# Patient Record
Sex: Female | Born: 1955 | Race: Black or African American | Hispanic: No | Marital: Single | Smoking: Never smoker
Health system: Southern US, Community
[De-identification: ages and names within clinical notes are randomized; demographics above are authoritative.]

## PROBLEM LIST (undated history)

## (undated) DIAGNOSIS — M1990 Unspecified osteoarthritis, unspecified site: Secondary | ICD-10-CM

## (undated) DIAGNOSIS — R51 Headache: Secondary | ICD-10-CM

## (undated) DIAGNOSIS — I639 Cerebral infarction, unspecified: Secondary | ICD-10-CM

## (undated) DIAGNOSIS — R519 Headache, unspecified: Secondary | ICD-10-CM

## (undated) DIAGNOSIS — I10 Essential (primary) hypertension: Secondary | ICD-10-CM

## (undated) HISTORY — DX: Cerebral infarction, unspecified: I63.9

## (undated) HISTORY — PX: ENDOMETRIAL BIOPSY: PRO73

## (undated) HISTORY — PX: COLPOSCOPY W/ BIOPSY / CURETTAGE: SUR283

## (undated) HISTORY — PX: EYE SURGERY: SHX253

---

## 2012-05-03 ENCOUNTER — Encounter (HOSPITAL_BASED_OUTPATIENT_CLINIC_OR_DEPARTMENT_OTHER): Payer: Self-pay | Admitting: *Deleted

## 2012-05-03 ENCOUNTER — Emergency Department (HOSPITAL_BASED_OUTPATIENT_CLINIC_OR_DEPARTMENT_OTHER)
Admission: EM | Admit: 2012-05-03 | Discharge: 2012-05-03 | Disposition: A | Discharge status: Home / Self Care | Payer: Self-pay | Attending: Emergency Medicine | Admitting: Emergency Medicine

## 2012-05-03 ENCOUNTER — Emergency Department (HOSPITAL_BASED_OUTPATIENT_CLINIC_OR_DEPARTMENT_OTHER): Payer: Self-pay

## 2012-05-03 DIAGNOSIS — S2231XA Fracture of one rib, right side, initial encounter for closed fracture: Secondary | ICD-10-CM

## 2012-05-03 DIAGNOSIS — S298XXA Other specified injuries of thorax, initial encounter: Secondary | ICD-10-CM | POA: Insufficient documentation

## 2012-05-03 DIAGNOSIS — W19XXXA Unspecified fall, initial encounter: Secondary | ICD-10-CM

## 2012-05-03 DIAGNOSIS — S2239XA Fracture of one rib, unspecified side, initial encounter for closed fracture: Secondary | ICD-10-CM | POA: Insufficient documentation

## 2012-05-03 DIAGNOSIS — Y9289 Other specified places as the place of occurrence of the external cause: Secondary | ICD-10-CM | POA: Insufficient documentation

## 2012-05-03 DIAGNOSIS — W108XXA Fall (on) (from) other stairs and steps, initial encounter: Secondary | ICD-10-CM | POA: Insufficient documentation

## 2012-05-03 DIAGNOSIS — Y9389 Activity, other specified: Secondary | ICD-10-CM | POA: Insufficient documentation

## 2012-05-03 LAB — URINALYSIS, ROUTINE W REFLEX MICROSCOPIC
Glucose, UA: NEGATIVE mg/dL
Hgb urine dipstick: NEGATIVE
Ketones, ur: NEGATIVE mg/dL
Protein, ur: NEGATIVE mg/dL
pH: 7 (ref 5.0–8.0)

## 2012-05-03 LAB — URINE MICROSCOPIC-ADD ON

## 2012-05-03 MED ORDER — OXYCODONE-ACETAMINOPHEN 5-325 MG PO TABS: 1.0000 | ORAL_TABLET | Freq: Four times a day (QID) | ORAL | Status: DC | PRN

## 2012-05-03 MED ORDER — OXYCODONE-ACETAMINOPHEN 5-325 MG PO TABS
1.0000 | ORAL_TABLET | Freq: Once | ORAL | Status: AC
  Administered 2012-05-03: 1 via ORAL
  Filled 2012-05-03 (×2): qty 1

## 2012-05-03 MED ORDER — IBUPROFEN 800 MG PO TABS
800.0000 mg | ORAL_TABLET | Freq: Once | ORAL | Status: AC
  Administered 2012-05-03: 800 mg via ORAL
  Filled 2012-05-03: qty 1

## 2012-05-03 NOTE — ED Provider Notes (Signed)
History  This chart was scribed for Sandra Chick, MD by Shari Heritage, ED Scribe. The patient was seen in room MH09/MH09. Patient's care was started at 1747.   CSN: [id number]  Arrival date & time 05/03/12  1647   First MD Initiated Contact with Patient 05/03/12 1747      Chief Complaint  Patient presents with  . Fall    Patient is a 57 y.o. female presenting with fall.  Fall The accident occurred 3 to 5 hours ago. Fall occurred: while descending stairs. She fell from an unknown height. She landed on a hard floor. There was no blood loss. Pain location: right lower back. Pain severity now: moderate to severe. She was ambulatory at the scene. There was no entrapment after the fall. There was no drug use involved in the accident. There was no alcohol use involved in the accident. Pertinent negatives include no vomiting and no headaches. She has tried nothing for the symptoms.    HPI Comments: Sandra [name] is a 57 y.o. female who presents to the Emergency Department complaining of a fall that occurred about 4 hours ago. She is complaining of constant, moderate to severe right rib pain Patient reported to her friend that after she fell, she had some difficulty breathing, but this has improved. She denies head injury, loss of consciousness, seizures, neck pain or vomiting. Patient hasn't taken any medicines for pain. She does not smoke or use alcohol. She has no chronic medical conditions.   History reviewed. No pertinent past medical history.  History reviewed. No pertinent past surgical history.  No family history on file.  History  Substance Use Topics  . Smoking status: Never Smoker   . Smokeless tobacco: Not on file  . Alcohol Use: No    OB History   Grav Para Term Preterm Abortions TAB SAB Ect Mult Living                  Review of Systems  HENT: Negative for neck pain.   Gastrointestinal: Negative for vomiting.  Musculoskeletal: Positive for back pain.  Neurological:  Negative for seizures and headaches.  All other systems reviewed and are negative.    Allergies  Review of patient's allergies indicates no known allergies.  Home Medications  No current outpatient prescriptions on file.  Triage Vitals: BP 196/95  Pulse 71  Temp(Src) 99 F (37.2 C) (Oral)  Resp 20  Wt 123 lb (55.792 kg)  SpO2 100%  Physical Exam  Constitutional: She is oriented to person, place, and time. She appears well-developed and well-nourished.  HENT:  Head: Normocephalic and atraumatic.  Eyes: Conjunctivae and EOM are normal. Pupils are equal, round, and reactive to light.  Neck: Normal range of motion. Neck supple.  Cardiovascular: Normal rate, regular rhythm and normal heart sounds.   Pulmonary/Chest: Breath sounds normal. No respiratory distress. She exhibits no crepitus.  Point tenderness of the right ribs over the mid axillary line.  Abdominal: Soft. There is no tenderness.  Musculoskeletal: She exhibits no edema.  Neurological: She is alert and oriented to person, place, and time.  Skin: Skin is warm and dry.    ED Course  Procedures (including critical care time) DIAGNOSTIC STUDIES: Oxygen Saturation is 100% on room air, normal by my interpretation.    COORDINATION OF CARE: 6:07 PM- Patient informed of current plan for treatment and evaluation and agrees with plan at this time.    Labs Reviewed  URINALYSIS, ROUTINE W REFLEX MICROSCOPIC - Abnormal;  Notable for the following:    Leukocytes, UA MODERATE (*)    All other components within normal limits  URINE MICROSCOPIC-ADD ON - Abnormal; Notable for the following:    Bacteria, UA FEW (*)    All other components within normal limits  URINE CULTURE    Imaging Studies: Dg Ribs Unilateral W/chest Right  05/03/2012  *RADIOLOGY REPORT*  Clinical Data: Fall.  Right axillary pain.  Right rib pain.  RIGHT RIBS AND CHEST - 3+ VIEW  Comparison: Lumbar spine radiographs today.  Findings: No pneumothorax.  The  cardiopericardial silhouette appears mildly enlarged however this may be due to lower lung volumes.  Tortuous thoracic aorta.  No airspace disease.  No pleural effusion.  Faintly visualized right posterior 11th rib fracture.  This is better seen on lumbar spine radiographs.  IMPRESSION: Minimally displaced right lateral 11th rib fracture.  No pneumothorax.  Enlargement of the cardiopericardial silhouette likely secondary to low volumes.   Original Report Authenticated By: Andreas Newport, M.D.    Dg Lumbar Spine Complete  05/03/2012  *RADIOLOGY REPORT*  Clinical Data: Fall.  Right lower axillary rib pain.  Low back pain.  LUMBAR SPINE - COMPLETE 4+ VIEW  Comparison: 05/03/2012 rib series.  Findings:  There is a right lateral 11th rib fracture which is minimally displaced.  This is better seen on this lumbar spine series than on the rib series today.  Vertebral body height is preserved.  Lumbar spinal alignment is anatomic.  Suboptimal oblique views.  No gross pars defect or spondylolisthesis.  Facet joints appear within normal limits.  Lumbosacral junction appears normal.  Mild degenerative disc and facet disease in the lower lumbar spine.  IMPRESSION: Mild lumbar spondylosis.  No acute lumbar spine abnormality.  Minimally displaced right lateral 11th rib fracture.   Original Report Authenticated By: Andreas Newport, M.D.      1. Fall, initial encounter   2. Rib fracture, right, closed, initial encounter       MDM  Pt presenting with right flank and ribcage pain after fall today.  Nondisplaced right fib fracture on xray, no pTx or HTX.  Pt in no acute distress.  Pt treated for pain, given incentive spirometer.  Discharged with strict return precautions.  Pt agreeable with plan.    I personally performed the services described in this documentation, which was scribed in my presence. The recorded information has been reviewed and is accurate.    Sandra Chick, MD 05/03/12 2102

## 2012-05-03 NOTE — ED Notes (Addendum)
Slipped and fell down 4 steps. Pain in her right flank.  BP noted. Daughter states she has not taken any BP medication x 3 months after coming here from [place].

## 2012-05-05 LAB — URINE CULTURE

## 2014-12-07 ENCOUNTER — Encounter (HOSPITAL_BASED_OUTPATIENT_CLINIC_OR_DEPARTMENT_OTHER): Payer: Self-pay | Admitting: *Deleted

## 2014-12-07 ENCOUNTER — Emergency Department (HOSPITAL_COMMUNITY): Payer: 59

## 2014-12-07 ENCOUNTER — Emergency Department (HOSPITAL_BASED_OUTPATIENT_CLINIC_OR_DEPARTMENT_OTHER): Payer: 59

## 2014-12-07 ENCOUNTER — Inpatient Hospital Stay (HOSPITAL_BASED_OUTPATIENT_CLINIC_OR_DEPARTMENT_OTHER)
Admission: EM | Admit: 2014-12-07 | Discharge: 2014-12-09 | DRG: 065 | Disposition: A | Discharge status: Home / Self Care | Payer: 59 | Attending: Internal Medicine | Admitting: Internal Medicine

## 2014-12-07 DIAGNOSIS — R531 Weakness: Secondary | ICD-10-CM | POA: Diagnosis not present

## 2014-12-07 DIAGNOSIS — I10 Essential (primary) hypertension: Secondary | ICD-10-CM | POA: Diagnosis present

## 2014-12-07 DIAGNOSIS — E785 Hyperlipidemia, unspecified: Secondary | ICD-10-CM | POA: Insufficient documentation

## 2014-12-07 DIAGNOSIS — I6350 Cerebral infarction due to unspecified occlusion or stenosis of unspecified cerebral artery: Secondary | ICD-10-CM

## 2014-12-07 DIAGNOSIS — M6289 Other specified disorders of muscle: Secondary | ICD-10-CM | POA: Diagnosis not present

## 2014-12-07 DIAGNOSIS — I639 Cerebral infarction, unspecified: Principal | ICD-10-CM | POA: Diagnosis present

## 2014-12-07 DIAGNOSIS — R29810 Facial weakness: Secondary | ICD-10-CM | POA: Diagnosis present

## 2014-12-07 DIAGNOSIS — G8194 Hemiplegia, unspecified affecting left nondominant side: Secondary | ICD-10-CM | POA: Diagnosis present

## 2014-12-07 DIAGNOSIS — I669 Occlusion and stenosis of unspecified cerebral artery: Secondary | ICD-10-CM | POA: Diagnosis present

## 2014-12-07 HISTORY — DX: Essential (primary) hypertension: I10

## 2014-12-07 HISTORY — DX: Unspecified osteoarthritis, unspecified site: M19.90

## 2014-12-07 HISTORY — DX: Headache: R51

## 2014-12-07 HISTORY — DX: Headache, unspecified: R51.9

## 2014-12-07 LAB — URINALYSIS, ROUTINE W REFLEX MICROSCOPIC
BILIRUBIN URINE: NEGATIVE
GLUCOSE, UA: NEGATIVE mg/dL
Hgb urine dipstick: NEGATIVE
KETONES UR: NEGATIVE mg/dL
NITRITE: NEGATIVE
PH: 7.5 (ref 5.0–8.0)
PROTEIN: NEGATIVE mg/dL
Specific Gravity, Urine: 1.006 (ref 1.005–1.030)

## 2014-12-07 LAB — COMPREHENSIVE METABOLIC PANEL
ALT: 17 U/L (ref 14–54)
AST: 25 U/L (ref 15–41)
Albumin: 4.2 g/dL (ref 3.5–5.0)
Alkaline Phosphatase: 64 U/L (ref 38–126)
Anion gap: 6 (ref 5–15)
BILIRUBIN TOTAL: 0.7 mg/dL (ref 0.3–1.2)
BUN: 10 mg/dL (ref 6–20)
CHLORIDE: 102 mmol/L (ref 101–111)
CO2: 26 mmol/L (ref 22–32)
Calcium: 9.4 mg/dL (ref 8.9–10.3)
Creatinine, Ser: 0.79 mg/dL (ref 0.44–1.00)
Glucose, Bld: 126 mg/dL — ABNORMAL HIGH (ref 65–99)
POTASSIUM: 4.4 mmol/L (ref 3.5–5.1)
Sodium: 134 mmol/L — ABNORMAL LOW (ref 135–145)
TOTAL PROTEIN: 9.3 g/dL — AB (ref 6.5–8.1)

## 2014-12-07 LAB — CBC
HEMATOCRIT: 40 % (ref 36.0–46.0)
Hemoglobin: 13.2 g/dL (ref 12.0–15.0)
MCH: 28.8 pg (ref 26.0–34.0)
MCHC: 33 g/dL (ref 30.0–36.0)
MCV: 87.3 fL (ref 78.0–100.0)
PLATELETS: 201 10*3/uL (ref 150–400)
RBC: 4.58 MIL/uL (ref 3.87–5.11)
RDW: 12.9 % (ref 11.5–15.5)
WBC: 7.7 10*3/uL (ref 4.0–10.5)

## 2014-12-07 LAB — URINE MICROSCOPIC-ADD ON

## 2014-12-07 LAB — CBG MONITORING, ED: GLUCOSE-CAPILLARY: 108 mg/dL — AB (ref 65–99)

## 2014-12-07 LAB — TROPONIN I

## 2014-12-07 MED ORDER — ASPIRIN EC 81 MG PO TBEC
81.0000 mg | DELAYED_RELEASE_TABLET | Freq: Every day | ORAL | Status: DC
  Administered 2014-12-07 – 2014-12-09 (×3): 81 mg via ORAL
  Filled 2014-12-07 (×3): qty 1

## 2014-12-07 MED ORDER — HYDROCHLOROTHIAZIDE 25 MG PO TABS
25.0000 mg | ORAL_TABLET | Freq: Once | ORAL | Status: AC
  Administered 2014-12-07: 25 mg via ORAL
  Filled 2014-12-07: qty 1

## 2014-12-07 MED ORDER — GADOBENATE DIMEGLUMINE 529 MG/ML IV SOLN
10.0000 mL | Freq: Once | INTRAVENOUS | Status: AC | PRN
  Administered 2014-12-07: 10 mL via INTRAVENOUS

## 2014-12-07 MED ORDER — ASPIRIN EC 81 MG PO TBEC: 81.0000 mg | DELAYED_RELEASE_TABLET | Freq: Every day | ORAL | Status: DC

## 2014-12-07 NOTE — ED Provider Notes (Signed)
CSN: [id number]     Arrival date & time 12/07/14  1426 History   First MD Initiated Contact with Patient 12/07/14 1503     Chief Complaint  Patient presents with  . Weakness     (Consider location/radiation/quality/duration/timing/severity/associated sxs/prior Treatment) Patient is a 59 y.o. female presenting with Acute Neurological Problem. The history is provided by a relative and medical records. The history is limited by a language barrier. Language interpreter used: family.  Cerebrovascular Accident This is a new problem. Episode onset: 4 days ago. The problem occurs constantly. The problem has not changed since onset.Pertinent negatives include no chest pain, no abdominal pain, no headaches and no shortness of breath. Nothing aggravates the symptoms. Nothing relieves the symptoms. She has tried nothing for the symptoms. The treatment provided no relief.    History reviewed. No pertinent past medical history. History reviewed. No pertinent past surgical history. No family history on file. Social History  Substance Use Topics  . Smoking status: Never Smoker   . Smokeless tobacco: None  . Alcohol Use: No   OB History    No data available     Review of Systems  Constitutional: Negative for fever.  Eyes: Negative for visual disturbance.  Respiratory: Negative for shortness of breath.   Cardiovascular: Negative for chest pain.  Gastrointestinal: Negative for nausea, vomiting and abdominal pain.  Skin: Negative for rash.  Neurological: Positive for dizziness and weakness. Negative for syncope, facial asymmetry, numbness and headaches.      Allergies  Review of patient's allergies indicates no known allergies.  Home Medications   Prior to Admission medications   Medication Sig Start Date End Date Taking? Authorizing Provider  Multiple Vitamins-Calcium (ONE-A-DAY WOMENS PO) Take 1 tablet by mouth daily.   Yes Historical Provider, MD   BP 148/80 mmHg  Pulse 65   Temp(Src) 98.1 F (36.7 C) (Oral)  Resp 19  Ht 5\' 2"  (1.575 m)  Wt 122 lb (55.339 kg)  BMI 22.31 kg/m2  SpO2 100% Physical Exam  Constitutional: She is oriented to person, place, and time. She appears well-developed and well-nourished. No distress.  HENT:  Head: Normocephalic and atraumatic.  Eyes: Conjunctivae and EOM are normal.  Neck: Normal range of motion.  Cardiovascular: Normal rate, regular rhythm, normal heart sounds and intact distal pulses.  Exam reveals no gallop and no friction rub.   No murmur heard. Pulmonary/Chest: Effort normal and breath sounds normal. No respiratory distress. She has no wheezes. She has no rales.  Abdominal: Soft. She exhibits no distension. There is no tenderness. There is no guarding.  Musculoskeletal: She exhibits no edema or tenderness.  Neurological: She is alert and oriented to person, place, and time.  Skin: Skin is warm and dry. No rash noted. She is not diaphoretic. No erythema.  Nursing note and vitals reviewed.   ED Course  Procedures (including critical care time) Labs Review Labs Reviewed  COMPREHENSIVE METABOLIC PANEL - Abnormal; Notable for the following:    Sodium 134 (*)    Glucose, Bld 126 (*)    Total Protein 9.3 (*)    All other components within normal limits  URINALYSIS, ROUTINE W REFLEX MICROSCOPIC (NOT AT [facility]) - Abnormal; Notable for the following:    Leukocytes, UA TRACE (*)    All other components within normal limits  URINE MICROSCOPIC-ADD ON - Abnormal; Notable for the following:    Squamous Epithelial / LPF 0-5 (*)    Bacteria, UA RARE (*)    All other  components within normal limits  LIPID PANEL - Abnormal; Notable for the following:    Cholesterol 231 (*)    HDL 35 (*)    LDL Cholesterol 170 (*)    All other components within normal limits  CBG MONITORING, ED - Abnormal; Notable for the following:    Glucose-Capillary 108 (*)    All other components within normal limits  CBC  TROPONIN I  HEMOGLOBIN  A1C    Imaging Review Dg Chest 2 View  12/07/2014  CLINICAL DATA:  Dizziness, weakness left side of the body for 4 days EXAM: CHEST  2 VIEW COMPARISON:  None. FINDINGS: Cardiomediastinal silhouette is unremarkable. No acute infiltrate or pleural effusion. No pulmonary edema. Bony thorax is unremarkable. IMPRESSION: No active cardiopulmonary disease. Electronically Signed   By: Lahoma Crocker M.D.   On: 12/07/2014 16:12   Ct Head Wo Contrast  12/07/2014  CLINICAL DATA:  Dizziness, weakness, left side. EXAM: CT HEAD WITHOUT CONTRAST TECHNIQUE: Contiguous axial images were obtained from the base of the skull through the vertex without intravenous contrast. COMPARISON:  None. FINDINGS: There is no evidence of mass effect, midline shift, or extra-axial fluid collections. There is no evidence of a space-occupying lesion or intracranial hemorrhage. There is no evidence of a cortical-based area of acute infarction. There is generalized cerebral atrophy. There is periventricular white matter low attenuation likely secondary to microangiopathy. The ventricles and sulci are appropriate for the patient's age. The basal cisterns are patent. Visualized portions of the orbits are unremarkable. The visualized portions of the paranasal sinuses and mastoid air cells are unremarkable. The osseous structures are unremarkable. IMPRESSION: No acute intracranial pathology. Electronically Signed   By: Kathreen Devoid   On: 12/07/2014 16:14   [name] Head Wo Contrast  12/07/2014  CLINICAL DATA:  Four-day history of gradual onset LEFT extremity and facial weakness, dizziness, imbalance and transient slurred speech. Similar symptoms 2 months ago which resolved. EXAM: MRI HEAD WITHOUT AND WITH CONTRAST MRA HEAD WITHOUT CONTRAST TECHNIQUE: Multiplanar, multiecho pulse sequences of the brain and surrounding structures were obtained without and with intravenous contrast. Angiographic images of the head were obtained using MRA technique without  contrast. CONTRAST:  62mL MULTIHANCE GADOBENATE DIMEGLUMINE 529 MG/ML IV SOLN COMPARISON:  CT head December 07, 2014 at 1556 hours FINDINGS: MRI HEAD FINDINGS 9 mm ovoid focus of reduced diffusion posterior limb of RIGHT internal capsule, corresponding low ADC values. Faint associated FLAIR T2 hyperintense signal, no associated enhancement. The ventricles and sulci are normal for patient's age, cavum septum pellucidum et vergae, normal variant. No abnormal parenchymal signal, mass lesions, mass effect. Faint T2 hyperintense signal within the pons. No abnormal parenchymal enhancement. No susceptibility artifact to suggest hemorrhage. No abnormal extra-axial fluid collections. No extra-axial masses nor leptomeningeal enhancement. Normal major intracranial vascular flow voids seen at the skull base. Ocular globes and orbital contents are unremarkable though not tailored for evaluation. No suspicious calvarial bone marrow signal. Mildly expanded fluid filled sella compatible with empty sella, thin rind of pituitary tissue along the floor. Craniocervical junction maintained. Mild LEFT maxillary sinus mucosal thickening without paranasal sinus air-fluid levels. The mastoid air cells are well aerated. MRA HEAD FINDINGS Moderately motion degraded examination. Anterior circulation: Normal flow related enhancement of the included cervical, petrous, cavernous and supra clinoid internal carotid arteries. Patent anterior communicating artery. Normal flow related enhancement of the anterior and middle cerebral arteries, including more distal segments. Moderate stenosis distal RIGHT M1 segment. No large vessel occlusion, high-grade stenosis,  abnormal luminal irregularity, aneurysm. Posterior circulation: LEFT vertebral artery is dominant. Basilar artery is patent, with normal flow related enhancement of the main branch vessels. Fetal origin bilateral posterior cerebral arteries. LEFT posterior communicating artery origin  infundibulum. Normal flow related enhancement of the posterior cerebral arteries. No large vessel occlusion, high-grade stenosis, abnormal luminal irregularity, aneurysm. IMPRESSION: MRI HEAD: Acute subcentimeter infarct RIGHT posterior limb of the internal capsule (lenticulostriate territory, typically arising from M1). Mild pontine white matter changes compatible with chronic small vessel ischemic disease. Empty sella. MRA HEAD: Moderate stenosis RIGHT M1 segment. No acute large vessel occlusion. Complete circle of Willis. Electronically Signed   By: Elon Alas M.D.   On: 12/07/2014 21:05   Mr Jeri Cos [id number] Contrast  12/07/2014  CLINICAL DATA:  Four-day history of gradual onset LEFT extremity and facial weakness, dizziness, imbalance and transient slurred speech. Similar symptoms 2 months ago which resolved. EXAM: MRI HEAD WITHOUT AND WITH CONTRAST MRA HEAD WITHOUT CONTRAST TECHNIQUE: Multiplanar, multiecho pulse sequences of the brain and surrounding structures were obtained without and with intravenous contrast. Angiographic images of the head were obtained using MRA technique without contrast. CONTRAST:  12mL MULTIHANCE GADOBENATE DIMEGLUMINE 529 MG/ML IV SOLN COMPARISON:  CT head December 07, 2014 at 1556 hours FINDINGS: MRI HEAD FINDINGS 9 mm ovoid focus of reduced diffusion posterior limb of RIGHT internal capsule, corresponding low ADC values. Faint associated FLAIR T2 hyperintense signal, no associated enhancement. The ventricles and sulci are normal for patient's age, cavum septum pellucidum et vergae, normal variant. No abnormal parenchymal signal, mass lesions, mass effect. Faint T2 hyperintense signal within the pons. No abnormal parenchymal enhancement. No susceptibility artifact to suggest hemorrhage. No abnormal extra-axial fluid collections. No extra-axial masses nor leptomeningeal enhancement. Normal major intracranial vascular flow voids seen at the skull base. Ocular globes and orbital  contents are unremarkable though not tailored for evaluation. No suspicious calvarial bone marrow signal. Mildly expanded fluid filled sella compatible with empty sella, thin rind of pituitary tissue along the floor. Craniocervical junction maintained. Mild LEFT maxillary sinus mucosal thickening without paranasal sinus air-fluid levels. The mastoid air cells are well aerated. MRA HEAD FINDINGS Moderately motion degraded examination. Anterior circulation: Normal flow related enhancement of the included cervical, petrous, cavernous and supra clinoid internal carotid arteries. Patent anterior communicating artery. Normal flow related enhancement of the anterior and middle cerebral arteries, including more distal segments. Moderate stenosis distal RIGHT M1 segment. No large vessel occlusion, high-grade stenosis, abnormal luminal irregularity, aneurysm. Posterior circulation: LEFT vertebral artery is dominant. Basilar artery is patent, with normal flow related enhancement of the main branch vessels. Fetal origin bilateral posterior cerebral arteries. LEFT posterior communicating artery origin infundibulum. Normal flow related enhancement of the posterior cerebral arteries. No large vessel occlusion, high-grade stenosis, abnormal luminal irregularity, aneurysm. IMPRESSION: MRI HEAD: Acute subcentimeter infarct RIGHT posterior limb of the internal capsule (lenticulostriate territory, typically arising from M1). Mild pontine white matter changes compatible with chronic small vessel ischemic disease. Empty sella. MRA HEAD: Moderate stenosis RIGHT M1 segment. No acute large vessel occlusion. Complete circle of Willis. Electronically Signed   By: Elon Alas M.D.   On: 12/07/2014 21:05   I have personally reviewed and evaluated these images and lab results as part of my medical decision-making.   EKG Interpretation   Date/Time:  Thursday December 07 2014 15:38:30 EST Ventricular Rate:  67 PR Interval:  160 QRS  Duration: 78 QT Interval:  390 QTC Calculation: 412 R Axis:   16 Text  Interpretation:  Normal sinus rhythm Nonspecific ST abnormality  Abnormal ECG No previous ECGs available Confirmed by NGUYEN, EMILY ([id number])  on 12/07/2014 3:43:01 PM        MDM   Final diagnoses:  Cerebrovascular accident (CVA) due to occlusion of cerebral artery [facility])   58 year old female who presents as transfer from [facility] for concern of left-sided weakness of 4 days duration. CT head was unremarkable. Dr. Alfonse Spruce consulted neurology, Dr. Sadie Haber, who had recommended patient come to the emergency department. Consulted neurology on arrival, and MRI was ordered which showed concern for acute subcentimeter infarct of the right posterior limb of the internal capsule.  Patient was admitted to hospitalists for concern of acute CVA.   Gareth Morgan, MD 12/08/14 [phone]

## 2014-12-07 NOTE — ED Notes (Signed)
Comer Locket [id number] Pt daughter  Angelica Ran [phone] Pt niece

## 2014-12-07 NOTE — ED Notes (Signed)
Pt states now that left side weakness started 4 days ago

## 2014-12-07 NOTE — ED Notes (Signed)
Pt arrives via [facility] from [facility]  with c/o neuro symptoms. Pt family reports several days of left sided weakness with pt dropping items from left hand today. They took her to the [facility] where she had a workup and negative CT.Pt here for further work up. Pt arrives alert and oriented(per family). Pt only speaks [place] but is responsive to command.

## 2014-12-07 NOTE — ED Notes (Signed)
Pt speaks Sandra [name]

## 2014-12-07 NOTE — ED Notes (Signed)
MD at bedside. 

## 2014-12-07 NOTE — ED Provider Notes (Signed)
CSN: [id number]     Arrival date & time 12/07/14  1426 History   First MD Initiated Contact with Patient 12/07/14 1503     Chief Complaint  Patient presents with  . Weakness     (Consider location/radiation/quality/duration/timing/severity/associated sxs/prior Treatment) HPI Comments: 59 y.o. Female with no known past medical history presents with her daughter for left sided weakness.  The patient and her daughter report that since Monday the patient has had weakness on the left side of her body.  Her daughter noted yesterday that her mother could not hold food in her left hand and that it was slipping out of her hand.  She denies headache or symptoms like this before.  She also has felt off balance when walking and weakness in the left leg.  Daughter has not noted change in speech or appearance of patient's face or smile and patient denies either as well.  The patient has felt dizzy at times as well.  No fever, chills, chest pain, shortness of breath, nausea, vomiting.  Patient is a 59 y.o. female presenting with weakness.  Weakness Pertinent negatives include no chest pain, no abdominal pain, no headaches and no shortness of breath.    History reviewed. No pertinent past medical history. History reviewed. No pertinent past surgical history. No family history on file. Social History  Substance Use Topics  . Smoking status: Never Smoker   . Smokeless tobacco: None  . Alcohol Use: No   OB History    No data available     Review of Systems  Constitutional: Negative for fever, chills, appetite change and fatigue.  HENT: Negative for congestion, postnasal drip and rhinorrhea.   Respiratory: Negative for cough, chest tightness and shortness of breath.   Cardiovascular: Negative for chest pain, palpitations and leg swelling.  Gastrointestinal: Negative for nausea, abdominal pain and diarrhea.  Genitourinary: Negative for dysuria, urgency, frequency and hematuria.  Musculoskeletal:  Negative for myalgias, back pain, neck pain and neck stiffness.  Skin: Negative for rash.  Neurological: Positive for dizziness, weakness (left arm and leg) and numbness. Negative for seizures, syncope, facial asymmetry, light-headedness and headaches.  Hematological: Does not bruise/bleed easily.      Allergies  Review of patient's allergies indicates no known allergies.  Home Medications   Prior to Admission medications   Not on File   BP 171/89 mmHg  Pulse 65  Temp(Src) 99.5 F (37.5 C) (Oral)  Resp 26  Ht 4\' 11"  (1.499 m)  Wt 122 lb (55.339 kg)  BMI 24.63 kg/m2  SpO2 100% Physical Exam  Constitutional: She is oriented to person, place, and time. She appears well-developed and well-nourished. No distress.  HENT:  Head: Normocephalic and atraumatic.  Right Ear: External ear normal.  Left Ear: External ear normal.  Nose: Nose normal.  Mouth/Throat: Oropharynx is clear and moist. No oropharyngeal exudate.  Eyes: EOM are normal. Pupils are equal, round, and reactive to light.  Neck: Normal range of motion. Neck supple.  Cardiovascular: Normal rate, regular rhythm, normal heart sounds and intact distal pulses.   No murmur heard. Pulmonary/Chest: Effort normal. No respiratory distress. She has no wheezes. She has no rales.  Abdominal: Soft. She exhibits no distension. There is no tenderness.  Musculoskeletal: Normal range of motion. She exhibits no edema or tenderness.  Neurological: She is alert and oriented to person, place, and time. A sensory deficit (reports decreased sensation in left arm and leg compared to right) is present. No cranial nerve deficit. She exhibits  normal muscle tone. Coordination abnormal.  Left hand grasp and plantar flexion slightly decreased from right.  Patient with mild left arm drift.  Patient had difficulty with finger to nose examination when using her left arm.   Skin: Skin is warm and dry. No rash noted. She is not diaphoretic.  Vitals  reviewed.   ED Course  Procedures (including critical care time) Labs Review Labs Reviewed  COMPREHENSIVE METABOLIC PANEL - Abnormal; Notable for the following:    Sodium 134 (*)    Glucose, Bld 126 (*)    Total Protein 9.3 (*)    All other components within normal limits  CBG MONITORING, ED - Abnormal; Notable for the following:    Glucose-Capillary 108 (*)    All other components within normal limits  CBC  TROPONIN I  URINALYSIS, ROUTINE W REFLEX MICROSCOPIC (NOT AT [facility])    Imaging Review Dg Chest 2 View  12/07/2014  CLINICAL DATA:  Dizziness, weakness left side of the body for 4 days EXAM: CHEST  2 VIEW COMPARISON:  None. FINDINGS: Cardiomediastinal silhouette is unremarkable. No acute infiltrate or pleural effusion. No pulmonary edema. Bony thorax is unremarkable. IMPRESSION: No active cardiopulmonary disease. Electronically Signed   By: Lahoma Crocker M.D.   On: 12/07/2014 16:12   Ct Head Wo Contrast  12/07/2014  CLINICAL DATA:  Dizziness, weakness, left side. EXAM: CT HEAD WITHOUT CONTRAST TECHNIQUE: Contiguous axial images were obtained from the base of the skull through the vertex without intravenous contrast. COMPARISON:  None. FINDINGS: There is no evidence of mass effect, midline shift, or extra-axial fluid collections. There is no evidence of a space-occupying lesion or intracranial hemorrhage. There is no evidence of a cortical-based area of acute infarction. There is generalized cerebral atrophy. There is periventricular white matter low attenuation likely secondary to microangiopathy. The ventricles and sulci are appropriate for the patient's age. The basal cisterns are patent. Visualized portions of the orbits are unremarkable. The visualized portions of the paranasal sinuses and mastoid air cells are unremarkable. The osseous structures are unremarkable. IMPRESSION: No acute intracranial pathology. Electronically Signed   By: Kathreen Devoid   On: 12/07/2014 16:14   I have  personally reviewed and evaluated these images and lab results as part of my medical decision-making.   EKG Interpretation   Date/Time:  Thursday December 07 2014 15:38:30 EST Ventricular Rate:  67 PR Interval:  160 QRS Duration: 78 QT Interval:  390 QTC Calculation: 412 R Axis:   16 Text Interpretation:  Normal sinus rhythm Nonspecific ST abnormality  Abnormal ECG No previous ECGs available Confirmed by NGUYEN, EMILY ([id number])  on 12/07/2014 3:43:01 PM      MDM  Patient seen and evaluated in stable condition.  Noted to have left sided weakness on examination.  Concern for possible CVA although today 4th day of symptoms.  Initial work up unremarkable.  Discussed with Dr. Merlene Laughter who recommended an ER to ER transfer and said that neurology would see and evaluate the patient in the ER as deficits not extreme and onset few days ago.  Discussed with Dr. Billy Fischer who agreed with transfer and patient transferred in stable condition.  Patient and family updated on results and plan of care. Final diagnoses:  Left-sided weakness    1. Left sided weakness, concern for CVA    Harvel Quale, MD 12/07/14 [phone]

## 2014-12-07 NOTE — Consult Note (Addendum)
Referring Physician: ED    Chief Complaint: left sided weakness, dizziness, unsteadiness x 4 days  HPI:                                                                                                                                         Sandra [name] is an 59 y.o. female, right handed, with a past medical history significant for HTN, transfer to [facility] for further evaluation of the above stated symptoms. Patient initially presented to [facility] whre she had a CT brain that sowed no acute abnormality and was send to [facility] for neuro evaluation. She is accompanied by a family member that is at the bedside and helps with translation. Patient said that she developed gradual onset of left leg-arm-face weakness 4 days ago, as well as dizziness, imbalance, and transient slurred speech. She stated that she had similar symptoms 2 months ago that resolved 24 hours later. Complains of mild HA but denies double vision, difficulty swallowing, language or vision impairment. No neck pain, fever, or palpitations.  Date last known well: unacertain  Time last known well: uncertain tPA Given: no, late presentation   History reviewed. No pertinent past medical history.  History reviewed. No pertinent past surgical history.  No family history on file. Social History:  reports that she has never smoked. She does not have any smokeless tobacco history on file. She reports that she does not drink alcohol or use illicit drugs.  Allergies: No Known Allergies  Medications:                                                                                                                           I have reviewed the patient's current medications.  ROS:  History obtained from chart review and the patient  General ROS: negative for - chills, fatigue, fever, night sweats, weight gain or  weight loss Psychological ROS: negative for - behavioral disorder, hallucinations, memory difficulties, mood swings or suicidal ideation Ophthalmic ROS: negative for - blurry vision, double vision, eye pain or loss of vision ENT ROS: negative for - epistaxis, nasal discharge, oral lesions, sore throat, tinnitus or vertigo Allergy and Immunology ROS: negative for - hives or itchy/watery eyes Hematological and Lymphatic ROS: negative for - bleeding problems, bruising or swollen lymph nodes Endocrine ROS: negative for - galactorrhea, hair pattern changes, polydipsia/polyuria or temperature intolerance Respiratory ROS: negative for - cough, hemoptysis, shortness of breath or wheezing Cardiovascular ROS: negative for - chest pain, dyspnea on exertion, edema or irregular heartbeat Gastrointestinal ROS: negative for - abdominal pain, diarrhea, hematemesis, nausea/vomiting or stool incontinence Genito-Urinary ROS: negative for - dysuria, hematuria, incontinence or urinary frequency/urgency Musculoskeletal ROS: negative for - joint swelling Neurological ROS: as noted in HPI Dermatological ROS: negative for rash and skin lesion changes     Physical exam:  Constitutional: well developed, pleasant female in no apparent distress. Blood pressure 165/116, pulse 58, temperature 99.3 F (37.4 C), temperature source Oral, resp. rate 16, height '5\' 2"'  (1.575 m), weight 55.339 kg (122 lb), SpO2 98 %. Eyes: no jaundice or exophthalmos.  Head: normocephalic. Neck: supple, no bruits, no JVD. Cardiac: no murmurs. Lungs: clear. Abdomen: soft, no tender, no mass. Extremities: no edema, clubbing, or cyanosis.  Skin: no rash  Neurologic Examination:                                                                                                      General: NAD Mental Status: Alert, oriented, thought content appropriate.  Speech fluent without evidence of aphasia.  Able to follow 3 step commands without  difficulty. Cranial Nerves: II: Discs flat bilaterally; Visual fields grossly normal, pupils equal, round, reactive to light and accommodation III,IV, VI: ptosis not present, extra-ocular motions intact bilaterally V,VII: smile asymmetric due to mild left lower face weakness, facial light touch sensation normal bilaterally VIII: hearing normal bilaterally IX,X: uvula rises symmetrically XI: bilateral shoulder shrug XII: midline tongue extension without atrophy or fasciculations Motor: Subtle decreased RAM left hand. Tone and bulk:normal tone throughout; no atrophy noted Sensory: Pinprick and light touch intact throughout, bilaterally Deep Tendon Reflexes:  1+ all over Plantars: Right: downgoing   Left: downgoing Cerebellar: normal finger-to-nose and normal heel-to-shin test in the right. Mildly impaired heel to shin in the left but ok FTN. Gait:  No tested due to multiple leads    Results for orders placed or performed during the hospital encounter of 12/07/14 (from the past 48 hour(s))  CBC     Status: None   Collection Time: 12/07/14  2:55 PM  Result Value Ref Range   WBC 7.7 4.0 - 10.5 K/uL   RBC 4.58 3.87 - 5.11 MIL/uL   Hemoglobin 13.2 12.0 - 15.0 g/dL   HCT 40.0 36.0 - 46.0 %   MCV 87.3 78.0 - 100.0 fL  MCH 28.8 26.0 - 34.0 pg   MCHC 33.0 30.0 - 36.0 g/dL   RDW 12.9 11.5 - 15.5 %   Platelets 201 150 - 400 K/uL  Comprehensive metabolic panel     Status: Abnormal   Collection Time: 12/07/14  2:55 PM  Result Value Ref Range   Sodium 134 (L) 135 - 145 mmol/L   Potassium 4.4 3.5 - 5.1 mmol/L   Chloride 102 101 - 111 mmol/L   CO2 26 22 - 32 mmol/L   Glucose, Bld 126 (H) 65 - 99 mg/dL   BUN 10 6 - 20 mg/dL   Creatinine, Ser 0.79 0.44 - 1.00 mg/dL   Calcium 9.4 8.9 - 10.3 mg/dL   Total Protein 9.3 (H) 6.5 - 8.1 g/dL   Albumin 4.2 3.5 - 5.0 g/dL   AST 25 15 - 41 U/L   ALT 17 14 - 54 U/L   Alkaline Phosphatase 64 38 - 126 U/L   Total Bilirubin 0.7 0.3 - 1.2 mg/dL    GFR calc non Af Amer >60 >60 mL/min   GFR calc Af Amer >60 >60 mL/min    Comment: (NOTE) The eGFR has been calculated using the CKD EPI equation. This calculation has not been validated in all clinical situations. eGFR's persistently <60 mL/min signify possible Chronic Kidney Disease.    Anion gap 6 5 - 15  Troponin I     Status: None   Collection Time: 12/07/14  2:55 PM  Result Value Ref Range   Troponin I <0.03 <0.031 ng/mL    Comment:        NO INDICATION OF MYOCARDIAL INJURY.   POC CBG, ED     Status: Abnormal   Collection Time: 12/07/14  2:57 PM  Result Value Ref Range   Glucose-Capillary 108 (H) 65 - 99 mg/dL  Urinalysis, Routine w reflex microscopic (not at [facility])     Status: Abnormal   Collection Time: 12/07/14  4:45 PM  Result Value Ref Range   Color, Urine YELLOW YELLOW   APPearance CLEAR CLEAR   Specific Gravity, Urine 1.006 1.005 - 1.030   pH 7.5 5.0 - 8.0   Glucose, UA NEGATIVE NEGATIVE mg/dL   Hgb urine dipstick NEGATIVE NEGATIVE   Bilirubin Urine NEGATIVE NEGATIVE   Ketones, ur NEGATIVE NEGATIVE mg/dL   Protein, ur NEGATIVE NEGATIVE mg/dL   Nitrite NEGATIVE NEGATIVE   Leukocytes, UA TRACE (A) NEGATIVE  Urine microscopic-add on     Status: Abnormal   Collection Time: 12/07/14  4:45 PM  Result Value Ref Range   Squamous Epithelial / LPF 0-5 (A) NONE SEEN   WBC, UA 0-5 0 - 5 WBC/hpf   RBC / HPF 0-5 0 - 5 RBC/hpf   Bacteria, UA RARE (A) NONE SEEN   Urine-Other MUCOUS PRESENT    Dg Chest 2 View  12/07/2014  CLINICAL DATA:  Dizziness, weakness left side of the body for 4 days EXAM: CHEST  2 VIEW COMPARISON:  None. FINDINGS: Cardiomediastinal silhouette is unremarkable. No acute infiltrate or pleural effusion. No pulmonary edema. Bony thorax is unremarkable. IMPRESSION: No active cardiopulmonary disease. Electronically Signed   By: Lahoma Crocker M.D.   On: 12/07/2014 16:12   Ct Head Wo Contrast  12/07/2014  CLINICAL DATA:  Dizziness, weakness, left side. EXAM: CT  HEAD WITHOUT CONTRAST TECHNIQUE: Contiguous axial images were obtained from the base of the skull through the vertex without intravenous contrast. COMPARISON:  None. FINDINGS: There is no evidence of mass effect, midline  shift, or extra-axial fluid collections. There is no evidence of a space-occupying lesion or intracranial hemorrhage. There is no evidence of a cortical-based area of acute infarction. There is generalized cerebral atrophy. There is periventricular white matter low attenuation likely secondary to microangiopathy. The ventricles and sulci are appropriate for the patient's age. The basal cisterns are patent. Visualized portions of the orbits are unremarkable. The visualized portions of the paranasal sinuses and mastoid air cells are unremarkable. The osseous structures are unremarkable. IMPRESSION: No acute intracranial pathology. Electronically Signed   By: Kathreen Devoid   On: 12/07/2014 16:14    Assessment: 59 y.o. female transfer to [facility] for evaluation of left sided weakness, dizziness, imbalance x 4 days. Subtle decreased rapid alternating movements left hand, mild lef lower face weakness, perhaps slightly impaired left heel to shin. CT brain without acute abnormality. Concern for right subcortical subacute infarct in spite of CT brain negative 4 days after onset of symptoms. Admit to medicine. Ordered MRI/MRA brain to better elucidate her syndrome. Further stroke work up if MRI +.  PT. ASA. Will follow up.   Stroke Risk Factors - HTN    Dorian Pod, MD [facility] [phone]  12/07/2014, 7:30 PM 9. PT/OT SLP

## 2014-12-07 NOTE — ED Notes (Signed)
Dizziness. Weakness on the left side of her body since yesterday.

## 2014-12-07 NOTE — ED Notes (Signed)
Patient transported to MRI 

## 2014-12-08 ENCOUNTER — Encounter (HOSPITAL_COMMUNITY): Payer: Self-pay | Admitting: *Deleted

## 2014-12-08 ENCOUNTER — Inpatient Hospital Stay (HOSPITAL_COMMUNITY): Payer: 59

## 2014-12-08 DIAGNOSIS — M6289 Other specified disorders of muscle: Secondary | ICD-10-CM | POA: Diagnosis not present

## 2014-12-08 DIAGNOSIS — I6350 Cerebral infarction due to unspecified occlusion or stenosis of unspecified cerebral artery: Secondary | ICD-10-CM | POA: Insufficient documentation

## 2014-12-08 DIAGNOSIS — G8194 Hemiplegia, unspecified affecting left nondominant side: Secondary | ICD-10-CM | POA: Diagnosis present

## 2014-12-08 DIAGNOSIS — I6789 Other cerebrovascular disease: Secondary | ICD-10-CM

## 2014-12-08 DIAGNOSIS — I63311 Cerebral infarction due to thrombosis of right middle cerebral artery: Secondary | ICD-10-CM | POA: Diagnosis not present

## 2014-12-08 DIAGNOSIS — E785 Hyperlipidemia, unspecified: Secondary | ICD-10-CM | POA: Diagnosis present

## 2014-12-08 DIAGNOSIS — I10 Essential (primary) hypertension: Secondary | ICD-10-CM | POA: Diagnosis present

## 2014-12-08 DIAGNOSIS — R531 Weakness: Secondary | ICD-10-CM | POA: Diagnosis present

## 2014-12-08 DIAGNOSIS — R29810 Facial weakness: Secondary | ICD-10-CM | POA: Diagnosis present

## 2014-12-08 DIAGNOSIS — I639 Cerebral infarction, unspecified: Secondary | ICD-10-CM | POA: Diagnosis present

## 2014-12-08 DIAGNOSIS — I669 Occlusion and stenosis of unspecified cerebral artery: Secondary | ICD-10-CM | POA: Diagnosis present

## 2014-12-08 LAB — VITAMIN B12: VITAMIN B 12: 1282 pg/mL — AB (ref 180–914)

## 2014-12-08 LAB — LIPID PANEL
CHOLESTEROL: 231 mg/dL — AB (ref 0–200)
HDL: 35 mg/dL — AB (ref >40)
LDL Cholesterol: 170 mg/dL — ABNORMAL HIGH (ref 0–99)
TRIGLYCERIDES: 128 mg/dL (ref <150)
Total CHOL/HDL Ratio: 6.6 RATIO
VLDL: 26 mg/dL (ref 0–40)

## 2014-12-08 LAB — HEMOGLOBIN A1C
HEMOGLOBIN A1C: 6.1 % — AB (ref 4.8–5.6)
MEAN PLASMA GLUCOSE: 128 mg/dL

## 2014-12-08 LAB — TSH: TSH: 1.772 u[IU]/mL (ref 0.350–4.500)

## 2014-12-08 MED ORDER — SENNOSIDES-DOCUSATE SODIUM 8.6-50 MG PO TABS
1.0000 | ORAL_TABLET | Freq: Every evening | ORAL | Status: DC | PRN
  Filled 2014-12-08: qty 1

## 2014-12-08 MED ORDER — ENSURE ENLIVE PO LIQD
237.0000 mL | Freq: Two times a day (BID) | ORAL | Status: DC
  Administered 2014-12-08 – 2014-12-09 (×4): 237 mL via ORAL

## 2014-12-08 MED ORDER — INFLUENZA VAC SPLIT QUAD 0.5 ML IM SUSY
0.5000 mL | PREFILLED_SYRINGE | INTRAMUSCULAR | Status: AC
  Administered 2014-12-09: 0.5 mL via INTRAMUSCULAR
  Filled 2014-12-08: qty 0.5

## 2014-12-08 MED ORDER — SIMVASTATIN 40 MG PO TABS
40.0000 mg | ORAL_TABLET | Freq: Every day | ORAL | Status: DC
  Administered 2014-12-08 – 2014-12-09 (×2): 40 mg via ORAL
  Filled 2014-12-08 (×2): qty 1

## 2014-12-08 MED ORDER — PERFLUTREN LIPID MICROSPHERE
1.0000 mL | INTRAVENOUS | Status: AC | PRN
  Administered 2014-12-08: 2 mL via INTRAVENOUS
  Filled 2014-12-08: qty 10

## 2014-12-08 MED ORDER — STROKE: EARLY STAGES OF RECOVERY BOOK
Freq: Once | Status: AC
  Administered 2014-12-08: 06:00:00
  Filled 2014-12-08: qty 1

## 2014-12-08 MED ORDER — HEPARIN SODIUM (PORCINE) 5000 UNIT/ML IJ SOLN
5000.0000 [IU] | Freq: Three times a day (TID) | INTRAMUSCULAR | Status: DC
  Administered 2014-12-08 – 2014-12-09 (×5): 5000 [IU] via SUBCUTANEOUS
  Filled 2014-12-08 (×5): qty 1

## 2014-12-08 NOTE — Progress Notes (Signed)
Patient seen and examined. Admitted after midnight secondary to left side weakness and numbness. Onset of symptoms 4 days PTA. 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 Patient denies CP, SOB or any other complaints currently. No dysarthria and able to follow commands. Please refer to H&P written by Dr. Alcario Drought for further info/details on admission.  Plan: -complete stroke work up -started on statins for HLD -continue ASA for secondary prevention -risk factors modifications -neurology on board; will follow rec's -hopefully home in am if work up completed and no further abnormalities or needs appreciated   Barton Dubois [phone]

## 2014-12-08 NOTE — Evaluation (Signed)
Physical Therapy Evaluation Patient Details Name: Sandra [name] MRN: [id number] DOB: Sep 08, 1955 Today's Date: 12/08/2014   History of Present Illness  Pt adm with lt arm weakness x 4 days. MRI showed subacute subcentimeter rt CVA.  Clinical Impression  Pt doing well with mobility and no further PT needed.        Follow Up Recommendations No PT follow up    Equipment Recommendations  None recommended by PT    Recommendations for Other Services       Precautions / Restrictions Precautions Precautions: None      Mobility  Bed Mobility Overal bed mobility: Independent                Transfers Overall transfer level: Independent                  Ambulation/Gait Ambulation/Gait assistance: Modified independent (Device/Increase time) Ambulation Distance (Feet): 400 Feet Assistive device: None Gait Pattern/deviations: WFL(Within Functional Limits)     General Gait Details: 1 initial episode of unsteadiness but pt able to self correct and no more episodes. Pt able to turn 360 degrees and pick up object off floor without difficulty.  Stairs Stairs: Yes Stairs assistance: Supervision Stair Management: One rail Right;Alternating pattern;Forwards Number of Stairs: 12    Wheelchair Mobility    Modified Rankin (Stroke Patients Only) Modified Rankin (Stroke Patients Only) Pre-Morbid Rankin Score: No symptoms Modified Rankin: No significant disability     Balance Overall balance assessment: Modified Independent                                           Pertinent Vitals/Pain Pain Assessment: No/denies pain    Home Living Family/patient expects to be discharged to:: Private residence Living Arrangements: Children Available Help at Discharge: Family                  Prior Function Level of Independence: Independent               Hand Dominance        Extremity/Trunk Assessment   Upper Extremity Assessment:  Defer to OT evaluation           Lower Extremity Assessment: Overall WFL for tasks assessed         Communication   Communication: Prefers language other than [place];Interpreter utilized  Cognition Arousal/Alertness: Awake/alert Behavior During Therapy: WFL for tasks assessed/performed Overall Cognitive Status: Within Functional Limits for tasks assessed                      General Comments      Exercises        Assessment/Plan    PT Assessment Patent does not need any further PT services  PT Diagnosis Difficulty walking   PT Problem List    PT Treatment Interventions     PT Goals (Current goals can be found in the Care Plan section) Acute Rehab PT Goals PT Goal Formulation: All assessment and education complete, DC therapy    Frequency     Barriers to discharge        Co-evaluation               End of Session   Activity Tolerance: Patient tolerated treatment well Patient left: in bed;with call bell/phone within reach;with family/visitor present Nurse Communication: Mobility status  Time: [id number] PT Time Calculation (min) (ACUTE ONLY): 9 min   Charges:   PT Evaluation $Initial PT Evaluation Tier I: 1 Procedure     PT G Codes:        Sandra [name] December 29, 2014, 3:35 PM [facility] PT [phone]

## 2014-12-08 NOTE — Progress Notes (Signed)
Echocardiogram 2D Echocardiogram has been performed.  Joelene Millin 12/08/2014, 11:44 AM

## 2014-12-08 NOTE — Progress Notes (Signed)
STROKE TEAM PROGRESS NOTE   HISTORY Sandra [name] is an 59 y.o. female, right handed, with a past medical history significant for HTN, transfer to [facility] for further evaluation of left sided weakness, dizziness, unsteadiness x 4 days. Patient initially presented to [facility] whre she had a CT brain that sowed no acute abnormality and was sent to [facility] for neuro evaluation. She is accompanied by a family member that is at the bedside and helps with translation. Patient said that she developed gradual onset of left leg-arm-face weakness 4 days ago, as well as dizziness, imbalance, and transient slurred speech. She stated that she had similar symptoms 2 months ago that resolved 24 hours later. Complains of mild HA but denies double vision, difficulty swallowing, language or vision impairment. No neck pain, fever, or palpitations. His last known well is uncertain. Patient was not administered TPA secondary to late presentation. She was admitted for further evaluation and treatment.   SUBJECTIVE (INTERVAL HISTORY) Her sister/friend is at the bedside. Patient speaks limited English and sister translates Overall she feels her condition is stable.    OBJECTIVE Temp:  [97.8 F (36.6 C)-99.5 F (37.5 C)] 97.8 F (36.6 C) (12/02 0800) Pulse Rate:  [57-77] 57 (12/02 0800) Cardiac Rhythm:  [-] Normal sinus rhythm (12/02 0706) Resp:  [16-26] 18 (12/02 0800) BP: (116-174)/(73-116) 143/88 mmHg (12/02 0800) SpO2:  [98 %-100 %] 98 % (12/02 0800) Weight:  [55.339 kg (122 lb)-56 kg (123 lb 7.3 oz)] 56 kg (123 lb 7.3 oz) (12/02 0210)  CBC:  Recent Labs Lab 12/07/14 1455  WBC 7.7  HGB 13.2  HCT 40.0  MCV 87.3  PLT 123456    Basic Metabolic Panel:  Recent Labs Lab 12/07/14 1455  NA 134*  K 4.4  CL 102  CO2 26  GLUCOSE 126*  BUN 10  CREATININE 0.79  CALCIUM 9.4    Lipid Panel:    Component Value Date/Time   CHOL 231* 12/08/2014 0016   TRIG 128 12/08/2014 0016   HDL 35* 12/08/2014 0016   CHOLHDL  6.6 12/08/2014 0016   VLDL 26 12/08/2014 0016   LDLCALC 170* 12/08/2014 0016   HgbA1c:  Lab Results  Component Value Date   HGBA1C 6.1* 12/07/2014   Urine Drug Screen: No results found for: LABOPIA, COCAINSCRNUR, LABBENZ, AMPHETMU, THCU, LABBARB    IMAGING  Dg Chest 2 View 12/07/2014   No active cardiopulmonary disease.   Ct Head Wo Contrast 12/07/2014   No acute intracranial pathology.   MRI HEAD 12/07/2014  Acute subcentimeter infarct RIGHT posterior limb of the internal capsule (lenticulostriate territory, typically arising from M1). Mild pontine white matter changes compatible with chronic small vessel ischemic disease. Empty sella.   MRA HEAD 12/07/2014  Moderate stenosis RIGHT M1 segment. No acute large vessel occlusion.    PHYSICAL EXAM Pleasant middle aged african lady not in distress. . Afebrile. Head is nontraumatic. Neck is supple without bruit.    Cardiac exam no murmur or gallop. Lungs are clear to auscultation. Distal pulses are well felt. Neurological Exam :  Awake alert oriented x 3 normal speech and language. Extraocular movements are full range without nystagmus. Fundi were not visualized. Vision acuity seems adequate. left lower face asymmetry. Tongue midline. No drift. Mild diminished fine finger movements on left. Orbits right over left upper extremity. Mild left grip weak. Mild left hip flexor and ankle dorsiflexor weakness. Normal sensation . Normal coordination. Gait not tested ASSESSMENT/PLAN [name] is a 59 y.o. female from [place]  with no documented PMH presenting with left sided weakness. She did not receive IV t-PA due to delay in arrival.   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 No large vessel occlusion  Carotid  Doppler  pending   2D Echo  pending   LDL 170  HgbA1c 6.1  Heparin 5000 units sq tid for VTE prophylaxis  Diet Heart Room service appropriate?: Yes; Fluid consistency:: Thin  No antithrombotic prior to admission, now on aspirin 81 mg daily  Patient counseled to be compliant with her antithrombotic medications  Ongoing aggressive stroke risk factor management  Therapy recommendations:  pending   Disposition:  pending   Hypertension  Slightly elevated  Permissive hypertension (OK if < 220/120) but gradually normalize in 5-7 days  Hyperlipidemia  Home meds:  No statin  LDL 170, goal < 70  Now on Zocor 40  Continue statin at discharge  Other Active Problems  Dry eyes, recommend drops  Hospital day # 0  Radene Journey [facility] for Pager information 12/08/2014 3:49 PM I I have personally examined this patient, reviewed notes, independently viewed imaging studies, participated in medical decision making and plan of care. I have made any additions or clarifications directly to the above note. Agree with note above. She presented with left-sided weakness for 4 days due to right internal capsule infarct etiology likely small vessel disease. She remains at risk for recurrent stroke, TIA, neurological worsening and needs ongoing stroke evaluation and aggressive risk factor control. I had a long discussion with the patient at the bedside. Interpretations by her sister as patient speaks limited [place] and answered questions.  Antony Contras, MD Medical Director [facility] Pager: [phone] 12/08/2014 5:11 PM    To contact Stroke Continuity provider, please refer to [email or web address] After hours, contact General Neurology

## 2014-12-08 NOTE — Care Management Note (Signed)
Case Management Note  Patient Details  Name: Sandra [name] MRN: [id number] Date of Birth: 29-Aug-1955  Subjective/Objective:     Date: Spoke with patient at the bedside along with daughter, Comer Locket [id number] who speaks English.  Introduced self as Tourist information centre manager and explained role in discharge planning and how to be reached.  Verified patient lives in town, alone with daughter.  Expressed potential need for no other DME.  Verified patient anticipates to go home with family, at time of discharge and will have full-time  supervision by family at this time to best of their knowledge. Daughter denied needing help with their medication.  Patient is driven by daughter to MD appointments.  Verified patient has PCP Maurice Small- will be new patient at this office. Await pt eval.  Plan: CM will continue to follow for discharge planning and [facility] resources.                Action/Plan:   Expected Discharge Date:  12/10/14               Expected Discharge Plan:  Home/Self Care  In-House Referral:     Discharge planning Services  CM Consult  Post Acute Care Choice:    Choice offered to:     DME Arranged:    DME Agency:     HH Arranged:    HH Agency:     Status of Service:  Completed, signed off  Medicare Important Message Given:    Date Medicare IM Given:    Medicare IM give by:    Date Additional Medicare IM Given:    Additional Medicare Important Message give by:     If discussed at [place] of Stay Meetings, dates discussed:    Additional Comments:  Zenon Mayo, RN 12/08/2014, 2:48 PM

## 2014-12-08 NOTE — Progress Notes (Signed)
Nutrition Brief Note  Patient identified on the Malnutrition Screening Tool (MST) Report  Wt Readings from Last 15 Encounters:  12/08/14 123 lb 7.3 oz (56 kg)   Sandra [name] is a 59 y.o. female with no PMH, presents to ED with L sided weakness. Symptoms have been persistent since onset some 4 days ago (out of TPA window). No speech changes. No headache, no history of prior symptoms like this.  Pt admitted with CVA. Per MD notes, pt to undergo full stroke work-up.   Attempted to examine pt x 3, however, pt was either undergoing EEG or in with providers at time of visit.   Pt consumed 80% of her breakfast this morning.  Body mass index is 22.57 kg/(m^2). Patient meets criteria for normal weight range based on current BMI.   Current diet order is Heart Healthy, patient is consuming approximately 80% of meals at this time. Labs and medications reviewed.   No nutrition interventions warranted at this time. If nutrition issues arise, please consult RD.   Marchelle Rinella A. Jimmye Norman, RD, LDN, CDE Pager: [phone] After hours Pager: [phone]

## 2014-12-08 NOTE — H&P (Signed)
[facility] History and Physical  Sandra [name] [id number] DOB: 02-27-1955 DOA: 12/07/2014  Referring physician: EDP PCP: No PCP Per Patient   Chief Complaint: Weakness   HPI: Sandra [name] is a 59 y.o. female with no PMH, presents to ED with L sided weakness.  Symptoms have been persistent since onset some 4 days ago (out of TPA window).  No speech changes.  No headache, no history of prior symptoms like this.  MRI confirms small acute ischemic stroke.  Review of Systems: Systems reviewed.  As above, otherwise negative  History reviewed. No pertinent past medical history. History reviewed. No pertinent past surgical history. Social History:  reports that she has never smoked. She does not have any smokeless tobacco history on file. She reports that she does not drink alcohol or use illicit drugs.  No Known Allergies  No family history on file.   Prior to Admission medications   Medication Sig Start Date End Date Taking? Authorizing Provider  Multiple Vitamins-Calcium (ONE-A-DAY WOMENS PO) Take 1 tablet by mouth daily.   Yes Historical Provider, MD   Physical Exam: Filed Vitals:   12/07/14 2200 12/07/14 2245  BP:    Pulse: 72 59  Temp:    Resp: 21 18    BP 163/75 mmHg  Pulse 59  Temp(Src) 99.3 F (37.4 C) (Oral)  Resp 18  Ht 5\' 2"  (1.575 m)  Wt 55.339 kg (122 lb)  BMI 22.31 kg/m2  SpO2 100%  General Appearance:    Alert, oriented, no distress, appears stated age  Head:    Normocephalic, atraumatic  Eyes:    PERRL, EOMI, sclera non-icteric        Nose:   Nares without drainage or epistaxis. Mucosa, turbinates normal  Throat:   Moist mucous membranes. Oropharynx without erythema or exudate.  Neck:   Supple. No carotid bruits.  No thyromegaly.  No lymphadenopathy.   Back:     No CVA tenderness, no spinal tenderness  Lungs:     Clear to auscultation bilaterally, without wheezes, rhonchi or rales  Chest wall:    No tenderness to palpitation  Heart:     Regular rate and rhythm without murmurs, gallops, rubs  Abdomen:     Soft, non-tender, nondistended, normal bowel sounds, no organomegaly  Genitalia:    deferred  Rectal:    deferred  Extremities:   No clubbing, cyanosis or edema.  Pulses:   2+ and symmetric all extremities  Skin:   Skin color, texture, turgor normal, no rashes or lesions  Lymph nodes:   Cervical, supraclavicular, and axillary nodes normal  Neurologic:   Distal LUE and LLE weakness compared to R.    Labs on Admission:  Basic Metabolic Panel:  Recent Labs Lab 12/07/14 1455  NA 134*  K 4.4  CL 102  CO2 26  GLUCOSE 126*  BUN 10  CREATININE 0.79  CALCIUM 9.4   Liver Function Tests:  Recent Labs Lab 12/07/14 1455  AST 25  ALT 17  ALKPHOS 64  BILITOT 0.7  PROT 9.3*  ALBUMIN 4.2   No results for input(s): LIPASE, AMYLASE in the last 168 hours. No results for input(s): AMMONIA in the last 168 hours. CBC:  Recent Labs Lab 12/07/14 1455  WBC 7.7  HGB 13.2  HCT 40.0  MCV 87.3  PLT 201   Cardiac Enzymes:  Recent Labs Lab 12/07/14 1455  TROPONINI <0.03    BNP (last 3 results) No results for input(s): PROBNP in the last 8760 hours.  CBG:  Recent Labs Lab 12/07/14 1457  GLUCAP 108*    Radiological Exams on Admission: Dg Chest 2 View  12/07/2014  CLINICAL DATA:  Dizziness, weakness left side of the body for 4 days EXAM: CHEST  2 VIEW COMPARISON:  None. FINDINGS: Cardiomediastinal silhouette is unremarkable. No acute infiltrate or pleural effusion. No pulmonary edema. Bony thorax is unremarkable. IMPRESSION: No active cardiopulmonary disease. Electronically Signed   By: Lahoma Crocker M.D.   On: 12/07/2014 16:12   Ct Head Wo Contrast  12/07/2014  CLINICAL DATA:  Dizziness, weakness, left side. EXAM: CT HEAD WITHOUT CONTRAST TECHNIQUE: Contiguous axial images were obtained from the base of the skull through the vertex without intravenous contrast. COMPARISON:  None. FINDINGS: There is no evidence of  mass effect, midline shift, or extra-axial fluid collections. There is no evidence of a space-occupying lesion or intracranial hemorrhage. There is no evidence of a cortical-based area of acute infarction. There is generalized cerebral atrophy. There is periventricular white matter low attenuation likely secondary to microangiopathy. The ventricles and sulci are appropriate for the patient's age. The basal cisterns are patent. Visualized portions of the orbits are unremarkable. The visualized portions of the paranasal sinuses and mastoid air cells are unremarkable. The osseous structures are unremarkable. IMPRESSION: No acute intracranial pathology. Electronically Signed   By: Kathreen Devoid   On: 12/07/2014 16:14   [name] Head Wo Contrast  12/07/2014  CLINICAL DATA:  Four-day history of gradual onset LEFT extremity and facial weakness, dizziness, imbalance and transient slurred speech. Similar symptoms 2 months ago which resolved. EXAM: MRI HEAD WITHOUT AND WITH CONTRAST MRA HEAD WITHOUT CONTRAST TECHNIQUE: Multiplanar, multiecho pulse sequences of the brain and surrounding structures were obtained without and with intravenous contrast. Angiographic images of the head were obtained using MRA technique without contrast. CONTRAST:  57mL MULTIHANCE GADOBENATE DIMEGLUMINE 529 MG/ML IV SOLN COMPARISON:  CT head December 07, 2014 at 1556 hours FINDINGS: MRI HEAD FINDINGS 9 mm ovoid focus of reduced diffusion posterior limb of RIGHT internal capsule, corresponding low ADC values. Faint associated FLAIR T2 hyperintense signal, no associated enhancement. The ventricles and sulci are normal for patient's age, cavum septum pellucidum et vergae, normal variant. No abnormal parenchymal signal, mass lesions, mass effect. Faint T2 hyperintense signal within the pons. No abnormal parenchymal enhancement. No susceptibility artifact to suggest hemorrhage. No abnormal extra-axial fluid collections. No extra-axial masses nor  leptomeningeal enhancement. Normal major intracranial vascular flow voids seen at the skull base. Ocular globes and orbital contents are unremarkable though not tailored for evaluation. No suspicious calvarial bone marrow signal. Mildly expanded fluid filled sella compatible with empty sella, thin rind of pituitary tissue along the floor. Craniocervical junction maintained. Mild LEFT maxillary sinus mucosal thickening without paranasal sinus air-fluid levels. The mastoid air cells are well aerated. MRA HEAD FINDINGS Moderately motion degraded examination. Anterior circulation: Normal flow related enhancement of the included cervical, petrous, cavernous and supra clinoid internal carotid arteries. Patent anterior communicating artery. Normal flow related enhancement of the anterior and middle cerebral arteries, including more distal segments. Moderate stenosis distal RIGHT M1 segment. No large vessel occlusion, high-grade stenosis, abnormal luminal irregularity, aneurysm. Posterior circulation: LEFT vertebral artery is dominant. Basilar artery is patent, with normal flow related enhancement of the main branch vessels. Fetal origin bilateral posterior cerebral arteries. LEFT posterior communicating artery origin infundibulum. Normal flow related enhancement of the posterior cerebral arteries. No large vessel occlusion, high-grade stenosis, abnormal luminal irregularity, aneurysm. IMPRESSION: MRI HEAD: Acute subcentimeter infarct  RIGHT posterior limb of the internal capsule (lenticulostriate territory, typically arising from M1). Mild pontine white matter changes compatible with chronic small vessel ischemic disease. Empty sella. MRA HEAD: Moderate stenosis RIGHT M1 segment. No acute large vessel occlusion. Complete circle of Willis. Electronically Signed   By: Elon Alas M.D.   On: 12/07/2014 21:05   Mr Jeri Cos [id number] Contrast  12/07/2014  CLINICAL DATA:  Four-day history of gradual onset LEFT extremity and  facial weakness, dizziness, imbalance and transient slurred speech. Similar symptoms 2 months ago which resolved. EXAM: MRI HEAD WITHOUT AND WITH CONTRAST MRA HEAD WITHOUT CONTRAST TECHNIQUE: Multiplanar, multiecho pulse sequences of the brain and surrounding structures were obtained without and with intravenous contrast. Angiographic images of the head were obtained using MRA technique without contrast. CONTRAST:  29mL MULTIHANCE GADOBENATE DIMEGLUMINE 529 MG/ML IV SOLN COMPARISON:  CT head December 07, 2014 at 1556 hours FINDINGS: MRI HEAD FINDINGS 9 mm ovoid focus of reduced diffusion posterior limb of RIGHT internal capsule, corresponding low ADC values. Faint associated FLAIR T2 hyperintense signal, no associated enhancement. The ventricles and sulci are normal for patient's age, cavum septum pellucidum et vergae, normal variant. No abnormal parenchymal signal, mass lesions, mass effect. Faint T2 hyperintense signal within the pons. No abnormal parenchymal enhancement. No susceptibility artifact to suggest hemorrhage. No abnormal extra-axial fluid collections. No extra-axial masses nor leptomeningeal enhancement. Normal major intracranial vascular flow voids seen at the skull base. Ocular globes and orbital contents are unremarkable though not tailored for evaluation. No suspicious calvarial bone marrow signal. Mildly expanded fluid filled sella compatible with empty sella, thin rind of pituitary tissue along the floor. Craniocervical junction maintained. Mild LEFT maxillary sinus mucosal thickening without paranasal sinus air-fluid levels. The mastoid air cells are well aerated. MRA HEAD FINDINGS Moderately motion degraded examination. Anterior circulation: Normal flow related enhancement of the included cervical, petrous, cavernous and supra clinoid internal carotid arteries. Patent anterior communicating artery. Normal flow related enhancement of the anterior and middle cerebral arteries, including more  distal segments. Moderate stenosis distal RIGHT M1 segment. No large vessel occlusion, high-grade stenosis, abnormal luminal irregularity, aneurysm. Posterior circulation: LEFT vertebral artery is dominant. Basilar artery is patent, with normal flow related enhancement of the main branch vessels. Fetal origin bilateral posterior cerebral arteries. LEFT posterior communicating artery origin infundibulum. Normal flow related enhancement of the posterior cerebral arteries. No large vessel occlusion, high-grade stenosis, abnormal luminal irregularity, aneurysm. IMPRESSION: MRI HEAD: Acute subcentimeter infarct RIGHT posterior limb of the internal capsule (lenticulostriate territory, typically arising from M1). Mild pontine white matter changes compatible with chronic small vessel ischemic disease. Empty sella. MRA HEAD: Moderate stenosis RIGHT M1 segment. No acute large vessel occlusion. Complete circle of Willis. Electronically Signed   By: Elon Alas M.D.   On: 12/07/2014 21:05    EKG: Independently reviewed.  Assessment/Plan Active Problems:   Stroke ([place])   1. Stroke -  1. Stroke pathway 2. ASA 81 daily 3. PT/OT/SLP    Code Status: Full  Family Communication: No family in room Disposition Plan: Admit to inpatient   Time spent: 50 min  Michall Noffke M. [facility] Pager [phone]  If 7AM-7PM, please contact the day team taking care of the patient [email or web address] Password [email or web address] 12/08/2014, 12:55 AM

## 2014-12-09 ENCOUNTER — Inpatient Hospital Stay (HOSPITAL_COMMUNITY): Payer: 59

## 2014-12-09 DIAGNOSIS — I63311 Cerebral infarction due to thrombosis of right middle cerebral artery: Secondary | ICD-10-CM

## 2014-12-09 DIAGNOSIS — I639 Cerebral infarction, unspecified: Principal | ICD-10-CM

## 2014-12-09 LAB — CBC
HCT: 41.8 % (ref 36.0–46.0)
Hemoglobin: 13.7 g/dL (ref 12.0–15.0)
MCH: 28.8 pg (ref 26.0–34.0)
MCHC: 32.8 g/dL (ref 30.0–36.0)
MCV: 88 fL (ref 78.0–100.0)
Platelets: 217 K/uL (ref 150–400)
RBC: 4.75 MIL/uL (ref 3.87–5.11)
RDW: 13.1 % (ref 11.5–15.5)
WBC: 7.8 K/uL (ref 4.0–10.5)

## 2014-12-09 LAB — BASIC METABOLIC PANEL
ANION GAP: 8 (ref 5–15)
BUN: 16 mg/dL (ref 6–20)
CALCIUM: 9.5 mg/dL (ref 8.9–10.3)
CO2: 23 mmol/L (ref 22–32)
Chloride: 101 mmol/L (ref 101–111)
Creatinine, Ser: 0.75 mg/dL (ref 0.44–1.00)
Glucose, Bld: 104 mg/dL — ABNORMAL HIGH (ref 65–99)
POTASSIUM: 4.4 mmol/L (ref 3.5–5.1)
SODIUM: 132 mmol/L — AB (ref 135–145)

## 2014-12-09 MED ORDER — SENNOSIDES-DOCUSATE SODIUM 8.6-50 MG PO TABS: 1.0000 | ORAL_TABLET | Freq: Every evening | ORAL | Status: DC | PRN

## 2014-12-09 MED ORDER — SIMVASTATIN 40 MG PO TABS: 40.0000 mg | ORAL_TABLET | Freq: Every day | ORAL | Status: DC

## 2014-12-09 MED ORDER — ASPIRIN 81 MG PO TBEC: 81.0000 mg | DELAYED_RELEASE_TABLET | Freq: Every day | ORAL | Status: DC

## 2014-12-09 MED ORDER — ENSURE ENLIVE PO LIQD: 237.0000 mL | Freq: Two times a day (BID) | ORAL | Status: DC

## 2014-12-09 NOTE — Discharge Summary (Signed)
Physician Discharge Summary  Seini Lannom MRN: [id number] DOB/AGE: [birth year] 59 y.o.  PCP: No PCP Per Patient   Admit date: 12/07/2014 Discharge date: 12/09/2014  Discharge Diagnoses:     Active Problems:   Stroke ([place])   Left-sided weakness   HLD (hyperlipidemia)   Benign essential HTN   Cerebrovascular accident (CVA) due to occlusion of cerebral artery ([place])    Follow-up recommendations Follow-up with PCP in 3-5 days , including all  additional recommended appointments as below Follow-up CBC, CMP in 3-5 days Follow-up with  Antony Contras, MD in 2 months     Medication List    TAKE these medications        aspirin 81 MG EC tablet  Take 1 tablet (81 mg total) by mouth daily.     feeding supplement (ENSURE ENLIVE) Liqd  Take 237 mLs by mouth 2 (two) times daily between meals.     ONE-A-DAY WOMENS PO  Take 1 tablet by mouth daily.     senna-docusate 8.6-50 MG tablet  Commonly known as:  Senokot-S  Take 1 tablet by mouth at bedtime as needed for mild constipation or moderate constipation.     simvastatin 40 MG tablet  Commonly known as:  ZOCOR  Take 1 tablet (40 mg total) by mouth daily at 6 PM.         Discharge Condition: Stable   Discharge Instructions     No Known Allergies    Disposition: Final discharge disposition not confirmed   Consults:  Neurology     Significant Diagnostic Studies:  Dg Chest 2 View  12/07/2014  CLINICAL DATA:  Dizziness, weakness left side of the body for 4 days EXAM: CHEST  2 VIEW COMPARISON:  None. FINDINGS: Cardiomediastinal silhouette is unremarkable. No acute infiltrate or pleural effusion. No pulmonary edema. Bony thorax is unremarkable. IMPRESSION: No active cardiopulmonary disease. Electronically Signed   By: Lahoma Crocker M.D.   On: 12/07/2014 16:12   Ct Head Wo Contrast  12/07/2014  CLINICAL DATA:  Dizziness, weakness, left side. EXAM: CT HEAD WITHOUT CONTRAST TECHNIQUE: Contiguous axial images were  obtained from the base of the skull through the vertex without intravenous contrast. COMPARISON:  None. FINDINGS: There is no evidence of mass effect, midline shift, or extra-axial fluid collections. There is no evidence of a space-occupying lesion or intracranial hemorrhage. There is no evidence of a cortical-based area of acute infarction. There is generalized cerebral atrophy. There is periventricular white matter low attenuation likely secondary to microangiopathy. The ventricles and sulci are appropriate for the patient's age. The basal cisterns are patent. Visualized portions of the orbits are unremarkable. The visualized portions of the paranasal sinuses and mastoid air cells are unremarkable. The osseous structures are unremarkable. IMPRESSION: No acute intracranial pathology. Electronically Signed   By: Kathreen Devoid   On: 12/07/2014 16:14   [name] Head Wo Contrast  12/07/2014  CLINICAL DATA:  Four-day history of gradual onset LEFT extremity and facial weakness, dizziness, imbalance and transient slurred speech. Similar symptoms 2 months ago which resolved. EXAM: MRI HEAD WITHOUT AND WITH CONTRAST MRA HEAD WITHOUT CONTRAST TECHNIQUE: Multiplanar, multiecho pulse sequences of the brain and surrounding structures were obtained without and with intravenous contrast. Angiographic images of the head were obtained using MRA technique without contrast. CONTRAST:  52mL MULTIHANCE GADOBENATE DIMEGLUMINE 529 MG/ML IV SOLN COMPARISON:  CT head December 07, 2014 at 1556 hours FINDINGS: MRI HEAD FINDINGS 9 mm ovoid focus of reduced diffusion posterior limb of RIGHT internal  capsule, corresponding low ADC values. Faint associated FLAIR T2 hyperintense signal, no associated enhancement. The ventricles and sulci are normal for patient's age, cavum septum pellucidum et vergae, normal variant. No abnormal parenchymal signal, mass lesions, mass effect. Faint T2 hyperintense signal within the pons. No abnormal parenchymal  enhancement. No susceptibility artifact to suggest hemorrhage. No abnormal extra-axial fluid collections. No extra-axial masses nor leptomeningeal enhancement. Normal major intracranial vascular flow voids seen at the skull base. Ocular globes and orbital contents are unremarkable though not tailored for evaluation. No suspicious calvarial bone marrow signal. Mildly expanded fluid filled sella compatible with empty sella, thin rind of pituitary tissue along the floor. Craniocervical junction maintained. Mild LEFT maxillary sinus mucosal thickening without paranasal sinus air-fluid levels. The mastoid air cells are well aerated. MRA HEAD FINDINGS Moderately motion degraded examination. Anterior circulation: Normal flow related enhancement of the included cervical, petrous, cavernous and supra clinoid internal carotid arteries. Patent anterior communicating artery. Normal flow related enhancement of the anterior and middle cerebral arteries, including more distal segments. Moderate stenosis distal RIGHT M1 segment. No large vessel occlusion, high-grade stenosis, abnormal luminal irregularity, aneurysm. Posterior circulation: LEFT vertebral artery is dominant. Basilar artery is patent, with normal flow related enhancement of the main branch vessels. Fetal origin bilateral posterior cerebral arteries. LEFT posterior communicating artery origin infundibulum. Normal flow related enhancement of the posterior cerebral arteries. No large vessel occlusion, high-grade stenosis, abnormal luminal irregularity, aneurysm. IMPRESSION: MRI HEAD: Acute subcentimeter infarct RIGHT posterior limb of the internal capsule (lenticulostriate territory, typically arising from M1). Mild pontine white matter changes compatible with chronic small vessel ischemic disease. Empty sella. MRA HEAD: Moderate stenosis RIGHT M1 segment. No acute large vessel occlusion. Complete circle of Willis. Electronically Signed   By: Elon Alas M.D.   On:  12/07/2014 21:05   Mr Jeri Cos MW Contrast  12/07/2014  CLINICAL DATA:  Four-day history of gradual onset LEFT extremity and facial weakness, dizziness, imbalance and transient slurred speech. Similar symptoms 2 months ago which resolved. EXAM: MRI HEAD WITHOUT AND WITH CONTRAST MRA HEAD WITHOUT CONTRAST TECHNIQUE: Multiplanar, multiecho pulse sequences of the brain and surrounding structures were obtained without and with intravenous contrast. Angiographic images of the head were obtained using MRA technique without contrast. CONTRAST:  20m MULTIHANCE GADOBENATE DIMEGLUMINE 529 MG/ML IV SOLN COMPARISON:  CT head December 07, 2014 at 1556 hours FINDINGS: MRI HEAD FINDINGS 9 mm ovoid focus of reduced diffusion posterior limb of RIGHT internal capsule, corresponding low ADC values. Faint associated FLAIR T2 hyperintense signal, no associated enhancement. The ventricles and sulci are normal for patient's age, cavum septum pellucidum et vergae, normal variant. No abnormal parenchymal signal, mass lesions, mass effect. Faint T2 hyperintense signal within the pons. No abnormal parenchymal enhancement. No susceptibility artifact to suggest hemorrhage. No abnormal extra-axial fluid collections. No extra-axial masses nor leptomeningeal enhancement. Normal major intracranial vascular flow voids seen at the skull base. Ocular globes and orbital contents are unremarkable though not tailored for evaluation. No suspicious calvarial bone marrow signal. Mildly expanded fluid filled sella compatible with empty sella, thin rind of pituitary tissue along the floor. Craniocervical junction maintained. Mild LEFT maxillary sinus mucosal thickening without paranasal sinus air-fluid levels. The mastoid air cells are well aerated. MRA HEAD FINDINGS Moderately motion degraded examination. Anterior circulation: Normal flow related enhancement of the included cervical, petrous, cavernous and supra clinoid internal carotid arteries. Patent  anterior communicating artery. Normal flow related enhancement of the anterior and middle cerebral arteries, including more distal  segments. Moderate stenosis distal RIGHT M1 segment. No large vessel occlusion, high-grade stenosis, abnormal luminal irregularity, aneurysm. Posterior circulation: LEFT vertebral artery is dominant. Basilar artery is patent, with normal flow related enhancement of the main branch vessels. Fetal origin bilateral posterior cerebral arteries. LEFT posterior communicating artery origin infundibulum. Normal flow related enhancement of the posterior cerebral arteries. No large vessel occlusion, high-grade stenosis, abnormal luminal irregularity, aneurysm. IMPRESSION: MRI HEAD: Acute subcentimeter infarct RIGHT posterior limb of the internal capsule (lenticulostriate territory, typically arising from M1). Mild pontine white matter changes compatible with chronic small vessel ischemic disease. Empty sella. MRA HEAD: Moderate stenosis RIGHT M1 segment. No acute large vessel occlusion. Complete circle of Willis. Electronically Signed   By: Elon Alas M.D.   On: 12/07/2014 21:05        Filed Weights   12/07/14 1434 12/07/14 1841 12/08/14 0210  Weight: 55.339 kg (122 lb) 55.339 kg (122 lb) 56 kg (123 lb 7.3 oz)     Microbiology: No results found for this or any previous visit (from the past 240 hour(s)).     Blood Culture No results found for: SDES, [place], CULT, REPTSTATUS    Labs: Results for orders placed or performed during the hospital encounter of 12/07/14 (from the past 48 hour(s))  CBC     Status: None   Collection Time: 12/07/14  2:55 PM  Result Value Ref Range   WBC 7.7 4.0 - 10.5 K/uL   RBC 4.58 3.87 - 5.11 MIL/uL   Hemoglobin 13.2 12.0 - 15.0 g/dL   HCT 40.0 36.0 - 46.0 %   MCV 87.3 78.0 - 100.0 fL   MCH 28.8 26.0 - 34.0 pg   MCHC 33.0 30.0 - 36.0 g/dL   RDW 12.9 11.5 - 15.5 %   Platelets 201 150 - 400 K/uL  Comprehensive metabolic panel      Status: Abnormal   Collection Time: 12/07/14  2:55 PM  Result Value Ref Range   Sodium 134 (L) 135 - 145 mmol/L   Potassium 4.4 3.5 - 5.1 mmol/L   Chloride 102 101 - 111 mmol/L   CO2 26 22 - 32 mmol/L   Glucose, Bld 126 (H) 65 - 99 mg/dL   BUN 10 6 - 20 mg/dL   Creatinine, Ser 0.79 0.44 - 1.00 mg/dL   Calcium 9.4 8.9 - 10.3 mg/dL   Total Protein 9.3 (H) 6.5 - 8.1 g/dL   Albumin 4.2 3.5 - 5.0 g/dL   AST 25 15 - 41 U/L   ALT 17 14 - 54 U/L   Alkaline Phosphatase 64 38 - 126 U/L   Total Bilirubin 0.7 0.3 - 1.2 mg/dL   GFR calc non Af Amer >60 >60 mL/min   GFR calc Af Amer >60 >60 mL/min    Comment: (NOTE) The eGFR has been calculated using the CKD EPI equation. This calculation has not been validated in all clinical situations. eGFR's persistently <60 mL/min signify possible Chronic Kidney Disease.    Anion gap 6 5 - 15  Troponin I     Status: None   Collection Time: 12/07/14  2:55 PM  Result Value Ref Range   Troponin I <0.03 <0.031 ng/mL    Comment:        NO INDICATION OF MYOCARDIAL INJURY.   POC CBG, ED     Status: Abnormal   Collection Time: 12/07/14  2:57 PM  Result Value Ref Range   Glucose-Capillary 108 (H) 65 - 99 mg/dL  Urinalysis, Routine w  reflex microscopic (not at [facility])     Status: Abnormal   Collection Time: 12/07/14  4:45 PM  Result Value Ref Range   Color, Urine YELLOW YELLOW   APPearance CLEAR CLEAR   Specific Gravity, Urine 1.006 1.005 - 1.030   pH 7.5 5.0 - 8.0   Glucose, UA NEGATIVE NEGATIVE mg/dL   Hgb urine dipstick NEGATIVE NEGATIVE   Bilirubin Urine NEGATIVE NEGATIVE   Ketones, ur NEGATIVE NEGATIVE mg/dL   Protein, ur NEGATIVE NEGATIVE mg/dL   Nitrite NEGATIVE NEGATIVE   Leukocytes, UA TRACE (A) NEGATIVE  Urine microscopic-add on     Status: Abnormal   Collection Time: 12/07/14  4:45 PM  Result Value Ref Range   Squamous Epithelial / LPF 0-5 (A) NONE SEEN   WBC, UA 0-5 0 - 5 WBC/hpf   RBC / HPF 0-5 0 - 5 RBC/hpf   Bacteria, UA RARE (A)  NONE SEEN   Urine-Other MUCOUS PRESENT   Hemoglobin A1c     Status: Abnormal   Collection Time: 12/07/14  9:34 PM  Result Value Ref Range   Hgb A1c MFr Bld 6.1 (H) 4.8 - 5.6 %    Comment: (NOTE)         Pre-diabetes: 5.7 - 6.4         Diabetes: >6.4         Glycemic control for adults with diabetes: <7.0    Mean Plasma Glucose 128 mg/dL    Comment: (NOTE) Performed At: [facility] [place], [place] [id number] Lindon Romp MD [id number]   Lipid panel     Status: Abnormal   Collection Time: 12/08/14 12:16 AM  Result Value Ref Range   Cholesterol 231 (H) 0 - 200 mg/dL   Triglycerides 128 <150 mg/dL   HDL 35 (L) >40 mg/dL   Total CHOL/HDL Ratio 6.6 RATIO   VLDL 26 0 - 40 mg/dL   LDL Cholesterol 170 (H) 0 - 99 mg/dL    Comment:        Total Cholesterol/HDL:CHD Risk Coronary Heart Disease Risk Table                     Men   Women  1/2 Average Risk   3.4   3.3  Average Risk       5.0   4.4  2 X Average Risk   9.6   7.1  3 X Average Risk  23.4   11.0        Use the calculated Patient Ratio above and the CHD Risk Table to determine the patient's CHD Risk.        ATP III CLASSIFICATION (LDL):  <100     mg/dL   Optimal  100-129  mg/dL   Near or Above                    Optimal  130-159  mg/dL   Borderline  160-189  mg/dL   High  >190     mg/dL   Very High   Vitamin B12     Status: Abnormal   Collection Time: 12/08/14 11:25 AM  Result Value Ref Range   Vitamin B-12 1282 (H) 180 - 914 pg/mL    Comment: (NOTE) This assay is not validated for testing neonatal or myeloproliferative syndrome specimens for Vitamin B12 levels.   TSH     Status: None   Collection Time: 12/08/14 11:25 AM  Result Value Ref Range  TSH 1.772 0.350 - 4.500 uIU/mL  CBC     Status: None   Collection Time: 12/09/14  5:21 AM  Result Value Ref Range   WBC 7.8 4.0 - 10.5 K/uL   RBC 4.75 3.87 - 5.11 MIL/uL   Hemoglobin 13.7 12.0 - 15.0 g/dL   HCT 41.8 36.0 - 46.0 %    MCV 88.0 78.0 - 100.0 fL   MCH 28.8 26.0 - 34.0 pg   MCHC 32.8 30.0 - 36.0 g/dL   RDW 13.1 11.5 - 15.5 %   Platelets 217 150 - 400 K/uL  Basic metabolic panel     Status: Abnormal   Collection Time: 12/09/14  5:21 AM  Result Value Ref Range   Sodium 132 (L) 135 - 145 mmol/L   Potassium 4.4 3.5 - 5.1 mmol/L   Chloride 101 101 - 111 mmol/L   CO2 23 22 - 32 mmol/L   Glucose, Bld 104 (H) 65 - 99 mg/dL   BUN 16 6 - 20 mg/dL   Creatinine, Ser 0.75 0.44 - 1.00 mg/dL   Calcium 9.5 8.9 - 10.3 mg/dL   GFR calc non Af Amer >60 >60 mL/min   GFR calc Af Amer >60 >60 mL/min    Comment: (NOTE) The eGFR has been calculated using the CKD EPI equation. This calculation has not been validated in all clinical situations. eGFR's persistently <60 mL/min signify possible Chronic Kidney Disease.    Anion gap 8 5 - 15     Lipid Panel     Component Value Date/Time   CHOL 231* 12/08/2014 0016   TRIG 128 12/08/2014 0016   HDL 35* 12/08/2014 0016   CHOLHDL 6.6 12/08/2014 0016   VLDL 26 12/08/2014 0016   LDLCALC 170* 12/08/2014 0016     Lab Results  Component Value Date   HGBA1C 6.1* 12/07/2014     Lab Results  Component Value Date   LDLCALC 170* 12/08/2014   CREATININE 0.75 12/09/2014     HPI :60 y.o. female, right handed, with a past medical history significant for HTN, transfer to [facility] for further evaluation of left sided weakness, dizziness, unsteadiness x 4 days. Patient initially presented to [facility] whre she had a CT brain that sowed no acute abnormality and was sent to [facility] for neuro evaluation. She is accompanied by a family member that is at the bedside and helps with translation. Patient said that she developed gradual onset of left leg-arm-face weakness 4 days ago, as well as dizziness, imbalance, and transient slurred speech. She stated that she had similar symptoms 2 months ago that resolved 24 hours later. Complains of mild HA but denies double vision, difficulty swallowing,  language or vision impairment. No neck pain, fever, or palpitations. His last known well is uncertain. Patient was not administered TPA secondary to late presentation. She was admitted for further evaluation and treatment  HOSPITAL COURSE: *  59 y.o. female from [place] with no documented PMH presenting with left sided weakness. She did not receive IV t-PA due to delay in arrival.   Stroke: Non-dominant right posterior limb internal capsule infarct secondary to small vessel disease source  Resultant Left hemiparesis  MRI Right posterior limb internal capsule infarct, chronic small vessel disease, empty sella  MRA Moderate right M1 stenosis. No large vessel occlusion  Carotid Doppler    2D Echoshows EF of 60-65%, wall motion is normal,  LDL 170  HgbA1c 6.1  diet Heart healthy: Thin  No antithrombotic prior to admission, now on  aspirin 81 mg daily  PT recommends no follow-up, anticipated discharge home today  Hypertension, controlled  Follow up with PCP  Hyperlipidemia  Home meds: No statin  LDL 170, goal < 70, started on  Zocor 40  Continue statin at discharge   Discharge Exam:    Blood pressure 120/77, pulse 57, temperature 98.4 F (36.9 C), temperature source Oral, resp. rate 20, height _0  (1.575 m), weight 56 kg (123 lb 7.3 oz), SpO2 100 %.    General Appearance:   Alert, oriented, no distress, appears stated age  Head:   Normocephalic, atraumatic  Eyes:   PERRL, EOMI, sclera non-icteric      Nose:  Nares without drainage or epistaxis. Mucosa, turbinates normal  Throat:  Moist mucous membranes. Oropharynx without erythema or exudate.  Neck:  Supple. No carotid bruits. No thyromegaly. No lymphadenopathy.   Back:   No CVA tenderness, no spinal tenderness  Lungs:   Clear to auscultation bilaterally, without wheezes, rhonchi or rales  Chest wall:   No tenderness to palpitation  Heart:   Regular rate and rhythm  without murmurs, gallops, rubs  Abdomen:   Soft, non-tender, nondistended, normal bowel sounds, no organomegaly  Genitalia:   deferred  Rectal:   deferred  Extremities:  No clubbing, cyanosis or edema.  Pulses:  2+ and symmetric all extremities  Skin:  Skin color, texture, turgor normal, no rashes or lesions  Lymph nodes:  Cervical, supraclavicular, and axillary nodes normal  Neurologic:  Distal LUE and LLE weakness compared to R.    Labs on Admission:              Follow-up Information    Follow up with Jonathon Bellows, MD On 12/12/2014.   Specialty:  Family Medicine   Why:  9 am for hosptial follow up   Contact information:   [address] [place] [address] [phone]       Signed: Reyne Dumas 12/09/2014, 9:18 AM        Time spent >45 mins

## 2014-12-09 NOTE — Progress Notes (Signed)
SLP Cancellation Note  Patient Details Name: Sandra [name] MRN: [id number] DOB: May 19, 1955   Cancelled treatment:       Reason Eval/Treat Not Completed: Patient at procedure or test/unavailable  ST will continue efforts to evaluate the patient's cognitive/linguistic skills.  Shelly Flatten, MA, [facility] SLP [phone] Sandra [name] 12/09/2014, 12:21 PM

## 2014-12-09 NOTE — Progress Notes (Signed)
[facility] discharged Home with family per MD order.  Discharge instructions reviewed and discussed with the patient's son Mariana Arn, all questions and concerns answered. Copy of instructions, care notes for new diagnosis & medications given to patient, as well as stroke recovery.    Medication List    TAKE these medications        aspirin 81 MG EC tablet  Take 1 tablet (81 mg total) by mouth daily.     feeding supplement (ENSURE ENLIVE) Liqd  Take 237 mLs by mouth 2 (two) times daily between meals.     ONE-A-DAY WOMENS PO  Take 1 tablet by mouth daily.     senna-docusate 8.6-50 MG tablet  Commonly known as:  Senokot-S  Take 1 tablet by mouth at bedtime as needed for mild constipation or moderate constipation.     simvastatin 40 MG tablet  Commonly known as:  ZOCOR  Take 1 tablet (40 mg total) by mouth daily at 6 PM.        Patients skin is clean, dry and intact, no evidence of skin break down. IV site discontinued and catheter remains intact. Site without signs and symptoms of complications. Dressing and pressure applied.  Patient escorted to car by NT in a wheelchair,  no distress noted upon discharge.  Wynetta Emery, Alontae Chaloux C 12/09/2014 7:03 PM

## 2014-12-09 NOTE — Progress Notes (Signed)
STROKE TEAM PROGRESS NOTE   HISTORY Sandra [name] is an 59 y.o. female, right handed, with a past medical history significant for HTN, transfer to [facility] for further evaluation of left sided weakness, dizziness, unsteadiness x 4 days. Patient initially presented to [facility] whre she had a CT brain that sowed no acute abnormality and was sent to [facility] for neuro evaluation. She is accompanied by a family member that is at the bedside and helps with translation. Patient said that she developed gradual onset of left leg-arm-face weakness 4 days ago, as well as dizziness, imbalance, and transient slurred speech. She stated that she had similar symptoms 2 months ago that resolved 24 hours later. Complains of mild HA but denies double vision, difficulty swallowing, language or vision impairment. No neck pain, fever, or palpitations. His last known well is uncertain. Patient was not administered TPA secondary to late presentation. She was admitted for further evaluation and treatment.   SUBJECTIVE (INTERVAL HISTORY) Her sister/friend is at the bedside. Patient speaks limited English and sister translates Overall she feels her condition is stable. Still has mild left facial and left hand dexterity difficulty but much improved.   OBJECTIVE Temp:  [97.9 F (36.6 C)-98.4 F (36.9 C)] 98.4 F (36.9 C) (12/03 0434) Pulse Rate:  [57-80] 57 (12/03 0434) Cardiac Rhythm:  [-] Normal sinus rhythm (12/02 1932) Resp:  [16-20] 20 (12/03 0434) BP: (116-139)/(72-79) 120/77 mmHg (12/03 0434) SpO2:  [98 %-100 %] 100 % (12/03 0434)  CBC:   Recent Labs Lab 12/07/14 1455 12/09/14 0521  WBC 7.7 7.8  HGB 13.2 13.7  HCT 40.0 41.8  MCV 87.3 88.0  PLT 201 [id number]    Basic Metabolic Panel:   Recent Labs Lab 12/07/14 1455 12/09/14 0521  NA 134* 132*  K 4.4 4.4  CL 102 101  CO2 26 23  GLUCOSE 126* 104*  BUN 10 16  CREATININE 0.79 0.75  CALCIUM 9.4 9.5    Lipid Panel:     Component Value Date/Time   CHOL 231*  12/08/2014 0016   TRIG 128 12/08/2014 0016   HDL 35* 12/08/2014 0016   CHOLHDL 6.6 12/08/2014 0016   VLDL 26 12/08/2014 0016   LDLCALC 170* 12/08/2014 0016   HgbA1c:  Lab Results  Component Value Date   HGBA1C 6.1* 12/07/2014   Urine Drug Screen: No results found for: LABOPIA, COCAINSCRNUR, LABBENZ, AMPHETMU, THCU, LABBARB    IMAGING I have personally reviewed the radiological images below and agree with the radiology interpretations.  Dg Chest 2 View 12/07/2014   No active cardiopulmonary disease.   Ct Head Wo Contrast 12/07/2014   No acute intracranial pathology.   MRI HEAD 12/07/2014  Acute subcentimeter infarct RIGHT posterior limb of the internal capsule (lenticulostriate territory, typically arising from M1). Mild pontine white matter changes compatible with chronic small vessel ischemic disease. Empty sella.   MRA HEAD 12/07/2014  Moderate stenosis RIGHT M1 segment. No acute large vessel occlusion.   CUS - Bilateral: 1-39% ICA stenosis. Vertebral artery flow is antegrade.  2D echo - - Left ventricle: The cavity size was normal. Systolic function was normal. The estimated ejection fraction was in the range of 60% to 65%. Wall motion was normal; there were no regional wall motion abnormalities. There was an increased relative contribution of atrial contraction to ventricular filling, which may be due to hypovolemia. - Atrial septum: No defect or patent foramen ovale was identified.   PHYSICAL EXAM Pleasant middle aged african lady not in distress. . Afebrile.  Head is nontraumatic. Neck is supple without bruit.    Cardiac exam no murmur or gallop. Lungs are clear to auscultation. Distal pulses are well felt. Neurological Exam :  Awake alert oriented x 3 normal speech and language. Extraocular movements are full range without nystagmus. Fundi were not visualized. Vision acuity seems adequate. left lower face asymmetry. Tongue midline. No drift. Mild diminished  fine finger movements on left. Orbits right over left upper extremity. Mild left grip weak. Mild left hip flexor and ankle dorsiflexor weakness. Normal sensation . Normal coordination. Gait not tested ASSESSMENT/PLAN [name] is a 59 y.o. female from [place] with no documented [place] presenting with left sided weakness. She did not receive IV t-PA due to delay in arrival.   Stroke:  right PLIC infarct, secondary to small vessel disease source  Resultant left hand dexterity difficulty and left facial mild weakness  MRI  Right PLIC infarct, chronic small vessel disease, empty sella  MRA  Mild right M1 stenosis. No large vessel occlusion  Carotid Doppler  unremarkable   2D Echo  unremarkable   LDL 170  HgbA1c 6.1  Heparin 5000 units sq tid for VTE prophylaxis Diet Heart Room service appropriate?: Yes; Fluid consistency:: Thin  No antithrombotic prior to admission, now on aspirin 81 mg daily. Continue ASA on discharge.  Patient counseled to be compliant with her antithrombotic medications  Ongoing aggressive stroke risk factor management  Therapy recommendations:  pending   Disposition:  pending   Hypertension  Slightly elevated  Permissive hypertension (OK if < 220/120) but gradually normalize in 5-7 days  Hyperlipidemia  Home meds:  No statin  LDL 170, goal < 70  Now on Zocor 40  Continue statin at discharge  Other Active Problems  Dry eyes, recommend drops  Hospital day # 1  Neurology will sign off. Please call with questions. Pt will follow up with Dr. Erlinda Hong at [facility] in about 2 months. Thanks for the consult.  Rosalin Hawking, MD PhD Stroke Neurology 12/10/2014 7:01 AM   To contact Stroke Continuity provider, please refer to [email or web address] After hours, contact General Neurology

## 2014-12-09 NOTE — Progress Notes (Signed)
*  PRELIMINARY RESULTS* Vascular Ultrasound Carotid Duplex (Doppler) has been completed.  Findings suggest 1-39% internal carotid artery stenosis bilaterally. Vertebral arteries are patent with antegrade flow.  12/09/2014 12:29 PM Maudry Mayhew, RVT, RDCS, RDMS

## 2014-12-10 ENCOUNTER — Other Ambulatory Visit: Payer: Self-pay | Admitting: Neurology

## 2014-12-10 DIAGNOSIS — I639 Cerebral infarction, unspecified: Secondary | ICD-10-CM | POA: Insufficient documentation

## 2015-02-08 ENCOUNTER — Encounter: Payer: Self-pay | Admitting: Neurology

## 2015-02-08 ENCOUNTER — Ambulatory Visit (INDEPENDENT_AMBULATORY_CARE_PROVIDER_SITE_OTHER): Payer: BLUE CROSS/BLUE SHIELD | Admitting: Neurology

## 2015-02-08 VITALS — BP 171/89 | HR 71 | Ht 60.0 in | Wt 125.8 lb

## 2015-02-08 DIAGNOSIS — I63039 Cerebral infarction due to thrombosis of unspecified carotid artery: Secondary | ICD-10-CM

## 2015-02-08 DIAGNOSIS — I639 Cerebral infarction, unspecified: Secondary | ICD-10-CM | POA: Insufficient documentation

## 2015-02-08 MED ORDER — LISINOPRIL 5 MG PO TABS: 5.0000 mg | ORAL_TABLET | Freq: Every day | ORAL | Status: DC

## 2015-02-08 NOTE — Progress Notes (Signed)
[facility] [name] [place]. [place] [address] [phone]       OFFICE FOLLOW-UP NOTE  Sandra [name] Date of Birth:  1955-04-02 Medical Record Number:  [id number]   HPI: Sandra [name] is a 60 year old African-American lady [name] who seen for first office visit following [name] admission for stroke in November 2016. She is accompanied by her daughter who translates for her as patient speaks limited [place]. Patient apparently was admitted 2 days after Thanksgiving to [name] with left facial and hand weakness of a few days duration. She did not seek medical help right away. Unfortunately I am unable to locate's patient's medical records in the [name] system which is surprising as patient apparently was admitted there for 3 days. She underwent stroke evaluation including MRI scan echocardiogram and Doppler studies which I do not have the results for. She was started on aspirin for stroke prevention and Lipitor for hyperlipidemia. Patient was apparently taking blood pressure medicines at her home and [name] but currently she is not on antihypertensives. Her blood pressure today is elevated at 170/100. She has opted improvement in her speech, facial and hand weakness. She is able to do all her activities of daily living and is independent. She is tolerating aspirin and Lipitor without any side effects.  ROS:   14 system review of systems is positive for headache, weakness and all other systems negative  PMH:  Past Medical History  Diagnosis Date  . Stroke ([place])   . Hypertension     Social History:  Social History   Social History  . Marital Status: Single    Spouse Name: N/A  . Number of Children: N/A  . Years of Education: N/A   Occupational History  . Not on file.   Social History Main Topics  . Smoking status: Never Smoker   . Smokeless tobacco: Not on file  . Alcohol Use: No  . Drug Use: No  .  Sexual Activity: Not on file   Other Topics Concern  . Not on file   Social History Narrative    Medications:   Current Outpatient Prescriptions on File Prior to Visit  Medication Sig Dispense Refill  . oxyCODONE-acetaminophen (PERCOCET/ROXICET) 5-325 MG per tablet Take 1-2 tablets by mouth every 6 (six) hours as needed for pain. 15 tablet 0   No current facility-administered medications on file prior to visit.    Allergies:  No Known Allergies  Physical Exam General: Frail middle-aged [name] seated, in no evident distress Head: head normocephalic and atraumatic.  Neck: supple with no carotid or supraclavicular bruits Cardiovascular: regular rate and rhythm, no murmurs Musculoskeletal: no deformity Skin:  no rash/petichiae Vascular:  Normal pulses all extremities Filed Vitals:   02/08/15 1619  BP: 171/89  Pulse: 71   Neurologic Exam Mental Status: Awake and fully alert. Oriented to place and time. Recent and remote memory intact. Attention span, concentration and fund of knowledge appropriate. Mood and affect appropriate.  Cranial Nerves: Fundoscopic exam reveals sharp disc margins. Pupils equal, briskly reactive to light. Extraocular movements full without nystagmus. Visual fields full to confrontation. Hearing intact. Minimum left nasolabial fold asymmetry when she smiles. Facial sensation intact. Face, tongue, palate moves normally and symmetrically.  Motor: Normal bulk and tone. Normal strength in all tested extremity muscles. Diminished fine finger movements on the left. Minimum left grip weakness. Orbits right over left approximately. Sensory.: intact to touch ,pinprick .position and vibratory sensation.  Coordination: Rapid alternating movements normal in all extremities. Finger-to-nose and heel-to-shin performed accurately bilaterally. Gait and Station: Arises from chair without difficulty. Stance is normal. Gait demonstrates normal stride length and balance .  Able to heel, toe and tandem walk without difficulty.  Reflexes: 1+ and symmetric. Toes downgoing.   NIHSS  1 Modified Rankin  1   ASSESSMENT: 66 year Sandra [name] lady with right brain subcortical infarct in November 2016. Vascular risk factors of hypertension and hyperlipidemia    PLAN: I had a long d/w patient and daughter about her recent stroke, risk for recurrent stroke/TIAs, personally independently reviewed imaging studies and stroke evaluation results and answered questions.Continue aspirin 81 mg daily  for secondary stroke prevention and maintain strict control of hypertension with blood pressure goal below 130/90, diabetes with hemoglobin A1c goal below 6.5% and lipids with LDL cholesterol goal below 70 mg/dL. I gave her a prescription of lisinopril 5 mg daily and advised to follow-up with her primary care physician for further adjustment of blood pressure medications I also advised the patient to eat a healthy diet with plenty of whole grains, cereals, fruits and vegetables, exercise regularly and maintain ideal body weight Greater than 50% of time during this 25 minute visit was spent on counseling,explanation of diagnosis, planning of further management, discussion with patient and family and coordination of care .Followup in the future with [name] nurse practitioner in 6 months or call earlier if necessary Sandra Contras, MD Note: This document was prepared with digital dictation and possible smart phrase technology. Any transcriptional errors that result from this process are unintentional

## 2015-02-08 NOTE — Patient Instructions (Signed)
I had a long d/w patient and daughter about her recent stroke, risk for recurrent stroke/TIAs, personally independently reviewed imaging studies and stroke evaluation results and answered questions.Continue aspirin 81 mg daily  for secondary stroke prevention and maintain strict control of hypertension with blood pressure goal below 130/90, diabetes with hemoglobin A1c goal below 6.5% and lipids with LDL cholesterol goal below 70 mg/dL. I gave her a prescription of lisinopril 5 mg daily and advised to follow-up with her primary care physician for further adjustment of blood pressure medications I also advised the patient to eat a healthy diet with plenty of whole grains, cereals, fruits and vegetables, exercise regularly and maintain ideal body weight Followup in the future with [facility]ford Raid nurse practitioner in 6 months or call earlier if necessary Stroke Prevention Some medical conditions and behaviors are associated with an increased chance of having a stroke. You may prevent a stroke by making healthy choices and managing medical conditions. HOW CAN I REDUCE MY RISK OF HAVING A STROKE?   Stay physically active. Get at least 30 minutes of activity on most or all days.  Do not smoke. It may also be helpful to avoid exposure to secondhand smoke.  Limit alcohol use. Moderate alcohol use is considered to be:  No more than 2 drinks per day for men.  No more than 1 drink per day for nonpregnant women.  Eat healthy foods. This involves:  Eating 5 or more servings of fruits and vegetables a day.  Making dietary changes that address high blood pressure (hypertension), high cholesterol, diabetes, or obesity.  Manage your cholesterol levels.  Making food choices that are high in fiber and low in saturated fat, trans fat, and cholesterol may control cholesterol levels.  Take any prescribed medicines to control cholesterol as directed by your health care provider.  Manage your  diabetes.  Controlling your carbohydrate and sugar intake is recommended to manage diabetes.  Take any prescribed medicines to control diabetes as directed by your health care provider.  Control your hypertension.  Making food choices that are low in salt (sodium), saturated fat, trans fat, and cholesterol is recommended to manage hypertension.  Ask your health care provider if you need treatment to lower your blood pressure. Take any prescribed medicines to control hypertension as directed by your health care provider.  If you are 35-15 years of age, have your blood pressure checked every 3-5 years. If you are 25 years of age or older, have your blood pressure checked every year.  Maintain a healthy weight.  Reducing calorie intake and making food choices that are low in sodium, saturated fat, trans fat, and cholesterol are recommended to manage weight.  Stop drug abuse.  Avoid taking birth control pills.  Talk to your health care provider about the risks of taking birth control pills if you are over 81 years old, smoke, get migraines, or have ever had a blood clot.  Get evaluated for sleep disorders (sleep apnea).  Talk to your health care provider about getting a sleep evaluation if you snore a lot or have excessive sleepiness.  Take medicines only as directed by your health care provider.  For some people, aspirin or blood thinners (anticoagulants) are helpful in reducing the risk of forming abnormal blood clots that can lead to stroke. If you have the irregular heart rhythm of atrial fibrillation, you should be on a blood thinner unless there is a good reason you cannot take them.  Understand all your medicine instructions.  Make sure that other conditions (such as anemia or atherosclerosis) are addressed. SEEK IMMEDIATE MEDICAL CARE IF:   You have sudden weakness or numbness of the face, arm, or leg, especially on one side of the body.  Your face or eyelid droops to one  side.  You have sudden confusion.  You have trouble speaking (aphasia) or understanding.  You have sudden trouble seeing in one or both eyes.  You have sudden trouble walking.  You have dizziness.  You have a loss of balance or coordination.  You have a sudden, severe headache with no known cause.  You have new chest pain or an irregular heartbeat. Any of these symptoms may represent a serious problem that is an emergency. Do not wait to see if the symptoms will go away. Get medical help at once. Call your local emergency services (911 in [place] Do not drive yourself to the hospital.   This information is not intended to replace advice given to you by your health care provider. Make sure you discuss any questions you have with your health care provider.   Document Released: 01/31/2004 Document Revised: 01/13/2014 Document Reviewed: 06/25/2012 Elsevier Interactive Patient Education [employer].

## 2015-02-09 ENCOUNTER — Encounter: Payer: Self-pay | Admitting: Neurology

## 2015-08-09 ENCOUNTER — Encounter: Payer: Self-pay | Admitting: Nurse Practitioner

## 2015-08-09 ENCOUNTER — Ambulatory Visit (INDEPENDENT_AMBULATORY_CARE_PROVIDER_SITE_OTHER): Payer: BLUE CROSS/BLUE SHIELD | Admitting: Nurse Practitioner

## 2015-08-09 VITALS — BP 180/79 | HR 59 | Ht 60.0 in | Wt 123.2 lb

## 2015-08-09 DIAGNOSIS — I10 Essential (primary) hypertension: Secondary | ICD-10-CM

## 2015-08-09 DIAGNOSIS — I63311 Cerebral infarction due to thrombosis of right middle cerebral artery: Secondary | ICD-10-CM

## 2015-08-09 DIAGNOSIS — E785 Hyperlipidemia, unspecified: Secondary | ICD-10-CM

## 2015-08-09 NOTE — Patient Instructions (Addendum)
Stressed the importance of management of risk factors to prevent further stroke Continue Aspirin for secondary stroke prevention Maintain strict control of hypertension with blood pressure goal below 130/90, today's reading180/75 continue antihypertensive medications take med when you get home Cholesterol with LDL cholesterol less than 70, followed by primary care,  continue Zocor  Exercise by walking, slowly increase , eat healthy diet with whole grains,  fresh fruits and vegetables Stay well hydrated Follow-up in 6 months

## 2015-08-09 NOTE — Progress Notes (Signed)
[facility]  PATIENT: Sandra Sandra [name] DOB: Dec 27, 1955   REASON FOR VISIT: Follow-up for cerebral infarction HISTORY FROM: Patient and daughter to translate    HISTORY OF PRESENT ILLNESS:Update 8/3/17CM Sandra Sandra [name], 60 year old female returns for follow-up. She has a history of stroke event in November of last year. She speaks little [place] . She is currently on aspirin for secondary stroke prevention without further stroke or TIA symptoms. No rash or petechiae seen. Her Lipitor was changed to Zocor by her primary care. Blood pressure is elevated in the office today she has not taken her blood pressure medicine this  morning . She remains independent in activities of daily living. She has had no new neurologic complaints she returns for reevaluation  History2/2/17PSMs Sandra [name] is a 60 year old African-American lady originally from [place],, [place] who seen for first office visit following hospital admission for stroke in November 2016. She is accompanied by her daughter who translates for her as patient speaks limited [place]. Patient apparently was admitted 2 days after Thanksgiving to [facility] with left facial and hand weakness of a few days duration. She did not seek medical help right away. Unfortunately I am unable to locate's patient's medical records in the [facility] system which is surprising as patient apparently was admitted there for 3 days. She underwent stroke evaluation including MRI scan echocardiogram and Doppler studies which I do not have the results for. She was started on aspirin for stroke prevention and Lipitor for hyperlipidemia. Patient was apparently taking blood pressure medicines at her home and Sandra Sandra [name] but currently she is not on antihypertensives. Her blood pressure today is elevated at 170/100. She has opted improvement in her speech, facial and hand weakness. She is able to do all her activities of daily living and is independent. She  is tolerating aspirin and Lipitor without any side effects   REVIEW OF SYSTEMS: Full 14 system review of systems performed and notable only for those listed, all others are neg:  Constitutional: neg  Cardiovascular: neg Ear/Nose/Throat: neg  Skin: neg Eyes: neg Respiratory: Cough Gastroitestinal: neg  Hematology/Lymphatic: neg  Endocrine: neg Musculoskeletal:neg Allergy/Immunology: neg Neurological: Occasional headache Psychiatric: neg Sleep : neg   ALLERGIES: No Known Allergies  HOME MEDICATIONS: Outpatient Medications Prior to Visit  Medication Sig Dispense Refill  . aspirin 81 MG EC tablet Take 1 tablet (81 mg total) by mouth daily. 30 tablet 0  . aspirin 81 MG tablet Take 81 mg by mouth daily.    Marland Kitchen atorvastatin (LIPITOR) 40 MG tablet Take 40 mg by mouth daily.    . feeding supplement, ENSURE ENLIVE, (ENSURE ENLIVE) LIQD Take 237 mLs by mouth 2 (two) times daily between meals. 237 mL 12  . lisinopril (PRINIVIL,ZESTRIL) 5 MG tablet Take 1 tablet (5 mg total) by mouth daily. 60 tablet 1  . Multiple Vitamins-Calcium (ONE-A-DAY WOMENS PO) Take 1 tablet by mouth daily.    Marland Kitchen oxyCODONE-acetaminophen (PERCOCET/ROXICET) 5-325 MG per tablet Take 1-2 tablets by mouth every 6 (six) hours as needed for pain. 15 tablet 0  . senna-docusate (SENOKOT-S) 8.6-50 MG tablet Take 1 tablet by mouth at bedtime as needed for mild constipation or moderate constipation. 30 tablet 0  . simvastatin (ZOCOR) 40 MG tablet Take 1 tablet (40 mg total) by mouth daily at 6 PM. 30 tablet 0   No facility-administered medications prior to visit.     PAST MEDICAL HISTORY: Past Medical History:  Diagnosis Date  . Arthritis   . Headache   .  Hypertension   . Stroke [facility])     PAST SURGICAL HISTORY: No past surgical history on file.  FAMILY HISTORY: No family history on file.  SOCIAL HISTORY: Social History   Social History  . Marital status: Single    Spouse name: N/A  . Number of children: N/A    . Years of education: N/A   Occupational History  . Not on file.   Social History Main Topics  . Smoking status: Never Smoker  . Smokeless tobacco: Never Used  . Alcohol use No  . Drug use: No  . Sexual activity: Not on file   Other Topics Concern  . Not on file   Social History Narrative   ** Merged History Encounter **         PHYSICAL EXAM  Vitals:   08/09/15 1517  BP: (!) 180/79  Pulse: (!) 59  Weight: 123 lb 3.2 oz (55.9 kg)  Height: 5' (1.524 m)   Body mass index is 24.06 kg/m. General: Middle-aged [name] seated, in no evident distress Head: head normocephalic and atraumatic.  Neck: supple with no carotid  bruits Cardiovascular: regular rate and rhythm, no murmurs Musculoskeletal: no deformity Skin:  no rash/petichiae Vascular:  Normal pulses all extremities  Neurological examination  Mental Status: Awake and fully alert. Oriented to place and time. Recent and remote memory intact. Attention span, concentration and fund of knowledge appropriate. Mood and affect appropriate. Answers all questions through interpreter Cranial Nerves: Fundoscopic exam reveals sharp disc margins. Pupils equal, briskly reactive to light. Extraocular movements full without nystagmus. Visual fields full to confrontation. Hearing intact. Minimum left nasolabial fold asymmetry when she smiles. Facial sensation intact. Face, tongue, palate moves normally and symmetrically.  Motor: Normal bulk and tone. Normal strength in all tested extremity muscles. Diminished fine finger movements on the left. Minimum left grip weakness. Orbits right over left approximately. Sensory.: intact to touch ,pinprick .position and vibratory sensation in the upper and lower extremities.  Coordination: Rapid alternating movements normal in all extremities. Finger-to-nose and heel-to-shin performed accurately bilaterally. Gait and Station: Arises from chair without difficulty. Stance is normal. Gait  demonstrates normal stride length and balance . Able to heel, toe and tandem walk without difficulty.  Reflexes: 1+ and symmetric. Toes downgoing.    DIAGNOSTIC DATA (LABS, IMAGING, TESTING) - I reviewed patient records, labs, notes, testing and imaging myself where available.  Lab Results  Component Value Date   WBC 7.8 12/09/2014   HGB 13.7 12/09/2014   HCT 41.8 12/09/2014   MCV 88.0 12/09/2014   PLT 217 12/09/2014      Component Value Date/Time   NA 132 (L) 12/09/2014 0521   K 4.4 12/09/2014 0521   CL 101 12/09/2014 0521   CO2 23 12/09/2014 0521   GLUCOSE 104 (H) 12/09/2014 0521   BUN 16 12/09/2014 0521   CREATININE 0.75 12/09/2014 0521   CALCIUM 9.5 12/09/2014 0521   PROT 9.3 (H) 12/07/2014 1455   ALBUMIN 4.2 12/07/2014 1455   AST 25 12/07/2014 1455   ALT 17 12/07/2014 1455   ALKPHOS 64 12/07/2014 1455   BILITOT 0.7 12/07/2014 1455   GFRNONAA >60 12/09/2014 0521   GFRAA >60 12/09/2014 0521   Lab Results  Component Value Date   CHOL 231 (H) 12/08/2014   HDL 35 (L) 12/08/2014   LDLCALC 170 (H) 12/08/2014   TRIG 128 12/08/2014   CHOLHDL 6.6 12/08/2014   Lab Results  Component Value Date   HGBA1C 6.1 (H)  12/07/2014   Lab Results  Component Value Date   VITAMINB12 1,282 (H) 12/08/2014   Lab Results  Component Value Date   TSH 1.772 12/08/2014      ASSESSMENT AND PLAN 86 year [name] with right brain subcortical infarct in November 2016. Vascular risk factors of hypertension and hyperlipidemia   PLAN: Stressed the importance of management of risk factors to prevent further stroke Continue Aspirin for secondary stroke prevention Maintain strict control of hypertension with blood pressure goal below 130/90, today's reading180/75 continue antihypertensive medications take med when you get home Cholesterol with LDL cholesterol less than 70, followed by primary care,  continue Zocor  Exercise by walking, slowly increase , eat healthy diet with  whole grains,  fresh fruits and vegetables Stay well hydrated Follow-up in 6 months Dennie Bible, [facility], [facility], APRN  [facility] [address] [place], [place] [address] [phone]

## 2015-08-14 NOTE — Progress Notes (Signed)
I agree with the above plan 

## 2015-11-13 ENCOUNTER — Other Ambulatory Visit: Payer: Self-pay | Admitting: Family Medicine

## 2015-11-13 ENCOUNTER — Other Ambulatory Visit (HOSPITAL_COMMUNITY)
Admission: RE | Admit: 2015-11-13 | Discharge: 2015-11-13 | Disposition: A | Discharge status: Home / Self Care | Payer: Self-pay | Source: Ambulatory Visit | Attending: Family Medicine | Admitting: Family Medicine

## 2015-11-13 DIAGNOSIS — Z1151 Encounter for screening for human papillomavirus (HPV): Secondary | ICD-10-CM | POA: Insufficient documentation

## 2015-11-13 DIAGNOSIS — Z01411 Encounter for gynecological examination (general) (routine) with abnormal findings: Secondary | ICD-10-CM | POA: Insufficient documentation

## 2015-11-14 LAB — CYTOLOGY - PAP
DIAGNOSIS: NEGATIVE
HPV (WINDOPATH): NOT DETECTED

## 2015-11-26 ENCOUNTER — Other Ambulatory Visit: Payer: Self-pay | Admitting: Family Medicine

## 2015-11-26 DIAGNOSIS — Z1231 Encounter for screening mammogram for malignant neoplasm of breast: Secondary | ICD-10-CM

## 2015-12-21 ENCOUNTER — Ambulatory Visit: Payer: Self-pay

## 2016-01-30 ENCOUNTER — Inpatient Hospital Stay: Admission: RE | Admit: 2016-01-30 | Payer: Self-pay | Source: Ambulatory Visit

## 2016-02-08 ENCOUNTER — Ambulatory Visit
Admission: RE | Admit: 2016-02-08 | Discharge: 2016-02-08 | Disposition: A | Discharge status: Home / Self Care | Payer: BLUE CROSS/BLUE SHIELD | Source: Ambulatory Visit | Attending: Family Medicine | Admitting: Family Medicine

## 2016-02-08 DIAGNOSIS — Z1231 Encounter for screening mammogram for malignant neoplasm of breast: Secondary | ICD-10-CM

## 2016-02-12 ENCOUNTER — Ambulatory Visit: Payer: BLUE CROSS/BLUE SHIELD | Admitting: Nurse Practitioner

## 2016-02-12 ENCOUNTER — Telehealth: Payer: Self-pay

## 2016-02-12 NOTE — Telephone Encounter (Signed)
Patient did not show to appt today  

## 2016-02-13 ENCOUNTER — Encounter: Payer: Self-pay | Admitting: Nurse Practitioner

## 2017-11-03 ENCOUNTER — Other Ambulatory Visit: Payer: Self-pay | Admitting: Family Medicine

## 2017-11-03 DIAGNOSIS — Z1231 Encounter for screening mammogram for malignant neoplasm of breast: Secondary | ICD-10-CM

## 2017-11-16 ENCOUNTER — Ambulatory Visit (INDEPENDENT_AMBULATORY_CARE_PROVIDER_SITE_OTHER): Payer: BLUE CROSS/BLUE SHIELD

## 2017-11-16 ENCOUNTER — Encounter (HOSPITAL_COMMUNITY): Payer: Self-pay | Admitting: Emergency Medicine

## 2017-11-16 ENCOUNTER — Ambulatory Visit (HOSPITAL_COMMUNITY)
Admission: EM | Admit: 2017-11-16 | Discharge: 2017-11-16 | Disposition: A | Discharge status: Home / Self Care | Payer: BLUE CROSS/BLUE SHIELD | Attending: Family Medicine | Admitting: Family Medicine

## 2017-11-16 DIAGNOSIS — R05 Cough: Secondary | ICD-10-CM

## 2017-11-16 DIAGNOSIS — R059 Cough, unspecified: Secondary | ICD-10-CM

## 2017-11-16 DIAGNOSIS — R062 Wheezing: Secondary | ICD-10-CM

## 2017-11-16 DIAGNOSIS — R5383 Other fatigue: Secondary | ICD-10-CM

## 2017-11-16 MED ORDER — PREDNISONE 10 MG (21) PO TBPK: ORAL_TABLET | Freq: Every day | ORAL | 0 refills | Status: DC

## 2017-11-16 MED ORDER — BENZONATATE 100 MG PO CAPS: 100.0000 mg | ORAL_CAPSULE | Freq: Three times a day (TID) | ORAL | 0 refills | Status: DC

## 2017-11-16 NOTE — ED Provider Notes (Signed)
[facility]   [id number] 11/16/17 Arrival Time: 1033  ASSESSMENT & PLAN:  1. Cough   2. Wheezing    I have personally viewed the imaging studies ordered this visit. No signs of pneumonia seen. O2 saturation normal. No respiratory distress.  Meds ordered this encounter  Medications  . predniSONE (STERAPRED UNI-PAK 21 TAB) 10 MG (21) TBPK tablet    Sig: Take by mouth daily. Take as directed.    Dispense:  21 tablet    Refill:  0  . benzonatate (TESSALON) 100 MG capsule    Sig: Take 1 capsule (100 mg total) by mouth every 8 (eight) hours.    Dispense:  21 capsule    Refill:  0   Suspect viral etiology at this time. Discussed typical duration of symptoms. OTC symptom care as needed. Ensure adequate fluid intake and rest.  Follow-up Information    [facility].   Specialty:  Urgent Care Why:  As needed or with any worsening symptoms over the next few days. Contact information: [place] [phone]         Reviewed expectations re: course of current medical issues. Questions answered. Outlined signs and symptoms indicating need for more acute intervention. Patient verbalized understanding. After Visit Summary given.   SUBJECTIVE: History from: patient and family.  Sandra [name] is a 62 y.o. female who presents with complaint of nasal congestion, post-nasal drainage, and a persistent non-productive cough. Onset abrupt, 5-7 days ago. Overall with mild fatigue and without body aches. SOB: none. Wheezing: mild to moderate; sporadic; especially with coughing. No chest pain. No h/o asthma. Fever: unsure; questions subjective; occasional chills. Overall normal PO intake without n/v. Sick contacts: no. No specific or significant aggravating or alleviating factors reported. No skin rashes. OTC treatment: none. Ambulatory without difficulty.  Received flu shot this year: no.  Social History     Tobacco Use  Smoking Status Never Smoker  Smokeless Tobacco Never Used   ROS: As per HPI. All other systems negative.   OBJECTIVE:  Vitals:   11/16/17 1111  BP: (!) 186/95  Pulse: 76  Resp: 18  Temp: 98.8 F (37.1 C)  TempSrc: Oral  SpO2: 98%    General appearance: alert; appears fatigued HEENT: nasal congestion; clear runny nose; mild throat irritation secondary to post-nasal drainage Neck: supple without LAD; FROM CV: RRR without murmer Abd: soft; non-tender Lungs: unlabored respirations, symmetrical air entry with moderate expiratory wheezing (R>L); cough: moderate Ext: no LE edema Skin: warm and dry Neuro: normal gait Psychological: alert and cooperative; normal mood and affect  Imaging: Dg Chest 2 View  Result Date: 11/16/2017 CLINICAL DATA:  Cough for 5 days, productive cough with wheezing, fatigue, history hypertension EXAM: CHEST - 2 VIEW COMPARISON:  05/03/2012 FINDINGS: Enlargement of cardiac silhouette. Mediastinal contours and pulmonary vascularity normal. Lungs clear. No infiltrate, pleural effusion, or pneumothorax. Bones unremarkable. IMPRESSION: Enlargement of cardiac silhouette. No acute abnormalities. Electronically Signed   By: Lavonia Dana M.D.   On: 11/16/2017 11:39    No Known Allergies  Past Medical History:  Diagnosis Date  . Arthritis   . Headache   . Hypertension   . Stroke [facility])    FH: question HTN  Social History   Socioeconomic History  . Marital status: Single    Spouse name: Not on file  . Number of children: Not on file  . Years of education: Not on file  . Highest  education level: Not on file  Occupational History  . Not on file  Social Needs  . Financial resource strain: Not on file  . Food insecurity:    Worry: Not on file    Inability: Not on file  . Transportation needs:    Medical: Not on file    Non-medical: Not on file  Tobacco Use  . Smoking status: Never Smoker  . Smokeless tobacco: Never Used  Substance  and Sexual Activity  . Alcohol use: No  . Drug use: No  . Sexual activity: Not on file  Lifestyle  . Physical activity:    Days per week: Not on file    Minutes per session: Not on file  . Stress: Not on file  Relationships  . Social connections:    Talks on phone: Not on file    Gets together: Not on file    Attends religious service: Not on file    Active member of club or organization: Not on file    Attends meetings of clubs or organizations: Not on file    Relationship status: Not on file  . Intimate partner violence:    Fear of current or ex partner: Not on file    Emotionally abused: Not on file    Physically abused: Not on file    Forced sexual activity: Not on file  Other Topics Concern  . Not on file  Social History Narrative   ** Merged History Encounter Vanessa Kick, MD 11/16/17 1149

## 2017-11-16 NOTE — ED Triage Notes (Signed)
Pt here for cough x 5 days

## 2017-11-25 ENCOUNTER — Inpatient Hospital Stay (HOSPITAL_BASED_OUTPATIENT_CLINIC_OR_DEPARTMENT_OTHER)
Admission: EM | Admit: 2017-11-25 | Discharge: 2017-11-28 | DRG: 065 | Disposition: A | Discharge status: Home / Self Care | Payer: BLUE CROSS/BLUE SHIELD | Attending: Family Medicine | Admitting: Family Medicine

## 2017-11-25 ENCOUNTER — Emergency Department (HOSPITAL_COMMUNITY): Payer: BLUE CROSS/BLUE SHIELD

## 2017-11-25 ENCOUNTER — Encounter (HOSPITAL_BASED_OUTPATIENT_CLINIC_OR_DEPARTMENT_OTHER): Payer: Self-pay | Admitting: Emergency Medicine

## 2017-11-25 ENCOUNTER — Other Ambulatory Visit (HOSPITAL_COMMUNITY): Payer: BLUE CROSS/BLUE SHIELD

## 2017-11-25 ENCOUNTER — Inpatient Hospital Stay (HOSPITAL_COMMUNITY): Payer: BLUE CROSS/BLUE SHIELD

## 2017-11-25 ENCOUNTER — Emergency Department (HOSPITAL_BASED_OUTPATIENT_CLINIC_OR_DEPARTMENT_OTHER): Payer: BLUE CROSS/BLUE SHIELD

## 2017-11-25 ENCOUNTER — Other Ambulatory Visit: Payer: Self-pay

## 2017-11-25 DIAGNOSIS — I6789 Other cerebrovascular disease: Secondary | ICD-10-CM | POA: Diagnosis not present

## 2017-11-25 DIAGNOSIS — I63331 Cerebral infarction due to thrombosis of right posterior cerebral artery: Secondary | ICD-10-CM

## 2017-11-25 DIAGNOSIS — R29703 NIHSS score 3: Secondary | ICD-10-CM | POA: Diagnosis present

## 2017-11-25 DIAGNOSIS — I10 Essential (primary) hypertension: Secondary | ICD-10-CM | POA: Diagnosis not present

## 2017-11-25 DIAGNOSIS — J0111 Acute recurrent frontal sinusitis: Secondary | ICD-10-CM | POA: Diagnosis present

## 2017-11-25 DIAGNOSIS — H6690 Otitis media, unspecified, unspecified ear: Secondary | ICD-10-CM

## 2017-11-25 DIAGNOSIS — J209 Acute bronchitis, unspecified: Secondary | ICD-10-CM | POA: Diagnosis not present

## 2017-11-25 DIAGNOSIS — H1132 Conjunctival hemorrhage, left eye: Secondary | ICD-10-CM | POA: Diagnosis present

## 2017-11-25 DIAGNOSIS — E785 Hyperlipidemia, unspecified: Secondary | ICD-10-CM | POA: Diagnosis present

## 2017-11-25 DIAGNOSIS — E119 Type 2 diabetes mellitus without complications: Secondary | ICD-10-CM | POA: Diagnosis present

## 2017-11-25 DIAGNOSIS — I739 Peripheral vascular disease, unspecified: Secondary | ICD-10-CM | POA: Diagnosis present

## 2017-11-25 DIAGNOSIS — J40 Bronchitis, not specified as acute or chronic: Secondary | ICD-10-CM

## 2017-11-25 DIAGNOSIS — R29810 Facial weakness: Secondary | ICD-10-CM | POA: Diagnosis present

## 2017-11-25 DIAGNOSIS — Z8673 Personal history of transient ischemic attack (TIA), and cerebral infarction without residual deficits: Secondary | ICD-10-CM | POA: Diagnosis not present

## 2017-11-25 DIAGNOSIS — I708 Atherosclerosis of other arteries: Secondary | ICD-10-CM | POA: Diagnosis present

## 2017-11-25 DIAGNOSIS — B20 Human immunodeficiency virus [HIV] disease: Secondary | ICD-10-CM | POA: Diagnosis not present

## 2017-11-25 DIAGNOSIS — R200 Anesthesia of skin: Secondary | ICD-10-CM | POA: Diagnosis present

## 2017-11-25 DIAGNOSIS — I6350 Cerebral infarction due to unspecified occlusion or stenosis of unspecified cerebral artery: Principal | ICD-10-CM | POA: Diagnosis present

## 2017-11-25 DIAGNOSIS — R278 Other lack of coordination: Secondary | ICD-10-CM | POA: Diagnosis present

## 2017-11-25 DIAGNOSIS — R531 Weakness: Secondary | ICD-10-CM

## 2017-11-25 DIAGNOSIS — R490 Dysphonia: Secondary | ICD-10-CM | POA: Diagnosis present

## 2017-11-25 DIAGNOSIS — E441 Mild protein-calorie malnutrition: Secondary | ICD-10-CM | POA: Diagnosis present

## 2017-11-25 DIAGNOSIS — Z79899 Other long term (current) drug therapy: Secondary | ICD-10-CM

## 2017-11-25 DIAGNOSIS — Z21 Asymptomatic human immunodeficiency virus [HIV] infection status: Secondary | ICD-10-CM | POA: Diagnosis present

## 2017-11-25 DIAGNOSIS — H6691 Otitis media, unspecified, right ear: Secondary | ICD-10-CM | POA: Diagnosis present

## 2017-11-25 DIAGNOSIS — J0110 Acute frontal sinusitis, unspecified: Secondary | ICD-10-CM

## 2017-11-25 DIAGNOSIS — I639 Cerebral infarction, unspecified: Secondary | ICD-10-CM | POA: Diagnosis present

## 2017-11-25 DIAGNOSIS — Z9119 Patient's noncompliance with other medical treatment and regimen: Secondary | ICD-10-CM | POA: Diagnosis not present

## 2017-11-25 LAB — DIFFERENTIAL
Abs Immature Granulocytes: 0.13 10*3/uL — ABNORMAL HIGH (ref 0.00–0.07)
BASOS PCT: 0 %
Basophils Absolute: 0 10*3/uL (ref 0.0–0.1)
EOS ABS: 0.2 10*3/uL (ref 0.0–0.5)
Eosinophils Relative: 3 %
Immature Granulocytes: 2 %
LYMPHS ABS: 1.9 10*3/uL (ref 0.7–4.0)
Lymphocytes Relative: 26 %
MONO ABS: 1.3 10*3/uL — AB (ref 0.1–1.0)
MONOS PCT: 17 %
NEUTROS PCT: 52 %
Neutro Abs: 3.8 10*3/uL (ref 1.7–7.7)

## 2017-11-25 LAB — RAPID URINE DRUG SCREEN, HOSP PERFORMED
AMPHETAMINES: NOT DETECTED
Barbiturates: NOT DETECTED
Benzodiazepines: NOT DETECTED
Cocaine: NOT DETECTED
Opiates: NOT DETECTED
TETRAHYDROCANNABINOL: NOT DETECTED

## 2017-11-25 LAB — URINALYSIS, MICROSCOPIC (REFLEX)

## 2017-11-25 LAB — COMPREHENSIVE METABOLIC PANEL
ALT: 33 U/L (ref 0–44)
AST: 32 U/L (ref 15–41)
Albumin: 3.9 g/dL (ref 3.5–5.0)
Alkaline Phosphatase: 56 U/L (ref 38–126)
Anion gap: 15 (ref 5–15)
BILIRUBIN TOTAL: 1 mg/dL (ref 0.3–1.2)
BUN: 10 mg/dL (ref 8–23)
CO2: 21 mmol/L — ABNORMAL LOW (ref 22–32)
CREATININE: 0.77 mg/dL (ref 0.44–1.00)
Calcium: 9.3 mg/dL (ref 8.9–10.3)
Chloride: 97 mmol/L — ABNORMAL LOW (ref 98–111)
Glucose, Bld: 110 mg/dL — ABNORMAL HIGH (ref 70–99)
POTASSIUM: 3.8 mmol/L (ref 3.5–5.1)
Sodium: 133 mmol/L — ABNORMAL LOW (ref 135–145)
TOTAL PROTEIN: 9.5 g/dL — AB (ref 6.5–8.1)

## 2017-11-25 LAB — CBC
HCT: 41.1 % (ref 36.0–46.0)
Hemoglobin: 13.1 g/dL (ref 12.0–15.0)
MCH: 28.2 pg (ref 26.0–34.0)
MCHC: 31.9 g/dL (ref 30.0–36.0)
MCV: 88.4 fL (ref 80.0–100.0)
PLATELETS: 226 10*3/uL (ref 150–400)
RBC: 4.65 MIL/uL (ref 3.87–5.11)
RDW: 13 % (ref 11.5–15.5)
WBC: 7.3 10*3/uL (ref 4.0–10.5)
nRBC: 0 % (ref 0.0–0.2)

## 2017-11-25 LAB — APTT: APTT: 32 s (ref 24–36)

## 2017-11-25 LAB — URINALYSIS, ROUTINE W REFLEX MICROSCOPIC
Bilirubin Urine: NEGATIVE
Glucose, UA: NEGATIVE mg/dL
Hgb urine dipstick: NEGATIVE
KETONES UR: NEGATIVE mg/dL
LEUKOCYTES UA: NEGATIVE
NITRITE: NEGATIVE
PROTEIN: 30 mg/dL — AB
Specific Gravity, Urine: 1.015 (ref 1.005–1.030)
pH: 8 (ref 5.0–8.0)

## 2017-11-25 LAB — ETHANOL

## 2017-11-25 LAB — PROTIME-INR
INR: 1.05
PROTHROMBIN TIME: 13.6 s (ref 11.4–15.2)

## 2017-11-25 LAB — TROPONIN I

## 2017-11-25 MED ORDER — AMOXICILLIN-POT CLAVULANATE 875-125 MG PO TABS
1.0000 | ORAL_TABLET | Freq: Once | ORAL | Status: AC
  Administered 2017-11-25: 1 via ORAL
  Filled 2017-11-25: qty 1

## 2017-11-25 MED ORDER — ENOXAPARIN SODIUM 40 MG/0.4ML ~~LOC~~ SOLN
40.0000 mg | SUBCUTANEOUS | Status: DC
  Administered 2017-11-25 – 2017-11-27 (×3): 40 mg via SUBCUTANEOUS
  Filled 2017-11-25 (×3): qty 0.4

## 2017-11-25 MED ORDER — AMOXICILLIN-POT CLAVULANATE 875-125 MG PO TABS
1.0000 | ORAL_TABLET | Freq: Two times a day (BID) | ORAL | Status: DC
  Administered 2017-11-25 – 2017-11-28 (×6): 1 via ORAL
  Filled 2017-11-25 (×7): qty 1

## 2017-11-25 MED ORDER — ASPIRIN EC 81 MG PO TBEC
81.0000 mg | DELAYED_RELEASE_TABLET | Freq: Every day | ORAL | Status: DC
  Administered 2017-11-25 – 2017-11-28 (×4): 81 mg via ORAL
  Filled 2017-11-25 (×4): qty 1

## 2017-11-25 MED ORDER — SIMVASTATIN 40 MG PO TABS
40.0000 mg | ORAL_TABLET | Freq: Every day | ORAL | Status: DC
  Administered 2017-11-25 – 2017-11-27 (×3): 40 mg via ORAL
  Filled 2017-11-25 (×3): qty 1

## 2017-11-25 MED ORDER — STROKE: EARLY STAGES OF RECOVERY BOOK
Freq: Once | Status: AC
  Administered 2017-11-25: 18:00:00
  Filled 2017-11-25 (×2): qty 1

## 2017-11-25 MED ORDER — SODIUM CHLORIDE 0.9 % IV SOLN
INTRAVENOUS | Status: DC
  Administered 2017-11-25 – 2017-11-26 (×3): via INTRAVENOUS

## 2017-11-25 MED ORDER — ACETAMINOPHEN 325 MG PO TABS
650.0000 mg | ORAL_TABLET | ORAL | Status: DC | PRN
  Administered 2017-11-26 (×2): 650 mg via ORAL
  Filled 2017-11-25 (×2): qty 2

## 2017-11-25 MED ORDER — ACETAMINOPHEN 650 MG RE SUPP: 650.0000 mg | RECTAL | Status: DC | PRN

## 2017-11-25 MED ORDER — ACETAMINOPHEN 160 MG/5ML PO SOLN: 650.0000 mg | ORAL | Status: DC | PRN

## 2017-11-25 NOTE — Discharge Instructions (Signed)
Go to the [facility] emergency department.  I have ordered an MRI for you.  Dr. Vallery Ridge was the accepting ED physician.

## 2017-11-25 NOTE — ED Notes (Signed)
Neurology at the bedside

## 2017-11-25 NOTE — Plan of Care (Signed)
  Problem: Education: Goal: Knowledge of disease or condition will improve 11/25/2017 1855 by Linton Flemings, RN Outcome: Progressing 11/25/2017 1854 by Linton Flemings, RN Outcome: Progressing Goal: Knowledge of secondary prevention will improve 11/25/2017 1855 by Linton Flemings, RN Outcome: Progressing 11/25/2017 1854 by Linton Flemings, RN Outcome: Progressing   Problem: Coping: Goal: Will verbalize positive feelings about self Outcome: Progressing   Problem: Self-Care: Goal: Ability to participate in self-care as condition permits will improve Outcome: Progressing Goal: Verbalization of feelings and concerns over difficulty with self-care will improve Outcome: Progressing Goal: Ability to communicate needs accurately will improve Outcome: Progressing   Problem: Nutrition: Goal: Dietary intake will improve Outcome: Progressing   Problem: Spontaneous Subarachnoid Hemorrhage Tissue Perfusion: Goal: Complications of Spontaneous Subarachnoid Hemorrhage will be minimized Outcome: Progressing

## 2017-11-25 NOTE — ED Notes (Signed)
Regular lunch diet tray ordered. S/w Tatiana in [facility].

## 2017-11-25 NOTE — ED Provider Notes (Signed)
9:29 AM Patient transferred from [facility] for MRI and neurological evaluation.  CT scan showed concern for acute stroke as the cause of left-sided symptoms.  On my exam, patient has numbness in left face, left arm, left leg.  I do not appreciate weakness in the face nor weakness with her arm or leg.  Lungs were clear chest was nontender.  Patient had some congestion.  Lungs were clear chest was nontender.  Facial droop.  Extraocular movements.  Patient will have MRI and will touch base with neurology to determine disposition.  12:11 PM MRI shows acute stroke in the right thalamus.  This appears to correspond to the left-sided numbness in left face, left arm, and left leg.  Next  Neurology was called who will come see patient.  They requested patient be admitted to medicine for further management.  Medicine will be called for admission.   Clinical Impression: 1. Left-sided weakness   2. Acute frontal sinusitis, recurrence not specified   3. Numbness   4. Cerebrovascular accident (CVA), unspecified mechanism ([place])     Disposition: Admit  This note was prepared with assistance of Dragon voice recognition software. Occasional wrong-word or sound-a-like substitutions may have occurred due to the inherent limitations of voice recognition software.     Tegeler, Gwenyth Allegra, MD 11/25/17 1650

## 2017-11-25 NOTE — ED Triage Notes (Addendum)
Pt c/o headache and numbness in left arm and left leg since midnight. Pt also c/o productive cough x 2 weeks which is not improving.

## 2017-11-25 NOTE — ED Notes (Signed)
Patient in MRI at this time. 

## 2017-11-25 NOTE — H&P (Signed)
History and Physical    Sandra [name] HUD:[id number] DOB: 1955-03-16 DOA: 11/25/2017  PCP: [facility] [place] Consultants: Neurology Patient coming from: Home- lives with son, grandchildren.  8 people live in the home  Chief Complaint: Facial numbness  HPI: Sandra [name] is a 62 y.o. female with medical history significant for hypertension, hyperlipidemia and prior stroke 2016 with no residual who presented to the ED today with c/o numbness of her L face, left upper and left lower extremity.  Patient son is at the bedside and acts as Optometrist.  She has had URI symptoms for approximately 2 weeks.  Mostly nasal congestion and a dry cough.  She started to get better about a week ago but then cough worsened.  She was seen in the ED on November 11 and thought to have a viral URI and given Tessalon and a steroid taper which she just completed.  However, her symptoms have not resolved.  She has had no fever or chills.  She does complain of right ear pain.  This morning around 4:00 she woke up and had total numbness of her left face, left arm and left leg which prompted her son to bring her to the ED at [facility].  She denies focal weakness but states that she has not eaten today and therefore feels generally weak. She does not take any aspirin or any other blood thinners at home.  She takes pravastatin and lisinopril.  No other medications, no over-the-counter, no supplements.  She has not had any sick contacts.   ED Course:  At [facility]: Temperature 99.2, blood pressure 169/97, pulse 81, respirations 18, 94% on room air, weight 55 kg.  Her exam there revealed swollen turbinates, posterior nasal drip, purulent effusion of the right tympanic membrane without bulging.  Exam revealed a loss of sensation to the left face, left arm and left leg.  Motor exam was normal.  CT of the head without contrast showed an acute appearing infarct involving the  portions of the right mid and lateral occipital lobes with localized sulcal effacement, questionable smaller acute infarct in the mid left occipital lobe, multifocal paranasal sinus disease.  She was transferred to [facility] for further evaluation.  Here, MRI showed an acute stroke in the right thalamus which corresponded to her left-sided numbness in her face, arm and leg. Neurology was consulted and requested patient be admitted to medicine for admission.  Review of Systems: As per HPI; otherwise review of systems reviewed and negative.   Ambulatory Status:  Ambulates without assistance  Past Medical History:  Diagnosis Date  . Arthritis   . Headache   . Hypertension   . Stroke [facility])     History reviewed. No pertinent surgical history.  Social History   Socioeconomic History  . Marital status: Single    Spouse name: Not on file  . Number of children: Not on file  . Years of education: Not on file  . Highest education level: Not on file  Occupational History  . Not on file  Social Needs  . Financial resource strain: Not on file  . Food insecurity:    Worry: Not on file    Inability: Not on file  . Transportation needs:    Medical: Not on file    Non-medical: Not on file  Tobacco Use  . Smoking status: Never Smoker  . Smokeless tobacco: Never Used  Substance and Sexual Activity  . Alcohol use:  No  . Drug use: No  . Sexual activity: Not on file  Lifestyle  . Physical activity:    Days per week: Not on file    Minutes per session: Not on file  . Stress: Not on file  Relationships  . Social connections:    Talks on phone: Not on file    Gets together: Not on file    Attends religious service: Not on file    Active member of club or organization: Not on file    Attends meetings of clubs or organizations: Not on file    Relationship status: Not on file  . Intimate partner violence:    Fear of current or ex partner: Not on file    Emotionally abused: Not on file     Physically abused: Not on file    Forced sexual activity: Not on file  Other Topics Concern  . Not on file  Social History Narrative   ** Merged History Encounter **        No Known Allergies  No family history on file.  Prior to Admission medications   Medication Sig Start Date End Date Taking? Authorizing Provider  feeding supplement, ENSURE ENLIVE, (ENSURE ENLIVE) LIQD Take 237 mLs by mouth 2 (two) times daily between meals. 12/09/14  Yes Reyne Dumas, MD  lisinopril (PRINIVIL,ZESTRIL) 5 MG tablet Take 1 tablet (5 mg total) by mouth daily. 02/08/15  Yes Garvin Fila, MD  simvastatin (ZOCOR) 40 MG tablet Take 1 tablet (40 mg total) by mouth daily at 6 PM. 12/09/14  Yes Reyne Dumas, MD  aspirin 81 MG EC tablet Take 1 tablet (81 mg total) by mouth daily. Patient not taking: Reported on 11/25/2017 12/09/14   Reyne Dumas, MD  benzonatate (TESSALON) 100 MG capsule Take 1 capsule (100 mg total) by mouth every 8 (eight) hours. Patient not taking: Reported on 11/25/2017 11/16/17   Vanessa Kick, MD  oxyCODONE-acetaminophen (PERCOCET/ROXICET) 5-325 MG per tablet Take 1-2 tablets by mouth every 6 (six) hours as needed for pain. Patient not taking: Reported on 11/25/2017 05/03/12   Pixie Casino, MD  predniSONE (STERAPRED UNI-PAK 21 TAB) 10 MG (21) TBPK tablet Take by mouth daily. Take as directed. Patient not taking: Reported on 11/25/2017 11/16/17   Vanessa Kick, MD  senna-docusate (SENOKOT-S) 8.6-50 MG tablet Take 1 tablet by mouth at bedtime as needed for mild constipation or moderate constipation. Patient not taking: Reported on 11/25/2017 12/09/14   Reyne Dumas, MD    Physical Exam: Vitals:   11/25/17 0704 11/25/17 0825 11/25/17 0830 11/25/17 0939  BP: (!) 189/99 (!) 171/97 (!) 169/97 (!) 157/74  Pulse: 85 79 81 83  Resp: 18 15 18  (!) 22  Temp: 99.2 F (37.3 C)     TempSrc: Oral     SpO2: 96% 98% 94% 96%  Weight: 55 kg     Height: 5' (1.524 m)        . General: Appears  generally ill and uncomfortable, wearing a facemask, NAD . Eyes:  PERRL, EOMI, normal lids, iris.  She has bilateral conjunctival injection, right greater than left . ENT:  grossly normal hearing, lips & tongue, mmm.  Tympanic membranes were not examined due to patient's location in the hallway. . Neck:  supple, no lymphadenopathy . Cardiovascular:  nL S1, S2, normal rate, reg rhythm, no murmur. Marland Kitchen Respiratory:   CTA bilaterally with no wheezes/rales/rhonchi.  Normal respiratory effort. . Abdomen:  soft, NT, ND, NABS . Back:  grossly normal alignment . Skin:  no rash or lesions seen on limited exam . Musculoskeletal:  grossly normal tone BUE/BLE, good ROM, no bony abnormality or obvious joint deformity . Lower extremities: No LE edema.  Limited foot exam with no ulcerations.  2+ distal pulses. Marland Kitchen Psychiatric:  grossly normal mood and affect, speech fluent and appropriate, AOx3 . Neurologic: She is alert and oriented x3.  Strength is 5 out of 5 and symmetric in all extremities.  She has no sensation of her left face, left arm and left lower extremity.  Normal DTRs throughout.  Cranial nerves II through XII otherwise intact.  No dysmetria.    Radiological Exams on Admission: Dg Chest 2 View  Result Date: 11/25/2017 CLINICAL DATA:  Left-sided numbness with cough EXAM: CHEST - 2 VIEW COMPARISON:  November 16, 2017 FINDINGS: There is no evident edema or consolidation. The heart size and pulmonary vascularity are normal. No adenopathy. No bone lesions. No pneumothorax. IMPRESSION: No edema or consolidation. Electronically Signed   By: Lowella Grip III M.D.   On: 11/25/2017 08:15   Ct Head Wo Contrast  Result Date: 11/25/2017 CLINICAL DATA:  Left sided numbness EXAM: CT HEAD WITHOUT CONTRAST TECHNIQUE: Contiguous axial images were obtained from the base of the skull through the vertex without intravenous contrast. COMPARISON:  None. FINDINGS: Brain: The ventricles are normal in size and  configuration. There is a small cavum septum pellucidum, an anatomic variant. There is decreased attenuation in the mid and lateral right occipital lobe regions with localized sulcal effacement in these areas, felt to represent an acute infarct in this area. There is somewhat more subtle decreased attenuation in the mid left occipital lobe, concerning for a smaller acute infarct in this area as well. There is no mass, hemorrhage, extra-axial fluid collection, or midline shift. Elsewhere brain parenchyma appears unremarkable. There is no demonstrable hyperdense vessel. There is calcification in each carotid siphon region. The bony calvarium appears intact. Skull: Bony calvarium appears intact. Sinuses/Orbits: There is an air-fluid level in the right sphenoid sinus. There is opacification of multiple ethmoid air cells bilaterally. There is opacification of the visualized superior left maxillary antrum. Visualized orbits appear symmetric bilaterally. Other: Mastoid air cells are clear. IMPRESSION: 1. Acute appearing infarct involving portions of the right mid and lateral occipital lobes with localized sulcal effacement. 2. Questionable smaller acute infarct in the mid left occipital lobe. 3. No mass or hemorrhage evident. Brain parenchyma elsewhere appears unremarkable. 4.  There is multifocal paranasal sinus disease. 5.  There are foci of arterial vascular calcification. Electronically Signed   By: Lowella Grip III M.D.   On: 11/25/2017 08:04   Mr Brain Wo Contrast  Result Date: 11/25/2017 CLINICAL DATA:  Heaviness and numbness in the left upper extremity since midnight. Frontal headache EXAM: MRI HEAD WITHOUT CONTRAST TECHNIQUE: Multiplanar, multiecho pulse sequences of the brain and surrounding structures were obtained without intravenous contrast. COMPARISON:  Neck head CT earlier today FINDINGS: Brain: Acute lacunar infarct in the right thalamus. Occipital parietal appearance on previous head CT was  artifactual. No hemorrhage, hydrocephalus, or masslike finding. FLAIR hyperintensity in the pons and periventricular white matter, likely mild chronic small vessel ischemia. Partially empty sella, nonspecific in isolation. Vascular: Major flow voids are preserved Skull and upper cervical spine: Negative for marrow lesion Sinuses/Orbits: Extensive mucosal thickening and secretions in the bilateral paranasal sinuses. IMPRESSION: 1. Acute lacunar infarct in the right thalamus. 2. Advanced generalized sinusitis. Electronically Signed   By: Angelica Chessman  Watts M.D.   On: 11/25/2017 10:54    EKG: Independently reviewed.   Date/Time:                  Wednesday November 25 2017 08:24:55 EST Ventricular Rate:         76 PR Interval:                   QRS Duration: 81 QT Interval:                 346 QTC Calculation:        389 R Axis:                         33 Text Interpretation:       Sinus rhythm Probable anteroseptal infarct, old No old tracing to compare   Labs on Admission: I have personally reviewed the available labs and imaging studies at the time of the admission.  Pertinent labs:  Sodium 133 potassium 3.8 chloride 97 CO2 21 glucose 110 BUN 10 creatinine 0.77 calcium 9.3 LFTs within normal limits Total protein 9.5.  It has been elevated in the past, 9.3 on 12/07/2014 Troponin less than 0.03 WBC 7.3 hemoglobin 13.1 platelets 226 Last lipid panel 12/08/2014: Cholesterol 231, HDL 35, LDL 170 Last hemoglobin A1c 12/07/2014: 6.1  TTE 12/08/2014 - Left ventricle: The cavity size was normal. Systolic function was   normal. The estimated ejection fraction was in the range of 60%   to 65%. Wall motion was normal; there were no regional wall   motion abnormalities. There was an increased relative   contribution of atrial contraction to ventricular filling, which   may be due to hypovolemia. - Atrial septum: No defect or patent foramen ovale was identified.   Assessment/Plan Principal Problem:    Cerebrovascular accident (CVA) due to occlusion of cerebral artery (HCC) Active Problems:   HLD (hyperlipidemia)   Benign essential HTN   CVA (cerebral vascular accident) ([place])   Otitis media   Bronchitis   Acute CVA: Her left facial numbness along with her left upper and lower extremity numbness correspond with acute right thalamic CVA.  Neurology has been consulted.  She is out of the window for TPA.  Risk factors include hypertension, hyperlipidemia. -Admit to inpatient, telemetry -Follow-up neurology recommendations -Check hemoglobin A1c, fasting lipid panel in the morning -Check TTE -PT /OT/ ST -neuro checks  Acute otitis media, bronchitis. -Continue Augmentin 875 mg every 12 hours for 7 to 10 days -Will need complete ear exam once patient is in a room  Hypertension: -Hold BP meds to allow for permissive hypertension  Hyperlipidemia: -Continue pravastatin 40 mg daily   DVT prophylaxis: lovenox [place] Code Status:  Full - confirmed with patient/family Family Communication: son at bedside  Disposition Plan:  Home once clinically improved Consults called: Neurology  Admission status: Admit - It is my clinical opinion that admission to INPATIENT is reasonable and necessary because of the expectation that this patient will require hospital care that crosses at least 2 midnights to treat this condition based on the medical complexity of the problems presented.  Given the aforementioned information, the predictability of an adverse outcome is felt to be significant.     Janora Norlander MD [facility]  If note is complete, please contact covering daytime or nighttime physician. [email or web address] Password [facility]  11/25/2017, 12:37 PM

## 2017-11-25 NOTE — ED Provider Notes (Addendum)
[facility] Provider Note   CSN: [id number] Arrival date & time: 11/25/17  7062     History   Chief Complaint Chief Complaint  Patient presents with  . Numbness    HPI Sandra [name] is a 62 y.o. female.  62 yo F with a chief complaints of heaviness and numbness to her left upper left lower extremity.  Going on since about midnight.  Patient has been having cough and congestion for the past 48 hours.  No known sick contacts no recent travel.  She denies head injury denies neck pain.  She has a headache mostly when she coughs, diffusely about the frontal portion of her head.  Has some chest pain with coughing as well.  Denies fevers or chills.  The patient has had a stroke before that affected the left side of her body.  She denies any continued deficit after physical therapy.  The history is provided by the patient.  Illness  This is a new problem. The current episode started 2 days ago. The problem occurs constantly. The problem has been gradually worsening. Associated symptoms include chest pain (with coughing), headaches (with coughing) and shortness of breath (with coughing). Pertinent negatives include no abdominal pain. Nothing aggravates the symptoms. Nothing relieves the symptoms. She has tried nothing for the symptoms. The treatment provided no relief.    Past Medical History:  Diagnosis Date  . Arthritis   . Headache   . Hypertension   . Stroke [facility])     Patient Active Problem List   Diagnosis Date Noted  . Cerebral infarction ([place]) 02/08/2015  . Stroke with cerebral ischemia ([place])   . Stroke ([place]) 12/08/2014  . Left-sided weakness   . HLD (hyperlipidemia)   . Benign essential HTN   . Cerebrovascular accident (CVA) due to occlusion of cerebral artery ([place])     History reviewed. No pertinent surgical history.   OB History    Gravida  0   Para  0   Term  0   Preterm  0   AB  0   Living        SAB  0   TAB  0   Ectopic  0   Multiple      Live Births               Home Medications    Prior to Admission medications   Medication Sig Start Date End Date Taking? Authorizing Provider  aspirin 81 MG EC tablet Take 1 tablet (81 mg total) by mouth daily. 12/09/14   Reyne Dumas, MD  aspirin 81 MG tablet Take 81 mg by mouth daily.    [provider]  benzonatate (TESSALON) 100 MG capsule Take 1 capsule (100 mg total) by mouth every 8 (eight) hours. 11/16/17   Vanessa Kick, MD  feeding supplement, ENSURE ENLIVE, (ENSURE ENLIVE) LIQD Take 237 mLs by mouth 2 (two) times daily between meals. 12/09/14   Reyne Dumas, MD  lisinopril (PRINIVIL,ZESTRIL) 5 MG tablet Take 1 tablet (5 mg total) by mouth daily. 02/08/15   Garvin Fila, MD  Multiple Vitamins-Calcium (ONE-A-DAY WOMENS PO) Take 1 tablet by mouth daily.    [provider]  oxyCODONE-acetaminophen (PERCOCET/ROXICET) 5-325 MG per tablet Take 1-2 tablets by mouth every 6 (six) hours as needed for pain. 05/03/12   Mabe, Forbes Cellar, MD  predniSONE (STERAPRED UNI-PAK 21 TAB) 10 MG (21) TBPK tablet Take by mouth daily. Take as directed. 11/16/17   Hagler,  Aaron Edelman, MD  senna-docusate (SENOKOT-S) 8.6-50 MG tablet Take 1 tablet by mouth at bedtime as needed for mild constipation or moderate constipation. 12/09/14   Reyne Dumas, MD  simvastatin (ZOCOR) 40 MG tablet Take 1 tablet (40 mg total) by mouth daily at 6 PM. 12/09/14   Reyne Dumas, MD    Family History No family history on file.  Social History Social History   Tobacco Use  . Smoking status: Never Smoker  . Smokeless tobacco: Never Used  Substance Use Topics  . Alcohol use: No  . Drug use: No     Allergies   Patient has no known allergies.   Review of Systems Review of Systems  Constitutional: Negative for chills and fever.  HENT: Negative for congestion and rhinorrhea.   Eyes: Negative for redness and visual disturbance.  Respiratory: Positive for shortness of  breath (with coughing). Negative for wheezing.   Cardiovascular: Positive for chest pain (with coughing). Negative for palpitations.  Gastrointestinal: Negative for abdominal pain, nausea and vomiting.  Genitourinary: Negative for dysuria and urgency.  Musculoskeletal: Negative for arthralgias and myalgias.  Skin: Negative for pallor and wound.  Neurological: Positive for headaches (with coughing). Negative for dizziness.     Physical Exam Updated Vital Signs BP (!) 169/97   Pulse 81   Temp 99.2 F (37.3 C) (Oral)   Resp 18   Ht 5' (1.524 m)   Wt 55 kg   SpO2 94%   BMI 23.68 kg/m   Physical Exam  Constitutional: She is oriented to person, place, and time. She appears well-developed and well-nourished. No distress.  HENT:  Head: Normocephalic and atraumatic.  Swollen turbinates, posterior nasal drip, no noted sinus ttp, right TM with purulent effusion no bulging   Eyes: Pupils are equal, round, and reactive to light. EOM are normal.  Neck: Normal range of motion. Neck supple.  Cardiovascular: Normal rate and regular rhythm. Exam reveals no gallop and no friction rub.  No murmur heard. Pulmonary/Chest: Effort normal. She has no wheezes. She has no rales.  Abdominal: Soft. She exhibits no distension. There is no tenderness.  Musculoskeletal: She exhibits no edema or tenderness.  Neurological: She is alert and oriented to person, place, and time. She has normal strength. A sensory deficit is present. No cranial nerve deficit. She displays a negative Romberg sign. Coordination and gait normal.  Ambulates without difficulty, negative Romberg strength is 5 out of 5 in all 4 extremities.  She has subjective loss of sensation to the right side of her face the left upper extremity and the S1 distribution of the left lower extremity.  Pulse and motor is intact to the left upper and left lower extremity  Skin: Skin is warm and dry. She is not diaphoretic.  Psychiatric: She has a normal  mood and affect. Her behavior is normal.  Nursing note and vitals reviewed.    ED Treatments / Results  Labs (all labs ordered are listed, but only abnormal results are displayed) Labs Reviewed  DIFFERENTIAL - Abnormal; Notable for the following components:      Result Value   Monocytes Absolute 1.3 (*)    Abs Immature Granulocytes 0.13 (*)    All other components within normal limits  COMPREHENSIVE METABOLIC PANEL - Abnormal; Notable for the following components:   Sodium 133 (*)    Chloride 97 (*)    CO2 21 (*)    Glucose, Bld 110 (*)    Total Protein 9.5 (*)    All  other components within normal limits  URINALYSIS, ROUTINE W REFLEX MICROSCOPIC - Abnormal; Notable for the following components:   Protein, ur 30 (*)    All other components within normal limits  URINALYSIS, MICROSCOPIC (REFLEX) - Abnormal; Notable for the following components:   Bacteria, UA FEW (*)    All other components within normal limits  ETHANOL  PROTIME-INR  APTT  CBC  TROPONIN I  RAPID URINE DRUG SCREEN, HOSP PERFORMED    EKG EKG Interpretation  Date/Time:  Wednesday November 25 2017 08:24:55 EST Ventricular Rate:  76 PR Interval:    QRS Duration: 81 QT Interval:  346 QTC Calculation: 389 R Axis:   33 Text Interpretation:  Sinus rhythm Probable anteroseptal infarct, old No old tracing to compare Confirmed by Deno Etienne [phone]) on 11/25/2017 9:17:34 AM   Radiology Dg Chest 2 View  Result Date: 11/25/2017 CLINICAL DATA:  Left-sided numbness with cough EXAM: CHEST - 2 VIEW COMPARISON:  November 16, 2017 FINDINGS: There is no evident edema or consolidation. The heart size and pulmonary vascularity are normal. No adenopathy. No bone lesions. No pneumothorax. IMPRESSION: No edema or consolidation. Electronically Signed   By: Lowella Grip III M.D.   On: 11/25/2017 08:15   Ct Head Wo Contrast  Result Date: 11/25/2017 CLINICAL DATA:  Left sided numbness EXAM: CT HEAD WITHOUT CONTRAST  TECHNIQUE: Contiguous axial images were obtained from the base of the skull through the vertex without intravenous contrast. COMPARISON:  None. FINDINGS: Brain: The ventricles are normal in size and configuration. There is a small cavum septum pellucidum, an anatomic variant. There is decreased attenuation in the mid and lateral right occipital lobe regions with localized sulcal effacement in these areas, felt to represent an acute infarct in this area. There is somewhat more subtle decreased attenuation in the mid left occipital lobe, concerning for a smaller acute infarct in this area as well. There is no mass, hemorrhage, extra-axial fluid collection, or midline shift. Elsewhere brain parenchyma appears unremarkable. There is no demonstrable hyperdense vessel. There is calcification in each carotid siphon region. The bony calvarium appears intact. Skull: Bony calvarium appears intact. Sinuses/Orbits: There is an air-fluid level in the right sphenoid sinus. There is opacification of multiple ethmoid air cells bilaterally. There is opacification of the visualized superior left maxillary antrum. Visualized orbits appear symmetric bilaterally. Other: Mastoid air cells are clear. IMPRESSION: 1. Acute appearing infarct involving portions of the right mid and lateral occipital lobes with localized sulcal effacement. 2. Questionable smaller acute infarct in the mid left occipital lobe. 3. No mass or hemorrhage evident. Brain parenchyma elsewhere appears unremarkable. 4.  There is multifocal paranasal sinus disease. 5.  There are foci of arterial vascular calcification. Electronically Signed   By: Lowella Grip III M.D.   On: 11/25/2017 08:04    Procedures Procedures (including critical care time)  Medications Ordered in ED Medications  amoxicillin-clavulanate (AUGMENTIN) 875-125 MG per tablet 1 tablet (1 tablet Oral Given 11/25/17 0842)     Initial Impression / Assessment and Plan / ED Course  I have  reviewed the triage vital signs and the nursing notes.  Pertinent labs & imaging results that were available during my care of the patient were reviewed by me and considered in my medical decision making (see chart for details).     62 yo F with a chief complaint of left-sided heaviness and numbness.  Going on for the past 7-1/2 hours.  She had symptoms like this when she had a  stroke previously though they are much less severe.  At that time she had left-sided weakness and dizziness.  The patient also has upper respiratory symptoms, clinically has right otitis media.  Will obtain a plain film of the chest.  CT of the head and discussed case with neurology.  I discussed the case with Dr. Cheral Marker, neurology he independently reviewed the CT scan results and was not convinced that they represented an acute stroke.  Recommended ED to ED transfer for an MRI.  The CT scan did show some sinus disease, with her clinically already having otitis we will treat with Augmentin.  I discussed the case with Dr. Vallery Ridge who accepted transfer.  The patients results and plan were reviewed and discussed.   Any x-rays performed were independently reviewed by myself.   Differential diagnosis were considered with the presenting HPI.  Medications  amoxicillin-clavulanate (AUGMENTIN) 875-125 MG per tablet 1 tablet (1 tablet Oral Given 11/25/17 0842)    Vitals:   11/25/17 0704 11/25/17 0825 11/25/17 0830  BP: (!) 189/99 (!) 171/97 (!) 169/97  Pulse: 85 79 81  Resp: 18 15 18   Temp: 99.2 F (37.3 C)    TempSrc: Oral    SpO2: 96% 98% 94%  Weight: 55 kg    Height: 5' (1.524 m)      Final diagnoses:  Left-sided weakness  Acute frontal sinusitis, recurrence not specified      Final Clinical Impressions(s) / ED Diagnoses   Final diagnoses:  Left-sided weakness  Acute frontal sinusitis, recurrence not specified    ED Discharge Orders    None       Deno Etienne, DO 11/25/17 [place], [place],  DO 11/25/17 [phone]

## 2017-11-25 NOTE — ED Notes (Signed)
Patient returned from MRI.

## 2017-11-25 NOTE — Consult Note (Signed)
Referring Physician: Dr. Steffanie Dunn    Chief Complaint: Left sided numbness  HPI: Sandra [name] is an 62 y.o. female who presented to the [facility] ED this AM with a c/c of headache and numbness/heaviness in her LUE and LLE since midnight. She also had a productive cough x 2 weeks with coughing that was so intense it resulted in a subconjunctival hemorrhage in her left eye and exacerbation of her headache. She has a prior history of stroke affecting the left side of her body, which resolved after physical therapy. CT of head at [facility] showed concern for acute stroke. She was sent to the [facility] ED for MRI brain and neurology evaluation.   MRI brain here at [facility] revealed and acute right thalamic ischemic infarction, corresponding to her left-sided numbness.   No recent head injury. No fevers or chills. No neck pain.   Past Medical History:  Diagnosis Date  . Arthritis   . Headache   . Hypertension   . Stroke [facility])     History reviewed. No pertinent surgical history.  No family history on file. Social History:  reports that she has never smoked. She has never used smokeless tobacco. She reports that she does not drink alcohol or use drugs.  Allergies: No Known Allergies  Medications:  Prior to Admission:  Medications Prior to Admission  Medication Sig Dispense Refill Last Dose  . feeding supplement, ENSURE ENLIVE, (ENSURE ENLIVE) LIQD Take 237 mLs by mouth 2 (two) times daily between meals. 237 mL 12 Past Month at Unknown time  . lisinopril (PRINIVIL,ZESTRIL) 5 MG tablet Take 1 tablet (5 mg total) by mouth daily. 60 tablet 1 11/24/2017 at Unknown time  . simvastatin (ZOCOR) 40 MG tablet Take 1 tablet (40 mg total) by mouth daily at 6 PM. 30 tablet 0 11/24/2017 at Unknown time  . aspirin 81 MG EC tablet Take 1 tablet (81 mg total) by mouth daily. (Patient not taking: Reported on 11/25/2017) 30 tablet 0 Not Taking at Unknown time  . benzonatate (TESSALON) 100 MG capsule Take 1 capsule (100 mg total)  by mouth every 8 (eight) hours. (Patient not taking: Reported on 11/25/2017) 21 capsule 0 Not Taking at Unknown time  . oxyCODONE-acetaminophen (PERCOCET/ROXICET) 5-325 MG per tablet Take 1-2 tablets by mouth every 6 (six) hours as needed for pain. (Patient not taking: Reported on 11/25/2017) 15 tablet 0 Not Taking at Unknown time  . predniSONE (STERAPRED UNI-PAK 21 TAB) 10 MG (21) TBPK tablet Take by mouth daily. Take as directed. (Patient not taking: Reported on 11/25/2017) 21 tablet 0 Not Taking at Unknown time  . senna-docusate (SENOKOT-S) 8.6-50 MG tablet Take 1 tablet by mouth at bedtime as needed for mild constipation or moderate constipation. (Patient not taking: Reported on 11/25/2017) 30 tablet 0 Not Taking at Unknown time    ROS: Denies difficulty with speech. No confusion. Other ROS as per HPI.   Physical Examination: Blood pressure (!) 157/74, pulse 83, temperature 99.2 F (37.3 C), temperature source Oral, resp. rate (!) 22, height 5' (1.524 m), weight 55 kg, SpO2 96 %.  HEENT: [place]/AT Lungs: Coughs frequently during the exam.  Ext: No edema  Neurologic Examination: Mental Status: Alert, fully oriented, thought content appropriate.  Speech is primarily in the language of her home country, which is [place]. With son interpreting, her speech is fluent with intact comprehension and naming.  Able to follow all commands without difficulty. Cranial Nerves: II:  Visual fields intact. No extinction to DSS. PERRL.  III,IV, VI: EOMI without nystagmus. No ptosis.  V,VII: No facial droop. Temp sensation and FT decreased on the left. VIII: hearing intact to voice IX,X: Mild hoarseness and hypophonia XI: Symmetric XII: midline tongue extension  Motor: Right : Upper extremity   5/5    Left:     Upper extremity   5/5  Lower extremity   5/5     Lower extremity   5/5 Normal tone throughout; no atrophy noted Sensory: Decreased temp and FT to LUE and LLE. Some trials with DSS reveal extinction  on the left.  Deep Tendon Reflexes:  2+ bilateral biceps, brachioradialis and patellae. Trace achilles bilaterally.  Plantars: Right: downgoing   Left: downgoing Cerebellar: No ataxia with FNF bilaterally Gait: Deferred  Results for orders placed or performed during the hospital encounter of 11/25/17 (from the past 48 hour(s))  Ethanol     Status: None   Collection Time: 11/25/17  7:33 AM  Result Value Ref Range   Alcohol, Ethyl (B) <10 <10 mg/dL    Comment: (NOTE) Lowest detectable limit for serum alcohol is 10 mg/dL. For medical purposes only. Performed at [facility], [address] [place], [place] [address]   Protime-INR     Status: None   Collection Time: 11/25/17  7:33 AM  Result Value Ref Range   Prothrombin Time 13.6 11.4 - 15.2 seconds   INR 1.05     Comment: Performed at [facility], [address] [place], [place] [address]  APTT     Status: None   Collection Time: 11/25/17  7:33 AM  Result Value Ref Range   aPTT 32 24 - 36 seconds    Comment: Performed at [facility], [address] [place], [place] [address]  CBC     Status: None   Collection Time: 11/25/17  7:33 AM  Result Value Ref Range   WBC 7.3 4.0 - 10.5 K/uL   RBC 4.65 3.87 - 5.11 MIL/uL   Hemoglobin 13.1 12.0 - 15.0 g/dL   HCT 41.1 36.0 - 46.0 %   MCV 88.4 80.0 - 100.0 fL   MCH 28.2 26.0 - 34.0 pg   MCHC 31.9 30.0 - 36.0 g/dL   RDW 13.0 11.5 - 15.5 %   Platelets 226 150 - 400 K/uL   nRBC 0.0 0.0 - 0.2 %    Comment: Performed at [facility], [address] [place], [place] [address]  Differential     Status: Abnormal   Collection Time: 11/25/17  7:33 AM  Result Value Ref Range   Neutrophils Relative % 52 %   Neutro Abs 3.8 1.7 - 7.7 K/uL   Lymphocytes Relative 26 %   Lymphs Abs 1.9 0.7 - 4.0 K/uL   Monocytes Relative 17 %   Monocytes Absolute 1.3 (H) 0.1 - 1.0 K/uL   Eosinophils Relative 3 %   Eosinophils Absolute 0.2 0.0 - 0.5 K/uL    Basophils Relative 0 %   Basophils Absolute 0.0 0.0 - 0.1 K/uL   Immature Granulocytes 2 %   Abs Immature Granulocytes 0.13 (H) 0.00 - 0.07 K/uL    Comment: Performed at [facility], [address] [place], [place] [address]  Comprehensive metabolic panel     Status: Abnormal   Collection Time: 11/25/17  7:33 AM  Result Value Ref Range   Sodium 133 (L) 135 - 145 mmol/L   Potassium 3.8 3.5 - 5.1  mmol/L   Chloride 97 (L) 98 - 111 mmol/L   CO2 21 (L) 22 - 32 mmol/L   Glucose, Bld 110 (H) 70 - 99 mg/dL   BUN 10 8 - 23 mg/dL   Creatinine, Ser 0.77 0.44 - 1.00 mg/dL   Calcium 9.3 8.9 - 10.3 mg/dL   Total Protein 9.5 (H) 6.5 - 8.1 g/dL   Albumin 3.9 3.5 - 5.0 g/dL   AST 32 15 - 41 U/L   ALT 33 0 - 44 U/L   Alkaline Phosphatase 56 38 - 126 U/L   Total Bilirubin 1.0 0.3 - 1.2 mg/dL   GFR calc non Af Amer >60 >60 mL/min   GFR calc Af Amer >60 >60 mL/min    Comment: (NOTE) The eGFR has been calculated using the CKD EPI equation. This calculation has not been validated in all clinical situations. eGFR's persistently <60 mL/min signify possible Chronic Kidney Disease.    Anion gap 15 5 - 15    Comment: Performed at [facility], [address] [place], [place] [address]  Troponin I - ONCE - STAT     Status: None   Collection Time: 11/25/17  7:33 AM  Result Value Ref Range   Troponin I <0.03 <0.03 ng/mL    Comment: Performed at [facility], [address] [place], [place] [address]  Urine rapid drug screen (hosp performed)     Status: None   Collection Time: 11/25/17  7:33 AM  Result Value Ref Range   Opiates NONE DETECTED NONE DETECTED   Cocaine NONE DETECTED NONE DETECTED   Benzodiazepines NONE DETECTED NONE DETECTED   Amphetamines NONE DETECTED NONE DETECTED   Tetrahydrocannabinol NONE DETECTED NONE DETECTED   Barbiturates NONE DETECTED NONE DETECTED    Comment: (NOTE) DRUG SCREEN FOR MEDICAL PURPOSES ONLY.  IF CONFIRMATION IS NEEDED FOR ANY  PURPOSE, NOTIFY LAB WITHIN 5 DAYS. LOWEST DETECTABLE LIMITS FOR URINE DRUG SCREEN Drug Class                     Cutoff (ng/mL) Amphetamine and metabolites    1000 Barbiturate and metabolites    200 Benzodiazepine                 762 Tricyclics and metabolites     300 Opiates and metabolites        300 Cocaine and metabolites        300 THC                            50 Performed at [facility], [address] [place], [place] [address]   Urinalysis, Routine w reflex microscopic     Status: Abnormal   Collection Time: 11/25/17  7:33 AM  Result Value Ref Range   Color, Urine YELLOW YELLOW   APPearance CLEAR CLEAR   Specific Gravity, Urine 1.015 1.005 - 1.030   pH 8.0 5.0 - 8.0   Glucose, UA NEGATIVE NEGATIVE mg/dL   Hgb urine dipstick NEGATIVE NEGATIVE   Bilirubin Urine NEGATIVE NEGATIVE   Ketones, ur NEGATIVE NEGATIVE mg/dL   Protein, ur 30 (A) NEGATIVE mg/dL   Nitrite NEGATIVE NEGATIVE   Leukocytes, UA NEGATIVE NEGATIVE    Comment: Performed at [facility], [address] [place], [place] [address]  Urinalysis, Microscopic (reflex)     Status: Abnormal   Collection Time: 11/25/17  7:33  AM  Result Value Ref Range   RBC / HPF 0-5 0 - 5 RBC/hpf   WBC, UA 0-5 0 - 5 WBC/hpf   Bacteria, UA FEW (A) NONE SEEN   Squamous Epithelial / LPF 0-5 0 - 5    Comment: Performed at [facility], [address], [place], [place] [address]   Dg Chest 2 View  Result Date: 11/25/2017 CLINICAL DATA:  Left-sided numbness with cough EXAM: CHEST - 2 VIEW COMPARISON:  November 16, 2017 FINDINGS: There is no evident edema or consolidation. The heart size and pulmonary vascularity are normal. No adenopathy. No bone lesions. No pneumothorax. IMPRESSION: No edema or consolidation. Electronically Signed   By: Lowella Grip III M.D.   On: 11/25/2017 08:15   Ct Head Wo Contrast  Result Date: 11/25/2017 CLINICAL DATA:  Left sided numbness EXAM: CT HEAD WITHOUT  CONTRAST TECHNIQUE: Contiguous axial images were obtained from the base of the skull through the vertex without intravenous contrast. COMPARISON:  None. FINDINGS: Brain: The ventricles are normal in size and configuration. There is a small cavum septum pellucidum, an anatomic variant. There is decreased attenuation in the mid and lateral right occipital lobe regions with localized sulcal effacement in these areas, felt to represent an acute infarct in this area. There is somewhat more subtle decreased attenuation in the mid left occipital lobe, concerning for a smaller acute infarct in this area as well. There is no mass, hemorrhage, extra-axial fluid collection, or midline shift. Elsewhere brain parenchyma appears unremarkable. There is no demonstrable hyperdense vessel. There is calcification in each carotid siphon region. The bony calvarium appears intact. Skull: Bony calvarium appears intact. Sinuses/Orbits: There is an air-fluid level in the right sphenoid sinus. There is opacification of multiple ethmoid air cells bilaterally. There is opacification of the visualized superior left maxillary antrum. Visualized orbits appear symmetric bilaterally. Other: Mastoid air cells are clear. IMPRESSION: 1. Acute appearing infarct involving portions of the right mid and lateral occipital lobes with localized sulcal effacement. 2. Questionable smaller acute infarct in the mid left occipital lobe. 3. No mass or hemorrhage evident. Brain parenchyma elsewhere appears unremarkable. 4.  There is multifocal paranasal sinus disease. 5.  There are foci of arterial vascular calcification. Electronically Signed   By: Lowella Grip III M.D.   On: 11/25/2017 08:04   Mr Brain Wo Contrast  Result Date: 11/25/2017 CLINICAL DATA:  Heaviness and numbness in the left upper extremity since midnight. Frontal headache EXAM: MRI HEAD WITHOUT CONTRAST TECHNIQUE: Multiplanar, multiecho pulse sequences of the brain and surrounding  structures were obtained without intravenous contrast. COMPARISON:  Neck head CT earlier today FINDINGS: Brain: Acute lacunar infarct in the right thalamus. Occipital parietal appearance on previous head CT was artifactual. No hemorrhage, hydrocephalus, or masslike finding. FLAIR hyperintensity in the pons and periventricular white matter, likely mild chronic small vessel ischemia. Partially empty sella, nonspecific in isolation. Vascular: Major flow voids are preserved Skull and upper cervical spine: Negative for marrow lesion Sinuses/Orbits: Extensive mucosal thickening and secretions in the bilateral paranasal sinuses. IMPRESSION: 1. Acute lacunar infarct in the right thalamus. 2. Advanced generalized sinusitis. Electronically Signed   By: Monte Fantasia M.D.   On: 11/25/2017 10:54    Assessment: 62 y.o. female presenting with left face, arm and leg sensory numbness.  1. MRI brain reveals an acute lacunar infarction in the right thalamus.  2. Exam reveals left sided sensory deficits. No weakness appreciated.  3. Stroke Risk Factors - HTN,  ASA noncompliance and prior stroke 4. 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  5. EKG shows NSR  Plan: 1. HgbA1c, fasting lipid panel 2. MRA of the brain without contrast 3. PT consult, OT consult, Speech consult 4. TTE 5. Carotid dopplers 6. Prophylactic therapy- Start ASA 81 mg po qd. Continue simvastatin.  7. Risk factor modification 8. Telemetry monitoring 9. Frequent neuro checks 10. BP management. The patient should be counseled to purchase an automated blood pressure cuff at [facility] or [facility] and begin to keep a blood pressure diary   '@Electronically'  signed: Dr. Kerney Elbe 11/25/2017, 12:12 PM

## 2017-11-25 NOTE — ED Notes (Signed)
Patient was assisted in putting on a gown for MRI.

## 2017-11-25 NOTE — Progress Notes (Signed)
Patient admit to room 31 from the ER with a stroke. Patient alert oriented times four, patient has barrier language. Son is at bedside. Patient in bed locked at low position  Bedside table, telephone and call light at patient reach.

## 2017-11-26 ENCOUNTER — Inpatient Hospital Stay (HOSPITAL_COMMUNITY): Payer: BLUE CROSS/BLUE SHIELD

## 2017-11-26 DIAGNOSIS — I63331 Cerebral infarction due to thrombosis of right posterior cerebral artery: Secondary | ICD-10-CM

## 2017-11-26 DIAGNOSIS — I6789 Other cerebrovascular disease: Secondary | ICD-10-CM

## 2017-11-26 LAB — CBC
HCT: 37.1 % (ref 36.0–46.0)
Hemoglobin: 11.4 g/dL — ABNORMAL LOW (ref 12.0–15.0)
MCH: 27.2 pg (ref 26.0–34.0)
MCHC: 30.7 g/dL (ref 30.0–36.0)
MCV: 88.5 fL (ref 80.0–100.0)
Platelets: 206 10*3/uL (ref 150–400)
RBC: 4.19 MIL/uL (ref 3.87–5.11)
RDW: 13.1 % (ref 11.5–15.5)
WBC: 5.4 10*3/uL (ref 4.0–10.5)
nRBC: 0 % (ref 0.0–0.2)

## 2017-11-26 LAB — HIV 1/2 AB DIFFERENTIATION
HIV 1 Ab: POSITIVE — AB
HIV 2 Ab: NEGATIVE

## 2017-11-26 LAB — BASIC METABOLIC PANEL
Anion gap: 8 (ref 5–15)
BUN: 7 mg/dL — ABNORMAL LOW (ref 8–23)
CO2: 22 mmol/L (ref 22–32)
Calcium: 8.6 mg/dL — ABNORMAL LOW (ref 8.9–10.3)
Chloride: 104 mmol/L (ref 98–111)
Creatinine, Ser: 0.81 mg/dL (ref 0.44–1.00)
GFR calc Af Amer: >60 mL/min (ref >60)
GFR calc non Af Amer: >60 mL/min (ref >60)
Glucose, Bld: 90 mg/dL (ref 70–99)
Potassium: 3.9 mmol/L (ref 3.5–5.1)
Sodium: 134 mmol/L — ABNORMAL LOW (ref 135–145)

## 2017-11-26 LAB — HIV ANTIBODY (ROUTINE TESTING W REFLEX): HIV Screen 4th Generation wRfx: REACTIVE — AB

## 2017-11-26 LAB — HEMOGLOBIN A1C
Hgb A1c MFr Bld: 5.7 % — ABNORMAL HIGH (ref 4.8–5.6)
Mean Plasma Glucose: 116.89 mg/dL

## 2017-11-26 LAB — LIPID PANEL
Cholesterol: 123 mg/dL (ref 0–200)
HDL: 31 mg/dL — ABNORMAL LOW (ref >40)
LDL Cholesterol: 77 mg/dL (ref 0–99)
Total CHOL/HDL Ratio: 4 RATIO
Triglycerides: 75 mg/dL (ref <150)
VLDL: 15 mg/dL (ref 0–40)

## 2017-11-26 MED ORDER — IOPAMIDOL (ISOVUE-370) INJECTION 76%
INTRAVENOUS | Status: AC
  Filled 2017-11-26: qty 50

## 2017-11-26 MED ORDER — CLOPIDOGREL BISULFATE 75 MG PO TABS
75.0000 mg | ORAL_TABLET | Freq: Every day | ORAL | Status: DC
  Administered 2017-11-26 – 2017-11-28 (×3): 75 mg via ORAL
  Filled 2017-11-26 (×3): qty 1

## 2017-11-26 MED ORDER — IOPAMIDOL (ISOVUE-370) INJECTION 76%
50.0000 mL | Freq: Once | INTRAVENOUS | Status: AC | PRN
  Administered 2017-11-26: 50 mL via INTRAVENOUS

## 2017-11-26 MED ORDER — ALBUTEROL SULFATE (2.5 MG/3ML) 0.083% IN NEBU
2.5000 mg | INHALATION_SOLUTION | Freq: Four times a day (QID) | RESPIRATORY_TRACT | Status: DC | PRN
  Administered 2017-11-26 – 2017-11-27 (×2): 2.5 mg via RESPIRATORY_TRACT
  Filled 2017-11-26 (×2): qty 3

## 2017-11-26 NOTE — Progress Notes (Signed)
[facility] PROGRESS NOTE  Sandra [name] UYQ:[id number] DOB: 1955-10-13 DOA: 11/25/2017 PCP: [facility] [place]   Narrative: 62 year old female originally from [place] HTN, [place], stroke right internal capsule 2016 with no residual deficit Presented with numbness of the left side of body on 11/20 also has had URI symptoms for 2 weeks prior to this-initial work-up blood pressure slightly elevated 169/90 prior exam Work-up showed acute infarct right middle lateral with small infarct left occipital and multifocal paranasal sinuses MRI showed right thalamic stroke corresponding to left sided numbness Neurology consulted    A & Plan Right thalamic stroke-MR shows stroke-CTA head pending Echo read pending-rest as per neuro Sinusitis-continue augmentin-suspect post-nasal drip HTN-permissive control only--would not aggressively control at this time HLD-change to crestor 40 daily for 2/2 prevention Mild malnutrition       lovenox full code inpatient d/w family   Verlon Au, MD  [facility] Direct contact: [phone] --Via amion app OR  --[email or web address]; password [email or web address]  7PM-7AM contact night coverage as above 11/26/2017, 7:59 AM  LOS: 1 day   Consultants:  neuro  Procedures:   multple  Antimicrobials:   n  Interval history/Subjective: Awake alert deficts resolved voughed ovenright-bno wheeze currently no cp  Objective:  Vitals:  Vitals:   11/26/17 0135 11/26/17 0335  BP: (!) 141/85 (!) 147/82  Pulse: 69 75  Resp: 18 18  Temp: 98.7 F (37.1 C) 98.5 F (36.9 C)  SpO2: 99% 98%    Exam:  Awake pleasant no wheeze no ict pallor cta b no wheeze abd soft nt nd no rebound Power 5/5 bilat abd soft nt nd no reboudn no guard-no past pointing  I have personally reviewed the following:   Labs:  aic 5.7  ldl 77-total chol 123  Imaging studies:  Ct  Echo pending   CTa pend  Medical tests:     Test discussed with  performing physician:    Decision to obtain old records:    Review and summation of old records:    Scheduled Meds: . amoxicillin-clavulanate  1 tablet Oral Q12H  . aspirin EC  81 mg Oral Daily  . clopidogrel  75 mg Oral Daily  . enoxaparin (LOVENOX) injection  40 mg Subcutaneous Q24H  . simvastatin  40 mg Oral q1800   Continuous Infusions: . 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

## 2017-11-26 NOTE — Progress Notes (Signed)
  Echocardiogram 2D Echocardiogram has been performed.  Sandra [name] G Sandra [name] 11/26/2017, 11:18 AM

## 2017-11-26 NOTE — Evaluation (Signed)
Speech Language Pathology Evaluation Patient Details Name: Sandra [name] MRN: [id number] DOB: 01/28/1955 Today's Date: 11/26/2017 Time: 4098-1191 SLP Time Calculation (min) (ACUTE ONLY): 14 min  Problem List:  Patient Active Problem List   Diagnosis Date Noted  . CVA (cerebral vascular accident) ([place]) 11/25/2017  . Otitis media 11/25/2017  . Bronchitis 11/25/2017  . Cerebral infarction ([place]) 02/08/2015  . Stroke with cerebral ischemia ([place])   . Stroke ([place]) 12/08/2014  . Left-sided weakness   . HLD (hyperlipidemia)   . Benign essential HTN   . 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 Arthritis   . Headache   . Hypertension   . Stroke [facility])    Past Surgical History:  Past Surgical History:  Procedure Laterality Date  . EYE SURGERY     HPI:  Pt is a 62 y.o. female admitted 11/25/17 with c/o L-sided weakness and numbness. MRI showed acute lacunar infarct in R thalamus. Pt also with URI symptoms. Pt outside of tPA window. PMH includes HTN, HLD, prior stroke (2016), arthritis.   Assessment / Plan / Recommendation Clinical Impression   Pt presents with grossly intact cognitive-linguistic function.  Her speech is fluent and free from dysarthria or word finding impairment.  Her family reports no cognitive changes and pt demonstrated appropriate verbal reasoning and problem solving during tasks.  As a result, no further ST needs are indicated at this time.      SLP Assessment  SLP Recommendation/Assessment: Patient does not need any further Speech Lanaguage Pathology Services    Follow Up Recommendations  None          SLP Evaluation Cognition  Overall Cognitive Status: Within Functional Limits for tasks assessed       Comprehension  Auditory Comprehension Overall Auditory Comprehension: Appears within functional limits for tasks assessed    Expression  Expression Primary Mode of Expression: Verbal Verbal Expression Overall Verbal Expression: Appears within functional limits for tasks assessed   Oral / Motor  Oral Motor/Sensory Function Overall Oral Motor/Sensory Function: Within functional limits Motor Speech Overall Motor Speech: Appears within functional limits for tasks assessed   GO                    Emilio Math 11/26/2017, 3:58 PM

## 2017-11-26 NOTE — Progress Notes (Signed)
Pt c/o of headache and increase cough. On assessment, pt has expiratory wheezing on the left lower lobe. MD notified.

## 2017-11-26 NOTE — Progress Notes (Addendum)
STROKE TEAM PROGRESS NOTE   INTERVAL HISTORY Her  Son is at the bedside. Who translates. Patient's primary language is [place].  Patient in bed awake, alert, NAD.  Vitals:   11/26/17 0135 11/26/17 0335 11/26/17 0833 11/26/17 1218  BP: (!) 141/85 (!) 147/82 (!) 151/76 132/79  Pulse: 69 75 65 69  Resp: 18 18 15    Temp: 98.7 F (37.1 C) 98.5 F (36.9 C) 98.9 F (37.2 C) 98.6 F (37 C)  TempSrc: Oral Oral Oral Oral  SpO2: 99% 98% 99% 99%  Weight:      Height:        CBC:  Recent Labs  Lab 11/25/17 0733 11/26/17 0354  WBC 7.3 5.4  NEUTROABS 3.8  --   HGB 13.1 11.4*  HCT 41.1 37.1  MCV 88.4 88.5  PLT 226 132    Basic Metabolic Panel:  Recent Labs  Lab 11/25/17 0733 11/26/17 0354  NA 133* 134*  K 3.8 3.9  CL 97* 104  CO2 21* 22  GLUCOSE 110* 90  BUN 10 7*  CREATININE 0.77 0.81  CALCIUM 9.3 8.6*   Lipid Panel:     Component Value Date/Time   CHOL 123 11/26/2017 0354   TRIG 75 11/26/2017 0354   HDL 31 (L) 11/26/2017 0354   CHOLHDL 4.0 11/26/2017 0354   VLDL 15 11/26/2017 0354   LDLCALC 77 11/26/2017 0354   HgbA1c:  Lab Results  Component Value Date   HGBA1C 5.7 (H) 11/26/2017   Urine Drug Screen:     Component Value Date/Time   LABOPIA NONE DETECTED 11/25/2017 0733   COCAINSCRNUR NONE DETECTED 11/25/2017 0733   LABBENZ NONE DETECTED 11/25/2017 0733   AMPHETMU NONE DETECTED 11/25/2017 0733   THCU NONE DETECTED 11/25/2017 0733   LABBARB NONE DETECTED 11/25/2017 0733    Alcohol Level     Component Value Date/Time   ETH <10 11/25/2017 0733    IMAGING Dg Chest 2 View  Result Date: 11/25/2017 CLINICAL DATA:  Left-sided numbness with cough EXAM: CHEST - 2 VIEW COMPARISON:  November 16, 2017 FINDINGS: There is no evident edema or consolidation. The heart size and pulmonary vascularity are normal. No adenopathy. No bone lesions. No pneumothorax. IMPRESSION: No edema or consolidation. Electronically Signed   By: Lowella Grip III M.D.   On:  11/25/2017 08:15   Ct Head Wo Contrast  Result Date: 11/25/2017 CLINICAL DATA:  Left sided numbness EXAM: CT HEAD WITHOUT CONTRAST TECHNIQUE: Contiguous axial images were obtained from the base of the skull through the vertex without intravenous contrast. COMPARISON:  None. FINDINGS: Brain: The ventricles are normal in size and configuration. There is a small cavum septum pellucidum, an anatomic variant. There is decreased attenuation in the mid and lateral right occipital lobe regions with localized sulcal effacement in these areas, felt to represent an acute infarct in this area. There is somewhat more subtle decreased attenuation in the mid left occipital lobe, concerning for a smaller acute infarct in this area as well. There is no mass, hemorrhage, extra-axial fluid collection, or midline shift. Elsewhere brain parenchyma appears unremarkable. There is no demonstrable hyperdense vessel. There is calcification in each carotid siphon region. The bony calvarium appears intact. Skull: Bony calvarium appears intact. Sinuses/Orbits: There is an air-fluid level in the right sphenoid sinus. There is opacification of multiple ethmoid air cells bilaterally. There is opacification of the visualized superior left maxillary antrum. Visualized orbits appear symmetric bilaterally. Other: Mastoid air cells are clear. IMPRESSION: 1. Acute appearing infarct  involving portions of the right mid and lateral occipital lobes with localized sulcal effacement. 2. Questionable smaller acute infarct in the mid left occipital lobe. 3. No mass or hemorrhage evident. Brain parenchyma elsewhere appears unremarkable. 4.  There is multifocal paranasal sinus disease. 5.  There are foci of arterial vascular calcification. Electronically Signed   By: Lowella Grip III M.D.   On: 11/25/2017 08:04   Mr Brain Wo Contrast  Result Date: 11/25/2017 CLINICAL DATA:  Heaviness and numbness in the left upper extremity since midnight. Frontal  headache EXAM: MRI HEAD WITHOUT CONTRAST TECHNIQUE: Multiplanar, multiecho pulse sequences of the brain and surrounding structures were obtained without intravenous contrast. COMPARISON:  Neck head CT earlier today FINDINGS: Brain: Acute lacunar infarct in the right thalamus. Occipital parietal appearance on previous head CT was artifactual. No hemorrhage, hydrocephalus, or masslike finding. FLAIR hyperintensity in the pons and periventricular white matter, likely mild chronic small vessel ischemia. Partially empty sella, nonspecific in isolation. Vascular: Major flow voids are preserved Skull and upper cervical spine: Negative for marrow lesion Sinuses/Orbits: Extensive mucosal thickening and secretions in the bilateral paranasal sinuses. IMPRESSION: 1. Acute lacunar infarct in the right thalamus. 2. Advanced generalized sinusitis. Electronically Signed   By: Monte Fantasia M.D.   On: 11/25/2017 10:54    PHYSICAL EXAM  HEENT-  Normocephalic, no lesions, without obvious abnormality.  Normal external eye and conjunctiva.  Cardiovascular- S1-S2 audible, pulses palpable throughout   Lungs-no rhonchi or wheezing noted, no excessive working breathing.  Saturations within normal limits on RA Extremities- Warm, dry and intact Musculoskeletal-no joint tenderness, deformity or swelling Skin-warm and dry, no hyperpigmentation, vitiligo, or suspicious lesions  Neurologic Examination: Mental Status: Alert, fully oriented to name/age/month/year, thought content appropriate.  Speech is primarily in the language of her home country, which is [place]. With son interpreting, her speech is fluent with intact comprehension and naming.  Able to follow all commands without difficulty. Cranial Nerves: II:  Visual fields intact. No extinction to DSS. PERRL.   III,IV, VI: EOMI without nystagmus. No ptosis.  V,VII: No facial droop.facial light touch sensation decreased on the left. VIII: hearing intact to voice IX,X: Mild  hoarseness and hypophonia XI: Symmetric XII: midline tongue extension  Motor: Right :  Upper extremity   5/5                                      Left:     Upper extremity   5/5             Lower extremity   5/5                                                  Lower extremity   5/5 Normal tone throughout; no atrophy noted Sensory: Decreased light touch to BLE.  Plantars: Right: downgoing                           Left: downgoing Cerebellar: No ataxia with FNF bilaterally Gait: normal gait and station  ASSESSMENT/PLAN Ms. Hubert Derstine is a 62 y.o. female with history of  Stroke, HTN, HA presenting with  left face, arm and leg sensory numbness.   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  CT head Acute appearing infarct involving portions of the right mid and  lateral occipital lobes with localized sulcal effacement  CTA head & neck pending  MRI  Acute lacunar infarct in the right thalamus  2D Echo  Pending  LDL 77  HgbA1c 5.7  Lovenox for VTE prophylaxis  aspirin 81 mg daily prior to admission, now on aspirin 81 mg daily and clopidogrel 75 mg daily.   Therapy recommendations: no OT f/u, PT : pending  Disposition:  pending  Hypertension  Stable . Permissive hypertension (OK if < 220/120) but gradually normalize in 5-7 days . Long-term BP goal normotensive  Hyperlipidemia  Home meds:  simvastatin 40,  Simvastatin 40 resumed in hospital  LDL 77, goal < 70  Continue statin at discharge  Diabetes type II  HgbA1c 5.7, goal < 7.0  Controlled  Other Stroke Risk Factors  ETOH use, advised to drink no more than 2 drink a day  Hx stroke    Hospital day # 1  Laurey Morale, MSN, NP-C [facility] [phone]  ATTENDING NOTE: I reviewed above note and agree with the  assessment and plan. Pt was seen and examined.   63 year old female with history of hypertension, headache, stroke in 12/2014 admitted for headache, left-sided numbness and heaviness.  Patient had a stroke in 12/2014 with left-sided weakness.  MRI showed right posterior limb internal capsule small infarct.  MRA showed mild right M1 stenosis.  Carotid Doppler and 2D echo negative.  LDL 170 and A1c 6.1.  She was put on aspirin and Zocor on discharge.  She follow with Dr. Leonie Man at [facility], continued aspirin and Zocor, and she had no neurologic deficit after PT/OT.  This time on admission MRI showed right thalamic small infarct.  CTA head neck showed moderate right M1 origin stenosis and right V4 stenosis, as well as right ICA web like structure causing 50% stenosis.  However, this seems asymptomatic at this time as her stroke likely within the right PCA territory.  EF 60 to 65%.  LDL 77 A1c 5.7 UDS negative.  On examination, patient awake alert, orientated.  Visual field to fall, no gaze palsy, facial symmetrical tongue midline.  Muscle strength intact in all 4 extremities.  However, left facial and upper and lower extremities decreased sensation, about 50% of the right.  She also had mild dysmetria on the left upper extremity finger-to-nose testing.  Patient stroke still consistent with small vessel disease.  Recommend to continue aspirin 81 and Plavix 75 DAPT for 3 weeks and then Plavix alone.  Continue Zocor.  PT/OT no recommendations.  Neurology will sign off. Please call with questions. Pt will follow up with stroke clinic NP at [facility] in about 4 weeks. Thanks for the consult.   Rosalin Hawking, MD PhD Stroke Neurology 11/26/2017 5:03 PM     To contact Stroke Continuity provider, please refer to [email or web address] After hours, contact General Neurology

## 2017-11-26 NOTE — Progress Notes (Signed)
SLP Cancellation Note  Patient Details Name: Sandra [name] MRN: [id number] DOB: [birth year]   Cancelled treatment:       Reason Eval/Treat Not Completed: Patient at procedure or test/unavailable.  Will follow up as pt is available  Emilio Math 11/26/2017, 10:54 AM

## 2017-11-26 NOTE — Evaluation (Signed)
Occupational Therapy Evaluation Patient Details Name: Sandra [name] MRN: [id number] DOB: August 30, 1955 Today's Date: 11/26/2017    History of Present Illness Pt is a 62 y.o. female admitted 11/25/17 with c/o L-sided weakness and numbness. MRI showed acute lacunar infarct in R thalamus. Pt also with URI symptoms. Pt outside of tPA window. PMH includes HTN, HLD, prior stroke (2016), arthritis.   Clinical Impression   Pt admitted as above currently demonstrating overall Mod I with ADL and functional transfers related to ADL's. LUE w/ some residual numbness per pt report however she is intact to light touch L hand/UE digits 1-5. MMT L UE reveals overall 4/5 vs R = 4+/5. Pt was issued HEP Left using putty for grip, pinches and opposition ex's 1-2x/day x5-10 reps. She is otherwise at or near baseline level and denies further acute OT needs at this time. Will sign off.    Follow Up Recommendations  No OT follow up    Equipment Recommendations  None recommended by OT;Other (comment)(Issued yellow theraputty for LUE/hand strengthening ex's 1-2x/day)    Recommendations for Other Services       Precautions / Restrictions Precautions Precautions: None Restrictions Weight Bearing Restrictions: No      Mobility Bed Mobility Overal bed mobility: Independent                Transfers Overall transfer level: Independent                    Balance Overall balance assessment: Needs assistance   Sitting balance-Leahy Scale: Normal       Standing balance-Leahy Scale: Good               High level balance activites: Side stepping;Backward walking;Direction changes;Turns;Head turns High Level Balance Comments: No overt instability or LOB noted with higher level balance activities           ADL either performed or assessed with clinical judgement   ADL Overall ADL's : Needs assistance/impaired;Modified independent Eating/Feeding: Independent;Sitting   Grooming:  Wash/dry hands;Wash/dry face;Standing;Modified independent   Upper Body Bathing: Modified independent;Standing   Lower Body Bathing: Modified independent;Sit to/from stand   Upper Body Dressing : Independent;Sitting;Standing   Lower Body Dressing: Independent;Modified independent;Sit to/from stand;Sitting/lateral leans Lower Body Dressing Details (indicate cue type and reason): Pt independently donned and doffed slipper socks and managed hospital gowns (front and back) w/o difficulty during ADL's. Toilet Transfer: Modified Independent;Ambulation;Regular Toilet   Toileting- Clothing Manipulation and Hygiene: Modified independent;Sit to/from stand;Sitting/lateral lean       Functional mobility during ADLs: Modified independent General ADL Comments: Pt was assessed by OT and participated in ADL retraining session for grooming, LB ADL's, transfers and was also educated in HEP for Left hand that included theraputty (Soft/yellow) for grip, 3-point and lateral pinches as well as opposition ex's. Ex's were issued both written and verbally and pt and grandaughter were educated. Both verbalized instructions for performance 1-2x/day x5-10 reps each as well as care of putty. No further needs are identified at this time, will sign off.     Vision Patient Visual Report: No change from baseline       Perception     Praxis      Pertinent Vitals/Pain Pain Assessment: Faces Faces Pain Scale: Hurts a little bit Pain Location: Headache Pain Descriptors / Indicators: Headache Pain Intervention(s): Limited activity within patient's tolerance;Monitored during session;Repositioned     Hand Dominance Right   Extremity/Trunk Assessment Upper Extremity Assessment Upper Extremity Assessment: Overall WFL for  tasks assessed;Generalized weakness;LUE deficits/detail LUE Deficits / Details: c/o numbness/weakness however MMT R vs L was grossly 4/5 Left and 4+/5 Right LUE Sensation: (WFL's intact to light  touch digits 1-5 noted) LUE Coordination: (WFL's )   Lower Extremity Assessment Lower Extremity Assessment: Defer to PT evaluation LLE Deficits / Details: Grossly 4/5 strength throughout; pt reports numbness throughout LLE; decreased light touch noted in anterior thigh LLE Sensation: decreased light touch   Cervical / Trunk Assessment Cervical / Trunk Assessment: Normal   Communication Communication Communication: Prefers language other than English(Able to communicate with basic English)   Cognition Arousal/Alertness: Awake/alert Behavior During Therapy: WFL for tasks assessed/performed Overall Cognitive Status: Within Functional Limits for tasks assessed                                 General Comments: For simple tasks   General Comments       Exercises Other Exercises Other Exercises: Putty ex's x5-10 reps each Left hand for grip, lateral and 3 point pinch as well as opposition 1-2x/day. Stressed not to over exercise, pt/family verbalized understanding.   Shoulder Instructions      Home Living Family/patient expects to be discharged to:: Private residence Living Arrangements: Children Available Help at Discharge: Family;Available PRN/intermittently Type of Home: House Home Access: Stairs to enter [employer] of Steps: 2-3 Entrance Stairs-Rails: Right Home Layout: One level     Bathroom Shower/Tub: Teacher, early years/pre: Standard     Home Equipment: None   Additional Comments: Lives with granddaughter; multiple family members nearby      Prior Functioning/Environment Level of Independence: Independent        Comments: Does not drive. Indep with mobility and household tasks        OT Problem List:        OT Treatment/Interventions:      OT Goals(Current goals can be found in the care plan section) Acute Rehab OT Goals Patient Stated Goal: Get well.  OT Goal Formulation: All assessment and education complete, DC  therapy Time For Goal Achievement: 11/26/17  OT Frequency:     Barriers to D/C:            Co-evaluation              AM-PAC PT "6 Clicks" Daily Activity     Outcome Measure Help from another person eating meals?: None Help from another person taking care of personal grooming?: None Help from another person toileting, which includes using toliet, bedpan, or urinal?: None Help from another person bathing (including washing, rinsing, drying)?: None(Bathing standing and sitting at sink. Tub transfer not formally assessed.) Help from another person to put on and taking off regular upper body clothing?: None Help from another person to put on and taking off regular lower body clothing?: None 6 Click Score: 24   End of Session Equipment Utilized During Treatment: Gait belt  Activity Tolerance: Patient tolerated treatment well Patient left: in chair;with call bell/phone within reach;with family/visitor present                   Time: 0811-0828 OT Time Calculation (min): 17 min Charges:  OT General Charges $OT Visit: 1 Visit OT Evaluation $OT Eval Low Complexity: 1 Low OT Treatments $Therapeutic Activity: 8-22 mins   Cherilyn Sautter Beth Dixon, OTR/L 11/26/2017, 9:57 AM

## 2017-11-26 NOTE — Progress Notes (Signed)
Pt reports improved breathing. Pt has decreased to almost inaudible LLQ wheezing on auscultation after albuterol nebulizer treatment.

## 2017-11-26 NOTE — Evaluation (Signed)
Physical Therapy Evaluation Patient Details Name: Sandra [name] MRN: [id number] DOB: [birth year] Today's Date: 11/26/2017   History of Present Illness  Pt is a 62 y.o. female admitted 11/25/17 with c/o L-sided weakness and numbness. MRI showed acute lacunar infarct in R thalamus. Pt also with URI symptoms. Pt outside of tPA window. PMH includes HTN, HLD, prior stroke (2016), arthritis.    Clinical Impression  Patient evaluated by Physical Therapy with no further acute PT needs identified. PTA, pt indep and lives with family. Today, pt indep with mobility and higher level balance tasks. Reports residual numbness in LUE/LLE; LLE strength grossly 4/5 with decreased light touch noted in anterior L thigh. All education has been completed and the patient has no further questions. Acute PT is signing off. Thank you for this referral.    Follow Up Recommendations No PT follow up    Equipment Recommendations  None recommended by PT    Recommendations for Other Services       Precautions / Restrictions Precautions Precautions: None Restrictions Weight Bearing Restrictions: No      Mobility  Bed Mobility Overal bed mobility: Independent                Transfers Overall transfer level: Independent                  Ambulation/Gait Ambulation/Gait assistance: Independent Gait Distance (Feet): 400 Feet Assistive device: None Gait Pattern/deviations: Step-through pattern;Decreased stride length   Gait velocity interpretation: >2.62 ft/sec, indicative of community ambulatory General Gait Details: Slow, steady ambulation. Able to increase and decrease gait speed with cues; no instability noted with velocity changes, head turns or direction changes  Stairs            Wheelchair Mobility    Modified Rankin (Stroke Patients Only) Modified Rankin (Stroke Patients Only) Pre-Morbid Rankin Score: No symptoms Modified Rankin: No significant disability     Balance  Overall balance assessment: Needs assistance   Sitting balance-Leahy Scale: Normal       Standing balance-Leahy Scale: Good               High level balance activites: Side stepping;Backward walking;Direction changes;Turns;Head turns High Level Balance Comments: No overt instability or LOB noted with higher level balance activities             Pertinent Vitals/Pain Pain Assessment: Faces Faces Pain Scale: Hurts a little bit Pain Location: Headache Pain Descriptors / Indicators: Headache Pain Intervention(s): Monitored during session    Home Living Family/patient expects to be discharged to:: Private residence Living Arrangements: Children Available Help at Discharge: Family;Available PRN/intermittently Type of Home: House Home Access: Stairs to enter Entrance Stairs-Rails: Right Entrance Stairs-Number of Steps: 2-3 Home Layout: One level Home Equipment: None Additional Comments: Lives with granddaughter; multiple family members nearby    Prior Function Level of Independence: Independent         Comments: Does not drive. Indep with mobility and household tasks     Hand Dominance   Dominant Hand: Right    Extremity/Trunk Assessment   Upper Extremity Assessment Upper Extremity Assessment: LUE deficits/detail LUE Deficits / Details: c/o numbness/weakness    Lower Extremity Assessment Lower Extremity Assessment: LLE deficits/detail LLE Deficits / Details: Grossly 4/5 strength throughout; pt reports numbness throughout LLE; decreased light touch noted in anterior thigh LLE Sensation: decreased light touch    Cervical / Trunk Assessment Cervical / Trunk Assessment: Normal  Communication   Communication: Prefers language other than English(able to communicate  with basic [place])  Cognition Arousal/Alertness: Awake/alert Behavior During Therapy: WFL for tasks assessed/performed Overall Cognitive Status: Within Functional Limits for tasks assessed                                  General Comments: For simple tasks      General Comments      Exercises     Assessment/Plan    PT Assessment Patent does not need any further PT services  PT Problem List         PT Treatment Interventions      PT Goals (Current goals can be found in the Care Plan section)  Acute Rehab PT Goals PT Goal Formulation: All assessment and education complete, DC therapy    Frequency     Barriers to discharge        Co-evaluation               AM-PAC PT "6 Clicks" Daily Activity  Outcome Measure Difficulty turning over in bed (including adjusting bedclothes, sheets and blankets)?: None Difficulty moving from lying on back to sitting on the side of the bed? : None Difficulty sitting down on and standing up from a chair with arms (e.g., wheelchair, bedside commode, etc,.)?: None Help needed moving to and from a bed to chair (including a wheelchair)?: None Help needed walking in hospital room?: None Help needed climbing 3-5 steps with a railing? : None 6 Click Score: 24    End of Session Equipment Utilized During Treatment: Gait belt Activity Tolerance: Patient tolerated treatment well Patient left: in chair(with OT) Nurse Communication: Mobility status PT Visit Diagnosis: Other abnormalities of gait and mobility (R26.89)    Time: 7062-3762 PT Time Calculation (min) (ACUTE ONLY): 10 min   Charges:   PT Evaluation $PT Eval Low Complexity: [place], PT, DPT Acute Rehabilitation Services  Pager [phone] Office [phone]  Derry Lory 11/26/2017, 8:25 AM

## 2017-11-27 DIAGNOSIS — B20 Human immunodeficiency virus [HIV] disease: Secondary | ICD-10-CM

## 2017-11-27 DIAGNOSIS — I6350 Cerebral infarction due to unspecified occlusion or stenosis of unspecified cerebral artery: Principal | ICD-10-CM

## 2017-11-27 MED ORDER — BICTEGRAVIR-EMTRICITAB-TENOFOV 50-200-25 MG PO TABS: 1.0000 | ORAL_TABLET | Freq: Every day | ORAL | 2 refills | Status: DC

## 2017-11-27 MED ORDER — BICTEGRAVIR-EMTRICITAB-TENOFOV 50-200-25 MG PO TABS
1.0000 | ORAL_TABLET | Freq: Every day | ORAL | Status: DC
  Administered 2017-11-27 – 2017-11-28 (×2): 1 via ORAL
  Filled 2017-11-27 (×2): qty 1

## 2017-11-27 MED ORDER — TRAZODONE HCL 50 MG PO TABS
50.0000 mg | ORAL_TABLET | Freq: Once | ORAL | Status: AC
  Administered 2017-11-27: 50 mg via ORAL
  Filled 2017-11-27: qty 1

## 2017-11-27 MED ORDER — BENZONATATE 100 MG PO CAPS
200.0000 mg | ORAL_CAPSULE | Freq: Three times a day (TID) | ORAL | Status: DC | PRN
  Administered 2017-11-27: 200 mg via ORAL
  Filled 2017-11-27: qty 2

## 2017-11-27 MED FILL — BIKTARVY 50-200-25 MG TABS: 50-200-25 | 30 days supply | Qty: 30 | Fill #0

## 2017-11-27 NOTE — Progress Notes (Signed)
Pt continued to have the dry cough and she requested for something for it; pt didn't have any prn order; NP on-call notified and new orders received including something for sleep as pt requested for something to help her sleep. RT notified of pt breathing treatment request. Delia Heady RN

## 2017-11-27 NOTE — Consult Note (Signed)
[facility]    Date of Admission:  11/25/2017     Total days of antibiotics                Reason for Consult: New HIV  Referring Provider: [employer] Primary Care Provider: [facility]   Assessment/Plan:  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 Through history she was apparently diagnosed with HIV prior to leaving [place] but was in denial of her results. She has not received treatment to date. Not currently sexually active and may have obtained the virus from her deceased husband.  She has no current signs/symptoms of opportunistic infection. We have spoken with her regarding her diagnosis and the treatments options available through the use of a medical interpreter. Will obtain HIV blood work and get her established at [facility] at discharge. Start ART therapy with [facility] which will be delivered bedside. Currently she wishes only one of her daughters to know.   1. HIV initial blood work drawn and pending.  2. Start Biktarvy.  3. Establish with ID clinic at discharge.    Principal Problem:   Cerebrovascular accident (CVA) due to occlusion of cerebral artery (HCC) Active Problems:   HLD (hyperlipidemia)   Benign essential HTN   CVA (cerebral vascular accident) ([place])   Otitis media   Bronchitis   . amoxicillin-clavulanate  1 tablet Oral Q12H  . aspirin EC  81 mg Oral Daily  . clopidogrel  75 mg Oral Daily  . enoxaparin (LOVENOX) injection  40 mg Subcutaneous Q24H  . simvastatin  40 mg Oral q1800     HPI: Sandra [name] is a 62 y.o. female with medical history as listed below and significant for hypertension and stroke who was admitted for 2 days of cough, headaches and shortness of breath with increased heaviness and numbness of her  left upper and lower extremities.   Sandra [name] had a temperature of 99.2 and was hypertensive while in the ED. Sensation changes appeared to be subjective as she had good strength in all extremities tested. Diagnosed with right otitis media and was transferred to [facility] for MRI. She was started on Augmentin prior to transfer.  MRI of the brain showed an acute lacunar infarct in the right thalamus with advanced generalized sinusitis. CT angiogram with shelf-like filling defect of the right ICA resulting in 50% occlusion and moderate stenosis of right M1 origin.   Sandra [name] was found to have a positive 4th generation HIV antibody test which was confirmed positive with HIV-1. She is originally from [place] about 5 years ago and is not currently sexually active since her husband died over 5 years ago. She is currently living with her daughter. Believes she may have been diagnosed in [place] but was in denial. She has had no treatment to date. Denies fevers, chills, night sweats, headaches, changes in vision, neck pain/stiffness, nausea, diarrhea, vomiting, lesions or rashes.   Review of Systems: Review of Systems  Constitutional: Negative for chills, fever, malaise/fatigue and weight loss.  Eyes: Negative for blurred vision and double vision.  Respiratory: Positive for cough. Negative for sputum production, shortness of breath and wheezing.   Cardiovascular: Negative for chest pain and leg swelling.  Gastrointestinal: Negative for abdominal pain, blood in stool, constipation, diarrhea,  nausea and vomiting.  Genitourinary: Negative for frequency and urgency.  Skin: Negative for rash.  Neurological: Negative for weakness and headaches.     Past Medical History:  Diagnosis Date  . Arthritis   . Headache   . Hypertension   . Stroke [facility])     Social History   Tobacco Use  . Smoking status: Never Smoker  . Smokeless tobacco: Never Used  Substance Use Topics  . Alcohol use: Never     Frequency: Never  . Drug use: Never    History reviewed. No pertinent family history.  No Known Allergies  OBJECTIVE: Blood pressure (!) 152/80, pulse 67, temperature 98.3 F (36.8 C), temperature source Oral, resp. rate 15, height 5' (1.524 m), weight 55 kg, SpO2 96 %.  Physical Exam  Constitutional: She is oriented to person, place, and time. She appears well-developed and well-nourished. No distress.  Pleasant; seated on the side of the bed. Tearful at times.   HENT:  Mouth/Throat: Oropharynx is clear and moist.  Poor dentition noted.   Eyes: Conjunctivae are normal.  Neck: Neck supple.  Cardiovascular: Normal rate, regular rhythm, normal heart sounds and intact distal pulses. Exam reveals no gallop and no friction rub.  No murmur heard. Pulmonary/Chest: Effort normal and breath sounds normal. No respiratory distress. She has no wheezes. She has no rales. She exhibits no tenderness.  Abdominal: Soft. Bowel sounds are normal. There is no tenderness.  Lymphadenopathy:    She has no cervical adenopathy.  Neurological: She is alert and oriented to person, place, and time.  Skin: Skin is warm and dry. No rash noted.  Psychiatric: She has a normal mood and affect.    Lab Results Lab Results  Component Value Date   WBC 5.4 11/26/2017   HGB 11.4 (L) 11/26/2017   HCT 37.1 11/26/2017   MCV 88.5 11/26/2017   PLT 206 11/26/2017    Lab Results  Component Value Date   CREATININE 0.81 11/26/2017   BUN 7 (L) 11/26/2017   NA 134 (L) 11/26/2017   K 3.9 11/26/2017   CL 104 11/26/2017   CO2 22 11/26/2017    Lab Results  Component Value Date   ALT 33 11/25/2017   AST 32 11/25/2017   ALKPHOS 56 11/25/2017   BILITOT 1.0 11/25/2017     Microbiology: No results found for this or any previous visit (from the past 240 hour(s)).   Terri Piedra, NP [facility] [phone] Pager  11/27/2017  10:48 AM

## 2017-11-27 NOTE — Progress Notes (Signed)
[facility] PROGRESS NOTE  Sandra [name] IOE:[id number] DOB: November 01, 1955 DOA: 11/25/2017 PCP: [facility] [place]   Narrative: 62 year old female originally from [place] HTN, [place], stroke right internal capsule 2016 with no residual deficit Presented with numbness of the left side of body on 11/20 also has had URI symptoms for 2 weeks prior to this-initial work-up blood pressure slightly elevated 169/90 prior exam Work-up showed acute infarct right middle lateral with small infarct left occipital and multifocal paranasal sinuses MRI showed right thalamic stroke corresponding to left sided numbness Neurology consulted    A & Plan Right thalamic stroke-MR shows stroke-work-up done-cont asa and plavix-then plav alone after  3 weeks HIV-Id involved long discussion with them today-needs to start HAART-labs ordere dby them follow-up as OP  Sinusitis-continue augmentin-suspect post-nasal drip HTN-permissive control only--would not aggressively control at this time HLD-change to crestor 40 daily for 2/2 prevention Mild malnutrition       lovenox full code inpatient d/w family   Verlon Au, MD  [facility] Direct contact: [phone] --Via amion app OR  --[email or web address]; password [email or web address]  7PM-7AM contact night coverage as above 11/27/2017, 3:03 PM  LOS: 2 days   Consultants:  neuro  Procedures:   multple  Antimicrobials:   n  Interval history/Subjective:  understandably emotional about diagnosis No new I cough cold or fevrs today   Objective:  Vitals:  Vitals:   11/27/17 0846 11/27/17 1217  BP: (!) 152/80 (!) 141/94  Pulse: 67 78  Resp: 15 15  Temp: 98.3 F (36.8 C) 99 F (37.2 C)  SpO2: 96% 93%    Exam:  Exam ovrall unchanged from prior  Awake pleasant no wheeze no ict pallor cta b no wheeze abd soft nt nd no rebound Power 5/5 bilat abd soft nt nd no reboudn no guard-no past pointing  I have personally reviewed the  following:   Labs:  aic 5.7  ldl 77-total chol 123  HIV +  Imaging studies: Ct IMPRESSION: CT HEAD:  1. RIGHT thalamus lacunar infarct demonstrated as acute on yesterday's MRI head. 2. Borderline parenchymal brain volume loss for age.  CTA NECK:  1. 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  CTA HEAD:  1. No emergent large vessel occlusion or flow-limiting stenosis. 2. Moderate stenosis RIGHT M1 origin and RIGHT V4 segment.  3. Mild intracranial atherosclerosis. Echo Conclusions  - Left ventricle: The cavity size was normal. Systolic function was   normal. The estimated ejection fraction was in the range of 60%   to 65%. Wall motion was normal; there were no regional wall   motion abnormalities. There was an increased relative   contribution of atrial contraction to ventricular filling, which   may be due to hypovolemia.  - Atrial septum: No defect or patent foramen ovale was identified.   Medical tests:     Test discussed with performing physician:    Decision to obtain old records:    Review and summation of old records:    Scheduled Meds: . amoxicillin-clavulanate  1 tablet Oral Q12H  . aspirin EC  81 mg Oral Daily  . bictegravir-emtricitabine-tenofovir AF  1 tablet Oral Daily  . clopidogrel  75 mg Oral Daily  . enoxaparin (LOVENOX) injection  40 mg Subcutaneous Q24H  . simvastatin  40 mg Oral q1800   Continuous Infusions: . sodium chloride 75 mL/hr at 11/27/17 0128    Principal Problem:   Cerebrovascular accident (CVA)  due to occlusion of cerebral artery (HCC) Active Problems:   HLD (hyperlipidemia)   Benign essential HTN   CVA (cerebral vascular accident) ([place])   Otitis media   Bronchitis   LOS: 2 days

## 2017-11-28 LAB — HEPATITIS B SURFACE ANTIGEN: HEP B S AG: NEGATIVE

## 2017-11-28 LAB — HEPATITIS C ANTIBODY

## 2017-11-28 LAB — RPR: RPR Ser Ql: NONREACTIVE

## 2017-11-28 LAB — HEPATITIS B SURFACE ANTIBODY, QUANTITATIVE: HEPATITIS B-POST: 6.9 m[IU]/mL — AB

## 2017-11-28 LAB — HEPATITIS A ANTIBODY, TOTAL: HEP A TOTAL AB: POSITIVE — AB

## 2017-11-28 MED ORDER — AMOXICILLIN-POT CLAVULANATE 875-125 MG PO TABS: 1.0000 | ORAL_TABLET | Freq: Two times a day (BID) | ORAL | 0 refills | Status: DC

## 2017-11-28 MED ORDER — CLOPIDOGREL BISULFATE 75 MG PO TABS: 75.0000 mg | ORAL_TABLET | Freq: Every day | ORAL | 11 refills | Status: DC

## 2017-11-28 MED ORDER — ASPIRIN 81 MG PO TBEC: 81.0000 mg | DELAYED_RELEASE_TABLET | Freq: Every day | ORAL | 0 refills | Status: DC

## 2017-11-28 NOTE — Discharge Summary (Signed)
Physician Discharge Summary  Cellie Dardis XBM:[id number] DOB: 03-29-1955 DOA: 11/25/2017  PCP: [facility] @ [place] date: 11/25/2017 Discharge date: 11/28/2017  Time spent: 45 minutes  Recommendations for Outpatient Follow-up:  1. Patient was diagnosed with a stroke this admission and will need aspirin and Plavix-aspirin for 3 weeks and then stop Plavix lifelong 2. New prescription of statin has been given 3. Patient was also diagnosed with HIV this admission and infectious disease was involved in they are managing her care and will see her as an outpatient-please note that family is not aware of this and that this should be kept confidential 4. Complete Augmentin for bronchitis, sinusitis  Discharge Diagnoses:  Principal Problem:   Cerebrovascular accident (CVA) due to occlusion of cerebral artery ([place]) Active Problems:   HLD (hyperlipidemia)   Benign essential HTN   CVA (cerebral vascular accident) ([place])   Otitis media   Bronchitis   Discharge Condition: improved  Diet recommendation: Heart healthy  Filed Weights   11/25/17 0704  Weight: 55 kg    History of present illness:  62 year old female originally from [place] HTN, [place], stroke right internal capsule 2016 with no residual deficit Presented with numbness of the left side of body on 11/20 also has had URI symptoms for 2 weeks prior to this-initial work-up blood pressure slightly elevated 169/90 prior exam Work-up showed acute infarct right middle lateral with small infarct left occipital and multifocal paranasal sinuses MRI showed right thalamic stroke corresponding to left sided numbness Neurology consulted  Hospital Course:  Right thalamic stroke-MR shows stroke-work-up done-cont asa and plavix-then plav alone after  3 weeks HIV-Id involved long discussion with them today-needs to start HAART-labs ordered by them follow-up as OP --will need follow-up of labs including TB testing as an  outpatient Sinusitis-continue augmentin finish 6 more tablets HTN-permissive control only--would not aggressively control at this time HLD-renew Zocor at present time for for 2/2 prevention Mild malnutrition   Consultations:  Neurology  Infectious disease  Discharge Exam: Vitals:   11/28/17 0336 11/28/17 0819  BP: 114/77 121/78  Pulse: (!) 58 64  Resp: 18 20  Temp: 98.3 F (36.8 C) 98.4 F (36.9 C)  SpO2: 100% 100%    General: Wake alert pleasant no deficit able to sit up no weakness Cardiovascular: S1-S2 no murmur rub or gallop Respiratory: Clinically clear no added sound  Discharge Instructions   Discharge Instructions    Ambulatory referral to Neurology   Complete by:  As directed    Follow up with stroke clinic NP (Jessica Vanschaick or Cecille Rubin, if both not available, consider Zachery Dauer, or [place]) at [facility] in about 4 weeks. Thanks.   Diet - low sodium heart healthy   Complete by:  As directed    Discharge instructions   Complete by:  As directed    You had a stoke-take aspirin and plaxiv for 3 weeks and follow with neurology--stop the aspirin after 3 weeks Follow with all of your regular MD's   Increase activity slowly   Complete by:  As directed      Allergies as of 11/28/2017   No Known Allergies     Medication List    STOP taking these medications   predniSONE 10 MG (21) Tbpk tablet Commonly known as:  STERAPRED UNI-PAK 21 TAB     TAKE these medications   amoxicillin-clavulanate 875-125 MG tablet Commonly known as:  AUGMENTIN Take 1 tablet by mouth every 12 (twelve) hours.   aspirin  81 MG EC tablet Take 1 tablet (81 mg total) by mouth daily.   benzonatate 100 MG capsule Commonly known as:  TESSALON Take 1 capsule (100 mg total) by mouth every 8 (eight) hours.   bictegravir-emtricitabine-tenofovir AF 50-200-25 MG Tabs tablet Commonly known as:  BIKTARVY Take 1 tablet by mouth daily.   clopidogrel 75 MG tablet Commonly known  as:  PLAVIX Take 1 tablet (75 mg total) by mouth daily. Start taking on:  11/29/2017   feeding supplement (ENSURE ENLIVE) Liqd Take 237 mLs by mouth 2 (two) times daily between meals.   lisinopril 5 MG tablet Commonly known as:  PRINIVIL,ZESTRIL Take 1 tablet (5 mg total) by mouth daily.   oxyCODONE-acetaminophen 5-325 MG tablet Commonly known as:  PERCOCET/ROXICET Take 1-2 tablets by mouth every 6 (six) hours as needed for pain.   senna-docusate 8.6-50 MG tablet Commonly known as:  Senokot-S Take 1 tablet by mouth at bedtime as needed for mild constipation or moderate constipation.   simvastatin 40 MG tablet Commonly known as:  ZOCOR Take 1 tablet (40 mg total) by mouth daily at 6 PM.      No Known Allergies Follow-up Information    Go to  [facility]   Contact information: [place] [phone] [facility]. Schedule an appointment as soon as possible for a visit in 4 week(s).   Specialty:  Neurology Contact information: [address] [place] [phone]           The results of significant diagnostics from this hospitalization (including imaging, microbiology, ancillary and laboratory) are listed below for reference.    Significant Diagnostic Studies: Ct Angio Head W Or Wo Contrast  Result Date: 11/26/2017 CLINICAL DATA:  Headache, LEFT extremity numbness. EXAM: CT ANGIOGRAPHY HEAD AND NECK TECHNIQUE: Multidetector CT imaging of the head and neck was performed using the standard protocol during bolus administration of intravenous contrast. Multiplanar CT image reconstructions and MIPs were obtained to evaluate the vascular anatomy. Carotid stenosis measurements (when applicable) are obtained utilizing NASCET criteria, using the distal internal carotid diameter as the denominator. CONTRAST:  74mL ISOVUE-370 IOPAMIDOL (ISOVUE-370)  INJECTION 76% COMPARISON:  CT HEAD and MRI head November 25, 2017 FINDINGS: CT HEAD FINDINGS BRAIN: No intraparenchymal hemorrhage, mass effect nor midline shift. Upper limits of normal for age; cavum septum pellucidum. RIGHT thalamus lacunar infarct no acute large vascular territory infarcts. No abnormal extra-axial fluid collections. Basal cisterns are patent. VASCULAR: Mild calcific atherosclerosis carotid siphons. SKULL/SOFT TISSUES: No skull fracture. No significant soft tissue swelling. ORBITS/SINUSES: The included ocular globes and orbital contents are normal.Acute on chronic pan paranasal sinusitis with LEFT maxillary bony remodeling. Mastoid air cells are well aerated. Mastoid air cells are well aerated. OTHER: None. CTA NECK FINDINGS: AORTIC ARCH: Normal appearance of the thoracic arch, 2 vessel arch is a normal variant. Mild calcific atherosclerosis aortic arch. The origins of the innominate, left Common carotid artery and subclavian artery are widely patent. RIGHT CAROTID SYSTEM: Common carotid artery is patent. Shelf-like filling defect posterior RIGHT ICA origin with poststenotic dilatation, cyst resultant 50% stenosis RIGHT ICA origin by NASCET criteria. No dissection flap or pseudoaneurysm. And no internal carotid artery is patent. LEFT CAROTID SYSTEM: Common carotid artery is patent. Normal appearance of the carotid bifurcation without hemodynamically significant stenosis by NASCET criteria. Normal appearance of the internal carotid artery. VERTEBRAL ARTERIES:Left vertebral artery is dominant. Normal appearance  of the vertebral arteries, widely patent. SKELETON: No acute osseous process though bone windows have not been submitted. OTHER NECK: Soft tissues of the neck are nonacute though, not tailored for evaluation. UPPER CHEST: Included lung apices are clear. No superior mediastinal lymphadenopathy. CTA HEAD FINDINGS: ANTERIOR CIRCULATION: Patent cervical internal carotid arteries, petrous,  cavernous and supra clinoid internal carotid arteries. Patent anterior communicating artery. Patent anterior and middle cerebral arteries. Moderate stenosis RIGHT M1 origin. Mild stenosis RIGHT M2 origin. Mild luminal irregularity middle cerebral arteries compatible with atherosclerosis. No large vessel occlusion, significant stenosis, contrast extravasation or aneurysm. POSTERIOR CIRCULATION: Patent vertebral arteries, vertebrobasilar junction and basilar artery, as well as main branch vessels. Moderate stenosis RIGHT V4 segment. Patent posterior cerebral arteries. Bilateral posterior communicating arteries present. Fetal origin RIGHT posterior cerebral artery, mild luminal irregularity compatible with atherosclerosis. No large vessel occlusion, significant stenosis, contrast extravasation or aneurysm. VENOUS SINUSES: Major dural venous sinuses are patent though not tailored for evaluation on this angiographic examination. ANATOMIC VARIANTS: None. DELAYED PHASE: No abnormal intracranial enhancement. MIP images reviewed. IMPRESSION: CT HEAD: 1. RIGHT thalamus lacunar infarct demonstrated as acute on yesterday's MRI head. 2. Borderline parenchymal brain volume loss for age. CTA NECK: 1. 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 CTA HEAD: 1. No emergent large vessel occlusion or flow-limiting stenosis. 2. Moderate stenosis RIGHT M1 origin and RIGHT V4 segment. 3. Mild intracranial atherosclerosis. Aortic Atherosclerosis (ICD10-I70.0). Electronically Signed   By: Elon Alas M.D.   On: 11/26/2017 17:12   Dg Chest 2 View  Result Date: 11/25/2017 CLINICAL DATA:  Left-sided numbness with cough EXAM: CHEST - 2 VIEW COMPARISON:  November 16, 2017 FINDINGS: There is no evident edema or consolidation. The heart size and pulmonary vascularity are normal. No adenopathy. No bone lesions. No pneumothorax. IMPRESSION: No edema or consolidation.  Electronically Signed   By: Lowella Grip III M.D.   On: 11/25/2017 08:15   Dg Chest 2 View  Result Date: 11/16/2017 CLINICAL DATA:  Cough for 5 days, productive cough with wheezing, fatigue, history hypertension EXAM: CHEST - 2 VIEW COMPARISON:  05/03/2012 FINDINGS: Enlargement of cardiac silhouette. Mediastinal contours and pulmonary vascularity normal. Lungs clear. No infiltrate, pleural effusion, or pneumothorax. Bones unremarkable. IMPRESSION: Enlargement of cardiac silhouette. No acute abnormalities. Electronically Signed   By: Lavonia Dana M.D.   On: 11/16/2017 11:39   Ct Head Wo Contrast  Result Date: 11/25/2017 CLINICAL DATA:  Left sided numbness EXAM: CT HEAD WITHOUT CONTRAST TECHNIQUE: Contiguous axial images were obtained from the base of the skull through the vertex without intravenous contrast. COMPARISON:  None. FINDINGS: Brain: The ventricles are normal in size and configuration. There is a small cavum septum pellucidum, an anatomic variant. There is decreased attenuation in the mid and lateral right occipital lobe regions with localized sulcal effacement in these areas, felt to represent an acute infarct in this area. There is somewhat more subtle decreased attenuation in the mid left occipital lobe, concerning for a smaller acute infarct in this area as well. There is no mass, hemorrhage, extra-axial fluid collection, or midline shift. Elsewhere brain parenchyma appears unremarkable. There is no demonstrable hyperdense vessel. There is calcification in each carotid siphon region. The bony calvarium appears intact. Skull: Bony calvarium appears intact. Sinuses/Orbits: There is an air-fluid level in the right sphenoid sinus. There is opacification of multiple ethmoid air cells bilaterally. There is opacification of the visualized superior left maxillary antrum. Visualized orbits  appear symmetric bilaterally. Other: Mastoid air cells are clear. IMPRESSION: 1. Acute appearing infarct  involving portions of the right mid and lateral occipital lobes with localized sulcal effacement. 2. Questionable smaller acute infarct in the mid left occipital lobe. 3. No mass or hemorrhage evident. Brain parenchyma elsewhere appears unremarkable. 4.  There is multifocal paranasal sinus disease. 5.  There are foci of arterial vascular calcification. Electronically Signed   By: Lowella Grip III M.D.   On: 11/25/2017 08:04   Ct Angio Neck W Or Wo Contrast  Result Date: 11/26/2017 CLINICAL DATA:  Headache, LEFT extremity numbness. EXAM: CT ANGIOGRAPHY HEAD AND NECK TECHNIQUE: Multidetector CT imaging of the head and neck was performed using the standard protocol during bolus administration of intravenous contrast. Multiplanar CT image reconstructions and MIPs were obtained to evaluate the vascular anatomy. Carotid stenosis measurements (when applicable) are obtained utilizing NASCET criteria, using the distal internal carotid diameter as the denominator. CONTRAST:  29mL ISOVUE-370 IOPAMIDOL (ISOVUE-370) INJECTION 76% COMPARISON:  CT HEAD and MRI head November 25, 2017 FINDINGS: CT HEAD FINDINGS BRAIN: No intraparenchymal hemorrhage, mass effect nor midline shift. Upper limits of normal for age; cavum septum pellucidum. RIGHT thalamus lacunar infarct no acute large vascular territory infarcts. No abnormal extra-axial fluid collections. Basal cisterns are patent. VASCULAR: Mild calcific atherosclerosis carotid siphons. SKULL/SOFT TISSUES: No skull fracture. No significant soft tissue swelling. ORBITS/SINUSES: The included ocular globes and orbital contents are normal.Acute on chronic pan paranasal sinusitis with LEFT maxillary bony remodeling. Mastoid air cells are well aerated. Mastoid air cells are well aerated. OTHER: None. CTA NECK FINDINGS: AORTIC ARCH: Normal appearance of the thoracic arch, 2 vessel arch is a normal variant. Mild calcific atherosclerosis aortic arch. The origins of the innominate,  left Common carotid artery and subclavian artery are widely patent. RIGHT CAROTID SYSTEM: Common carotid artery is patent. Shelf-like filling defect posterior RIGHT ICA origin with poststenotic dilatation, cyst resultant 50% stenosis RIGHT ICA origin by NASCET criteria. No dissection flap or pseudoaneurysm. And no internal carotid artery is patent. LEFT CAROTID SYSTEM: Common carotid artery is patent. Normal appearance of the carotid bifurcation without hemodynamically significant stenosis by NASCET criteria. Normal appearance of the internal carotid artery. VERTEBRAL ARTERIES:Left vertebral artery is dominant. Normal appearance of the vertebral arteries, widely patent. SKELETON: No acute osseous process though bone windows have not been submitted. OTHER NECK: Soft tissues of the neck are nonacute though, not tailored for evaluation. UPPER CHEST: Included lung apices are clear. No superior mediastinal lymphadenopathy. CTA HEAD FINDINGS: ANTERIOR CIRCULATION: Patent cervical internal carotid arteries, petrous, cavernous and supra clinoid internal carotid arteries. Patent anterior communicating artery. Patent anterior and middle cerebral arteries. Moderate stenosis RIGHT M1 origin. Mild stenosis RIGHT M2 origin. Mild luminal irregularity middle cerebral arteries compatible with atherosclerosis. No large vessel occlusion, significant stenosis, contrast extravasation or aneurysm. POSTERIOR CIRCULATION: Patent vertebral arteries, vertebrobasilar junction and basilar artery, as well as main branch vessels. Moderate stenosis RIGHT V4 segment. Patent posterior cerebral arteries. Bilateral posterior communicating arteries present. Fetal origin RIGHT posterior cerebral artery, mild luminal irregularity compatible with atherosclerosis. No large vessel occlusion, significant stenosis, contrast extravasation or aneurysm. VENOUS SINUSES: Major dural venous sinuses are patent though not tailored for evaluation on this angiographic  examination. ANATOMIC VARIANTS: None. DELAYED PHASE: No abnormal intracranial enhancement. MIP images reviewed. IMPRESSION: CT HEAD: 1. RIGHT thalamus lacunar infarct demonstrated as acute on yesterday's MRI head. 2. Borderline parenchymal brain volume loss for age. CTA NECK: 1. 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 CTA HEAD: 1. No emergent large vessel occlusion or flow-limiting stenosis. 2. Moderate stenosis RIGHT M1 origin and RIGHT V4 segment. 3. Mild intracranial atherosclerosis. Aortic Atherosclerosis (ICD10-I70.0). Electronically Signed   By: Elon Alas M.D.   On: 11/26/2017 17:12   Mr Brain Wo Contrast  Result Date: 11/25/2017 CLINICAL DATA:  Heaviness and numbness in the left upper extremity since midnight. Frontal headache EXAM: MRI HEAD WITHOUT CONTRAST TECHNIQUE: Multiplanar, multiecho pulse sequences of the brain and surrounding structures were obtained without intravenous contrast. COMPARISON:  Neck head CT earlier today FINDINGS: Brain: Acute lacunar infarct in the right thalamus. Occipital parietal appearance on previous head CT was artifactual. No hemorrhage, hydrocephalus, or masslike finding. FLAIR hyperintensity in the pons and periventricular white matter, likely mild chronic small vessel ischemia. Partially empty sella, nonspecific in isolation. Vascular: Major flow voids are preserved Skull and upper cervical spine: Negative for marrow lesion Sinuses/Orbits: Extensive mucosal thickening and secretions in the bilateral paranasal sinuses. IMPRESSION: 1. Acute lacunar infarct in the right thalamus. 2. Advanced generalized sinusitis. Electronically Signed   By: Monte Fantasia M.D.   On: 11/25/2017 10:54    Microbiology: No results found for this or any previous visit (from the past 240 hour(s)).   Labs: Basic Metabolic Panel: Recent Labs  Lab 11/25/17 0733 11/26/17 0354  NA 133* 134*  K 3.8 3.9   CL 97* 104  CO2 21* 22  GLUCOSE 110* 90  BUN 10 7*  CREATININE 0.77 0.81  CALCIUM 9.3 8.6*   Liver Function Tests: Recent Labs  Lab 11/25/17 0733  AST 32  ALT 33  ALKPHOS 56  BILITOT 1.0  PROT 9.5*  ALBUMIN 3.9   No results for input(s): LIPASE, AMYLASE in the last 168 hours. No results for input(s): AMMONIA in the last 168 hours. CBC: Recent Labs  Lab 11/25/17 0733 11/26/17 0354  WBC 7.3 5.4  NEUTROABS 3.8  --   HGB 13.1 11.4*  HCT 41.1 37.1  MCV 88.4 88.5  PLT 226 206   Cardiac Enzymes: Recent Labs  Lab 11/25/17 0733  TROPONINI <0.03   BNP: BNP (last 3 results) No results for input(s): BNP in the last 8760 hours.  ProBNP (last 3 results) No results for input(s): PROBNP in the last 8760 hours.  CBG: No results for input(s): GLUCAP in the last 168 hours.     Signed:  Nita Sells MD   [facility] 11/28/2017, 11:14 AM

## 2017-11-28 NOTE — Progress Notes (Signed)
Patient discharged home with son. Discharge instructions given. IV and telemetry discontinued. Questions asked and answered. Patient to transport from unit via wheelchair with staff and home with son. Wendee Copp

## 2017-11-28 NOTE — Care Management Note (Signed)
Case Management Note  Patient Details  Name: Sandra [name] MRN: [id number] Date of Birth: 1955/04/15  Subjective/Objective:                    Action/Plan:  Spoke w patient at bedside. Verified she had Biktarvy filled, she showed me filled Rx in her bag of home clothes. No other CM needs identified.    Expected Discharge Date:  11/28/17               Expected Discharge Plan:  Home/Self Care  In-House Referral:     Discharge planning Services  CM Consult, Medication Assistance  Post Acute Care Choice:    Choice offered to:     DME Arranged:    DME Agency:     HH Arranged:    HH Agency:     Status of Service:  Completed, signed off  If discussed at [employer] of Stay Meetings, dates discussed:    Additional Comments:  Carles Collet, RN 11/28/2017, 12:19 PM

## 2017-11-29 LAB — GLUCOSE 6 PHOSPHATE DEHYDROGENASE
G6PDH: 9.3 U/g{Hb} (ref 4.6–13.5)
Hemoglobin: 11.4 g/dL (ref 11.1–15.9)

## 2017-11-30 ENCOUNTER — Telehealth: Payer: Self-pay | Admitting: Family

## 2017-11-30 DIAGNOSIS — B20 Human immunodeficiency virus [HIV] disease: Secondary | ICD-10-CM | POA: Insufficient documentation

## 2017-11-30 LAB — T-HELPER CELLS (CD4) COUNT (NOT AT ARMC)
CD4 % Helper T Cell: 5 % — ABNORMAL LOW (ref 33–55)
CD4 T Cell Abs: 60 /uL — ABNORMAL LOW (ref 400–2700)

## 2017-11-30 MED ORDER — SULFAMETHOXAZOLE-TRIMETHOPRIM 800-160 MG PO TABS: 1.0000 | ORAL_TABLET | Freq: Every day | ORAL | 3 refills | Status: DC

## 2017-11-30 NOTE — Telephone Encounter (Signed)
Sandra [name] is a newly diagnosed HIV patient recently in [facility] and needs to establish care with [place]. She speaks [place] with "Twi" as the dialect. Please find a time to schedule follow up in the next couple of weeks with either myself or Dr. Baxter Flattery.   Thanks!

## 2017-11-30 NOTE — Addendum Note (Signed)
Addended by: Mauricio Po D on: 11/30/2017 11:18 AM   Modules accepted: Orders

## 2017-11-30 NOTE — Telephone Encounter (Signed)
In addition to information, please inform patient that her CD4 count is 60 which places her at risk for opportunistic infection. I am going to send in a prescription for Bactrim to her pharmacy for her to take daily.

## 2017-12-01 LAB — QUANTIFERON-TB GOLD PLUS (RQFGPL)
QUANTIFERON NIL VALUE: 0.13 [IU]/mL
QUANTIFERON TB2 AG VALUE: 0.1 [IU]/mL
QuantiFERON Mitogen Value: >10 IU/mL
QuantiFERON TB1 Ag Value: 0.14 IU/mL

## 2017-12-01 LAB — QUANTIFERON-TB GOLD PLUS: QuantiFERON-TB Gold Plus: NEGATIVE

## 2017-12-02 LAB — HLA B*5701: HLA B 5701: NEGATIVE

## 2017-12-04 ENCOUNTER — Other Ambulatory Visit: Payer: Self-pay

## 2017-12-04 NOTE — Patient Outreach (Signed)
[facility]) Care Management  12/04/2017  Sandra [name] 1955-09-07 [id number]     EMMI-STROKE RED ON EMMI ALERT Day # 3 Date: 12/03/17 Red Alert Reason: " Questions/problems with meds? Yes"   Outreach attempt # 1 to patient. Spoke with patient who speaks limited [place]. She requested that call be completed with her daughter-[name](DPR on file). Daughter voices that she normally translates for patient. Daughter states that she has been answering automated calls for patient. Reviewed and addressed red alert with daughter. She denies any issues or concerns regarding patient's meds. She states that patient has all her meds and they do not have any questions regarding them. Daughter inquiring about possible medication to help patient sleep better. Advised daughter that she will need to speak with PCP regarding that. She has not made MD follow up appt yet. She reports she called office and someone was supposed to call her back. She will follow up with MD office regarding appt. Daughter voices that she accompanies patient to all appts. She denies any further RN CM needs or concerns at this time. Advised daughter that they would continue to get automated EMMI-Stroke post discharge calls to assess how they are doing following recent hospitalization and will receive a call from a nurse if any of their responses were abnormal. She voiced understanding and was appreciative of f/u call.       Plan: RN CM will close case at this time as no further interventions needed.   Enzo Montgomery, RN,BSN,CCM [place] [facility] Telephonic Care Management Coordinator Direct Phone: [phone] Toll Free: [phone] Fax: [phone]

## 2017-12-09 ENCOUNTER — Ambulatory Visit: Payer: BLUE CROSS/BLUE SHIELD | Admitting: Neurology

## 2017-12-15 ENCOUNTER — Ambulatory Visit
Admission: RE | Admit: 2017-12-15 | Discharge: 2017-12-15 | Disposition: A | Discharge status: Home / Self Care | Payer: BLUE CROSS/BLUE SHIELD | Source: Ambulatory Visit | Attending: Family Medicine | Admitting: Family Medicine

## 2017-12-15 DIAGNOSIS — Z1231 Encounter for screening mammogram for malignant neoplasm of breast: Secondary | ICD-10-CM

## 2017-12-16 ENCOUNTER — Other Ambulatory Visit: Payer: Self-pay | Admitting: Family Medicine

## 2017-12-16 DIAGNOSIS — R928 Other abnormal and inconclusive findings on diagnostic imaging of breast: Secondary | ICD-10-CM

## 2017-12-31 LAB — HIV GENOSURE(R) MG

## 2017-12-31 LAB — REFLEX TO GENOSURE(R) MG EDI: HIV GenoSure(R): 1

## 2017-12-31 LAB — HIV-1 RNA, PCR (GRAPH) RFX/GENO EDI
HIV-1 RNA BY PCR: 380000 {copies}/mL
HIV-1 RNA Quant, Log: 5.58 log10copy/mL

## 2018-01-08 NOTE — Progress Notes (Signed)
[facility] [address] [place]. [place] [address] [phone]       OFFICE FOLLOW UP NOTE  [name] Date of Birth:  09-19-1955 Medical Record Number:  [id number]   Reason for Referral:  hospital stroke follow up  CHIEF COMPLAINT:  Chief Complaint  Patient presents with  . Follow-up    pt in room 9 with son in law who interprets for the patient. following up from hospital. pt states arm and leg are still heavy and decrease sensation.     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 History obtained from patient, son and chart review. Patient is originally from [place] and moved here approximately 7 years ago with her son with primary language [place].  Son interpreted for appointment and release form signed. Reviewed all radiology images and labs personally.  Sandra [name] is a 63 y.o. female with history of  Stroke, HTN, HA  who presented with  left face, arm and leg sensory numbness.  CT head reviewed and showed acute appearing infarct involving portions of the right mid and lateral occipital lobes with localized sulcal effacement.  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  It was felt as though these findings were asymptomatic as her stroke was within the PCA territory.  MRI brain reviewed and showed acute lacunar infarct in the right thalamus.  2D echo showed an EF of 60 to 65%.  LDL 77 and A1c 5.7.  It was felt as though her right thalamic infarct was secondary to small vessel disease.  She does have a history of prior infarct in 2016 in the right PLI C secondary to small vessel disease with right M1 stenosis.  She was followed in this office with Dr. Leonie Man previously.  Recommended DAPT for 3 weeks and Plavix alone as patient was previously on aspirin.   Patient was discharged in a stable condition without therapy needs.   .  She states she is doing well but does have some numbness and tingling in her left arm and leg with heavy sensation. She does believe this has been improving. She denies any associated pain. She is not currently doing any therapies as this was not recommended at discharge but she does stay active at home with exercise. She continues on both aspirin and plavix without bleeding or bruising. She continues to take simvastatin without side effects of myalgias. Blood pressure today 153/88. She does monitor at home but unable to remember the levels at home. She does currently live with her son and wife which was the prior living arrangement as well. She continues to do all ADLs independently without needed assistance.  No further concerns at this time.  Denies new or worsening stroke/TIA symptoms.    ROS:   14 system review of systems performed and negative with exception of numbness  PMH:  Past Medical History:  Diagnosis Date  . Arthritis   . Headache   . Hypertension   . Stroke [facility])     PSH:  Past Surgical History:  Procedure Laterality Date  . EYE SURGERY      Social History:  Social History   Socioeconomic History  . Marital status: Single    Spouse name: Not on file  . Number of children: Not on file  . Years of  education: Not on file  . Highest education level: Not on file  Occupational History  . Not on file  Social Needs  . Financial resource strain: Not hard at all  . Food insecurity:    Worry: Never true    Inability: Never true  . Transportation needs:    Medical: No    Non-medical: No  Tobacco Use  . Smoking status: Never Smoker  . Smokeless tobacco: Never Used  Substance and Sexual Activity  . Alcohol use: Never    Frequency: Never  . Drug use: Never  . Sexual activity: Not on file  Lifestyle  . Physical activity:    Days per week: Patient refused    Minutes per session: Patient  refused  . Stress: Not at all  Relationships  . Social connections:    Talks on phone: More than three times a week    Gets together: More than three times a week    Attends religious service: More than 4 times per year    Active member of club or organization: No    Attends meetings of clubs or organizations: Never    Relationship status: Patient refused  . Intimate partner violence:    Fear of current or ex partner: Patient refused    Emotionally abused: Patient refused    Physically abused: Patient refused    Forced sexual activity: Patient refused  Other Topics Concern  . Not on file  Social History Narrative   ** Merged History Encounter **        Family History: No family history on file.  Medications:   Current Outpatient Medications on File Prior to Visit  Medication Sig Dispense Refill  . benzonatate (TESSALON) 100 MG capsule Take 1 capsule (100 mg total) by mouth every 8 (eight) hours. 21 capsule 0  . bictegravir-emtricitabine-tenofovir AF (BIKTARVY) 50-200-25 MG TABS tablet Take 1 tablet by mouth daily. 30 tablet 2  . clopidogrel (PLAVIX) 75 MG tablet Take 1 tablet (75 mg total) by mouth daily. 30 tablet 11  . feeding supplement, ENSURE ENLIVE, (ENSURE ENLIVE) LIQD Take 237 mLs by mouth 2 (two) times daily between meals. 237 mL 12  . lisinopril (PRINIVIL,ZESTRIL) 5 MG tablet Take 1 tablet (5 mg total) by mouth daily. 60 tablet 1  . senna-docusate (SENOKOT-S) 8.6-50 MG tablet Take 1 tablet by mouth at bedtime as needed for mild constipation or moderate constipation. 30 tablet 0  . simvastatin (ZOCOR) 40 MG tablet Take 1 tablet (40 mg total) by mouth daily at 6 PM. 30 tablet 0  . amoxicillin-clavulanate (AUGMENTIN) 875-125 MG tablet Take 1 tablet by mouth every 12 (twelve) hours. (Patient not taking: Reported on 01/11/2018) 6 tablet 0  . sulfamethoxazole-trimethoprim (BACTRIM DS,SEPTRA DS) 800-160 MG tablet Take 1 tablet by mouth daily. (Patient not taking: Reported on  01/11/2018) 30 tablet 3   No current facility-administered medications on file prior to visit.     Allergies:  No Known Allergies   Physical Exam  Vitals:   01/11/18 0746  BP: (!) 153/88  Pulse: 66  Weight: 126 lb (57.2 kg)  Height: 5' (1.524 m)   Body mass index is 24.61 kg/m. No exam data present  General: well developed, well nourished, pleasant elderly African female, seated, in no evident distress Head: head normocephalic and atraumatic.   Neck: supple with no carotid or supraclavicular bruits Cardiovascular: regular rate and rhythm, no murmurs Musculoskeletal: no deformity Skin:  no rash/petichiae Vascular:  Normal pulses all extremities  Neurologic  Exam Mental Status: Awake and fully alert. Oriented to place and time. Recent and remote memory intact. Attention span, concentration and fund of knowledge appropriate. Mood and affect appropriate.  Cranial Nerves: Fundoscopic exam reveals sharp disc margins. Pupils equal, briskly reactive to light. Extraocular movements full without nystagmus. Visual fields full to confrontation. Hearing intact. Facial sensation intact. Face, tongue, palate moves normally and symmetrically.  Motor: Normal bulk and tone. Normal strength in all tested extremity muscles. Sensory.:  Sensation decreased left upper and lower extremity compared to right upper and lower extremity Coordination: Rapid alternating movements normal in all extremities. Finger-to-nose and heel-to-shin performed accurately bilaterally. Gait and Station: Arises from chair without difficulty. Stance is normal. Gait demonstrates normal stride length and balance. Able to heel, toe and tandem walk without difficulty.  Reflexes: 1+ and symmetric. Toes downgoing.    NIHSS  1 Modified Rankin  1   Diagnostic Data (Labs, Imaging, Testing)  CT HEAD WO CONTRAST 11/25/2017 IMPRESSION: 1. Acute appearing infarct involving portions of the right mid and lateral occipital lobes with  localized sulcal effacement. 2. Questionable smaller acute infarct in the mid left occipital lobe. 3. No mass or hemorrhage evident. Brain parenchyma elsewhere appears unremarkable. 4.  There is multifocal paranasal sinus disease. 5.  There are foci of arterial vascular calcification.  CT ANGIO HEAD W OR WO CONTRAST CT ANGIO NECK W OR WO CONTRAST 11/26/2017 CTA NECK:  1. 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  CTA HEAD:  1. No emergent large vessel occlusion or flow-limiting stenosis. 2. Moderate stenosis RIGHT M1 origin and RIGHT V4 segment. 3. Mild intracranial atherosclerosis.  MR BRAIN WO CONTRAST 11/25/2017 IMPRESSION: 1. Acute lacunar infarct in the right thalamus. 2. Advanced generalized sinusitis.  ECHOCARDIOGRAM 11/26/2017 Impressions:  - Normal LV size with EF 60-65%. Normal RV size and systolic   function. No significant valvular abnormalities. Negative bubble   study.   ASSESSMENT: Sandra [name] is a 63 y.o. year old female here with right thalamic infarct on 11/25/2017 secondary to small vessel disease. Vascular risk factors include HTN, HLD and prior stroke 2016.  Patient is being seen today for hospital follow-up and overall is doing well with continued deficits of left paresthesias but otherwise has been stable without worsening or new stroke/TIA symptoms.    PLAN:  1. Right chronic infarct: Continue clopidogrel 75 mg daily  and Zocor for secondary stroke prevention.  Advised patient and son to discontinue aspirin 81 mg at this time as 3-week DAPT completed and continue on Plavix alone.  Maintain strict control of hypertension with blood pressure goal below 130/90, diabetes with hemoglobin A1c goal below 6.5% and cholesterol with LDL cholesterol (bad cholesterol) goal below 70 mg/dL.  I also advised the patient to eat a healthy diet with plenty of whole grains, cereals, fruits  and vegetables, exercise regularly with at least 30 minutes of continuous activity daily and maintain ideal body weight. 2. HTN: Advised to continue current treatment regimen.  Today's BP 153/88.  Advised to continue to monitor at home along with continued follow-up with PCP for management 3. HLD: Advised to continue current treatment regimen along with continued follow-up with PCP for future prescribing and monitoring of lipid panel     Follow up in 3 months or call earlier if needed   Greater than 50% of time during this 25 minute visit was spent on counseling, explanation of diagnosis of right thalamic infarct, reviewing  risk factor management of HTN, HLD and DM, planning of further management along with potential future management, and discussion with patient and family answering all questions.    Venancio Poisson, AGNP-BC  [facility] [address] [place], [place] [address]  Phone [phone] Fax [phone] Note: This document was prepared with digital dictation and possible smart phrase technology. Any transcriptional errors that result from this process are unintentional.

## 2018-01-11 ENCOUNTER — Ambulatory Visit: Payer: BLUE CROSS/BLUE SHIELD | Admitting: Adult Health

## 2018-01-11 ENCOUNTER — Encounter: Payer: Self-pay | Admitting: Adult Health

## 2018-01-11 VITALS — BP 153/88 | HR 66 | Ht 60.0 in | Wt 126.0 lb

## 2018-01-11 DIAGNOSIS — E785 Hyperlipidemia, unspecified: Secondary | ICD-10-CM | POA: Diagnosis not present

## 2018-01-11 DIAGNOSIS — I639 Cerebral infarction, unspecified: Secondary | ICD-10-CM

## 2018-01-11 DIAGNOSIS — I10 Essential (primary) hypertension: Secondary | ICD-10-CM

## 2018-01-11 DIAGNOSIS — I6381 Other cerebral infarction due to occlusion or stenosis of small artery: Secondary | ICD-10-CM

## 2018-01-11 NOTE — Patient Instructions (Signed)
Continue clopidogrel 75 mg daily  and Zocor  for secondary stroke prevention  Stop aspirin at this time and continue on plavix only  Continue to follow up with PCP regarding cholesterol and blood pressure management   Continue to stay active and maintain a healthy diet  Continue to monitor blood pressure at home  Maintain strict control of hypertension with blood pressure goal below 130/90, diabetes with hemoglobin A1c goal below 6.5% and cholesterol with LDL cholesterol (bad cholesterol) goal below 70 mg/dL. I also advised the patient to eat a healthy diet with plenty of whole grains, cereals, fruits and vegetables, exercise regularly and maintain ideal body weight.  Followup in the future with me in 3 months or call earlier if needed       Thank you for coming to see [place] at [facility]. I hope we have been able to provide you high quality care today.  You may receive a patient satisfaction survey over the next few weeks. We would appreciate your feedback and comments so that we may continue to improve ourselves and the health of our patients.

## 2018-01-12 MED FILL — BIKTARVY 50-200-25 MG TABS: 50-200-25 | 30 days supply | Qty: 30 | Fill #1

## 2018-01-22 NOTE — Progress Notes (Signed)
I agree with the above plan 

## 2018-02-17 MED FILL — BIKTARVY 50-200-25 MG TABS: 50-200-25 | 30 days supply | Qty: 30 | Fill #2

## 2018-03-23 MED FILL — BIKTARVY 50-200-25 MG TABS: 50-200-25 | 30 days supply | Qty: 30 | Fill #0

## 2018-04-11 NOTE — Progress Notes (Signed)
[facility] [address] [place]. [place] [address] [phone]       VIRTUAL VISIT FOLLOW UP NOTE  Ms. Marletta Bousquet Date of Birth:  Sep 18, 1955 Medical Record Number:  [id number]   Reason for Referral:  hospital stroke follow up   Virtual Visit via Video Note  I connected with David Stall on 04/12/18 at  8:45 AM EDT by a video enabled telemedicine application located remotely within my own home and verified that I am speaking with the correct person using two identifiers who is located within her her own home along with assistance of her two daughters to help with the video visit and interpreting as she primarily speaks Twi.   I discussed the limitations of evaluation and management by telemedicine and the availability of in person appointments. The patient expressed understanding and agreed to proceed.  CHIEF COMPLAINT:  Chief Complaint  Patient presents with   Follow-up    stroke follow up - continues to have left arm and leg numbness/tingling     HPI:  Ms. Weng is a 63 year old female who initially had face-to-face in office visit scheduled today for follow-up regarding right thalamic infarct in 11/2017 but due to [place] pandemic, in office visits limited therefore transition to telemedicine visit via WebEx.  She has been stable from a stroke standpoint without new or worsening stroke/TIA symptoms and residual deficits of left arm and leg numbness and tingling with heaviness sensation. She does endorse improvement since prior visit. Denies any associated pain. She is able to maintain all ADL's and IADLs.  She continues on Plavix without side effects of bleeding or bruising.  Continues on simvastatin without side effects myalgias.  Blood pressure this morning 182/100 with heart rate 63.  She has not taken her blood pressure medications yet this morning as she is currently on lisinopril 5 mg but she does typically monitor after she takes antihypertensive  with systolics typically 259D.  She has not recently followed up with her PCP and plans on following up with her in 05/2018.  She denies any symptoms associated with elevated BP.  She denies any episodes of hypotension.  No further concerns.  Denies new or worsening stroke/TIA symptoms.    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 History obtained from patient, son and chart review. Patient is originally from [place] and moved here approximately 7 years ago with her son with primary language Twi.  Son interpreted for appointment and release form signed. Reviewed all radiology images and labs personally.  Ms. Tiesha Marich is a 63 y.o. female with history of  Stroke, HTN, HA  who presented with  left face, arm and leg sensory numbness.  CT head reviewed and showed acute appearing infarct involving portions of the right mid and lateral occipital lobes with localized sulcal effacement.  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  It was felt as though these findings were asymptomatic as her stroke was within the PCA territory.  MRI brain reviewed and showed acute lacunar infarct in the right thalamus.  2D echo showed an EF of 60 to 65%.  LDL 77 and A1c 5.7.  It was felt as though her right thalamic infarct was secondary to small vessel disease.  She does  have a history of prior infarct in 2016 in the right PLI C secondary to small vessel disease with right M1 stenosis.  She was followed in this office with Dr. Leonie Man previously.  Recommended DAPT for 3 weeks and Plavix alone as patient was previously on aspirin.  Patient was discharged in a stable condition without therapy needs.   .  She states she is doing well but does have some numbness  and tingling in her left arm and leg with heavy sensation. She does believe this has been improving. She denies any associated pain. She is not currently doing any therapies as this was not recommended at discharge but she does stay active at home with exercise. She continues on both aspirin and plavix without bleeding or bruising. She continues to take simvastatin without side effects of myalgias. Blood pressure today 153/88. She does monitor at home but unable to remember the levels at home. She does currently live with her son and wife which was the prior living arrangement as well. She continues to do all ADLs independently without needed assistance.  No further concerns at this time.  Denies new or worsening stroke/TIA symptoms.    ROS:   14 system review of systems performed and negative with exception of numbness/tingling  PMH:  Past Medical History:  Diagnosis Date   Arthritis    Headache    Hypertension    Stroke ([place])     PSH:  Past Surgical History:  Procedure Laterality Date   EYE SURGERY      Social History:  Social History   Socioeconomic History   Marital status: Single    Spouse name: Not on file   Number of children: Not on file   Years of education: Not on file   Highest education level: Not on file  Occupational History   Not on file  Social Needs   Financial resource strain: Not hard at all   Food insecurity:    Worry: Never true    Inability: Never true   Transportation needs:    Medical: No    Non-medical: No  Tobacco Use   Smoking status: Never Smoker   Smokeless tobacco: Never Used  Substance and Sexual Activity   Alcohol use: Never    Frequency: Never   Drug use: Never   Sexual activity: Not on file  Lifestyle   Physical activity:    Days per week: Patient refused    Minutes per session: Patient refused   Stress: Not at all  Relationships   Social connections:    Talks on phone: More than three times a week    Gets  together: More than three times a week    Attends religious service: More than 4 times per year    Active member of club or organization: No    Attends meetings of clubs or organizations: Never    Relationship status: Patient refused   Intimate partner violence:    Fear of current or ex partner: Patient refused    Emotionally abused: Patient refused    Physically abused: Patient refused    Forced sexual activity: Patient refused  Other Topics Concern   Not on file  Social History Narrative   ** Merged History Encounter **        Family History: No family history on file.  Medications:   Current Outpatient Medications on File Prior to Visit  Medication Sig Dispense Refill   amoxicillin-clavulanate (AUGMENTIN) 875-125 MG tablet Take 1 tablet by  mouth every 12 (twelve) hours. (Patient not taking: Reported on 01/11/2018) 6 tablet 0   benzonatate (TESSALON) 100 MG capsule Take 1 capsule (100 mg total) by mouth every 8 (eight) hours. 21 capsule 0   bictegravir-emtricitabine-tenofovir AF (BIKTARVY) 50-200-25 MG TABS tablet Take 1 tablet by mouth daily. 30 tablet 2   clopidogrel (PLAVIX) 75 MG tablet Take 1 tablet (75 mg total) by mouth daily. 30 tablet 11   feeding supplement, ENSURE ENLIVE, (ENSURE ENLIVE) LIQD Take 237 mLs by mouth 2 (two) times daily between meals. 237 mL 12   lisinopril (PRINIVIL,ZESTRIL) 5 MG tablet Take 1 tablet (5 mg total) by mouth daily. 60 tablet 1   senna-docusate (SENOKOT-S) 8.6-50 MG tablet Take 1 tablet by mouth at bedtime as needed for mild constipation or moderate constipation. 30 tablet 0   simvastatin (ZOCOR) 40 MG tablet Take 1 tablet (40 mg total) by mouth daily at 6 PM. 30 tablet 0   sulfamethoxazole-trimethoprim (BACTRIM DS,SEPTRA DS) 800-160 MG tablet Take 1 tablet by mouth daily. (Patient not taking: Reported on 01/11/2018) 30 tablet 3   No current facility-administered medications on file prior to visit.     Allergies:  No Known  Allergies   Physical Exam  Vitals:   04/12/18 0859  BP: (!) 182/100  Pulse: 63   *Vital signs obtained by patient and family members within their own home with her own device*  General: well developed, well nourished, pleasant middle aged African female primarily speaking Twi, seated, in no evident distress  Neurologic Exam Mental Status: Awake and fully alert. Oriented to place and time. Recent and remote memory intact. Attention span, concentration and fund of knowledge appropriate. Mood and affect appropriate.  Cranial Nerves: Extraocular movements full without nystagmus. Hearing intact to voice. Facial sensation intact. Face, tongue, palate moves normally and symmetrically.  Motor: no evidence of weakness per drift assessment Sensory.:  Sensation decreased left upper and lower extremity compared to right upper and lower extremity (obtained from assistance by daughter with light touch) Coordination: Rapid alternating movements normal in all extremities. Finger-to-nose and heel-to-shin performed accurately bilaterally. Gait and Station: Arises from chair without difficulty. Stance is normal. Gait demonstrates normal stride length and balance. Able to heel, toe and tandem walk without difficulty.  Reflexes: unable to assess     Diagnostic Data (Labs, Imaging, Testing)  CT HEAD WO CONTRAST 11/25/2017 IMPRESSION: 1. Acute appearing infarct involving portions of the right mid and lateral occipital lobes with localized sulcal effacement. 2. Questionable smaller acute infarct in the mid left occipital lobe. 3. No mass or hemorrhage evident. Brain parenchyma elsewhere appears unremarkable. 4.  There is multifocal paranasal sinus disease. 5.  There are foci of arterial vascular calcification.  CT ANGIO HEAD W OR WO CONTRAST CT ANGIO NECK W OR WO CONTRAST 11/26/2017 CTA NECK:  1. 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  CTA HEAD:  1. No emergent large vessel occlusion or flow-limiting stenosis. 2. Moderate stenosis RIGHT M1 origin and RIGHT V4 segment. 3. Mild intracranial atherosclerosis.  MR BRAIN WO CONTRAST 11/25/2017 IMPRESSION: 1. Acute lacunar infarct in the right thalamus. 2. Advanced generalized sinusitis.  ECHOCARDIOGRAM 11/26/2017 Impressions:  - Normal LV size with EF 60-65%. Normal RV size and systolic   function. No significant valvular abnormalities. Negative bubble   study.   ASSESSMENT: Madyn Ivins is a 62 y.o. year old female here with right thalamic infarct on 11/25/2017 secondary to  small vessel disease. Vascular risk factors include HTN, HLD and prior stroke 2016.  She has been doing well from a stroke standpoint with residual deficits of left upper and lower extremity numbness/tingling and decreased sensation.  BP has also been elevated despite compliance with antihypertensives.    PLAN:  1. Right chronic infarct: Continue clopidogrel 75 mg daily  and Zocor for secondary stroke prevention.  Advised patient and son to discontinue aspirin 81 mg at this time as 3-week DAPT completed and continue on Plavix alone.  Maintain strict control of hypertension with blood pressure goal below 130/90, diabetes with hemoglobin A1c goal below 6.5% and cholesterol with LDL cholesterol (bad cholesterol) goal below 70 mg/dL.  I also advised the patient to eat a healthy diet with plenty of whole grains, cereals, fruits and vegetables, exercise regularly with at least 30 minutes of continuous activity daily and maintain ideal body weight. 2. HTN: Increase lisinopril dose from 5 mg to 10 mg daily.  Advised to call office in approximately 4 weeks with update on BP readings.  Advised to check BP 1 to 2 hours after lisinopril dose along with checking in the evening.  Today's BP 182/100 without a.m. antihypertensives.  She does routinely monitor at home with systolics typically  509T.  She will follow-up with PCP in 05/2018 who can further manage hypertension along with medication management 3. HLD: Advised to continue current treatment regimen along with continued follow-up with PCP for future prescribing and monitoring of lipid panel     Follow up in 6 months or call earlier if needed   Greater than 50% of time during this 32 minute visit was spent on counseling, explanation of diagnosis of right thalamic infarct, reviewing risk factor management of HTN, HLD and DM, discussion regarding continued HTN despite medication compliance, planning of further management along with potential future management, and discussion with patient and family answering all questions.    Venancio Poisson, AGNP-BC  [facility] [address] [place], [place] [address]  Phone [phone] Fax [phone] Note: This document was prepared with digital dictation and possible smart phrase technology. Any transcriptional errors that result from this process are unintentional.

## 2018-04-12 ENCOUNTER — Ambulatory Visit (INDEPENDENT_AMBULATORY_CARE_PROVIDER_SITE_OTHER): Payer: BLUE CROSS/BLUE SHIELD | Admitting: Adult Health

## 2018-04-12 ENCOUNTER — Encounter: Payer: Self-pay | Admitting: Adult Health

## 2018-04-12 ENCOUNTER — Other Ambulatory Visit: Payer: Self-pay

## 2018-04-12 VITALS — BP 182/100 | HR 63

## 2018-04-12 DIAGNOSIS — I10 Essential (primary) hypertension: Secondary | ICD-10-CM | POA: Diagnosis not present

## 2018-04-12 DIAGNOSIS — R200 Anesthesia of skin: Secondary | ICD-10-CM | POA: Diagnosis not present

## 2018-04-12 DIAGNOSIS — I639 Cerebral infarction, unspecified: Secondary | ICD-10-CM

## 2018-04-12 DIAGNOSIS — I6381 Other cerebral infarction due to occlusion or stenosis of small artery: Secondary | ICD-10-CM

## 2018-04-12 DIAGNOSIS — E785 Hyperlipidemia, unspecified: Secondary | ICD-10-CM | POA: Diagnosis not present

## 2018-04-12 DIAGNOSIS — R202 Paresthesia of skin: Secondary | ICD-10-CM

## 2018-04-12 MED ORDER — LISINOPRIL 10 MG PO TABS: 10.0000 mg | ORAL_TABLET | Freq: Every day | ORAL | 1 refills | Status: DC

## 2018-04-12 NOTE — Patient Instructions (Signed)
Continue clopidogrel 75 mg daily  and simvastatin for secondary stroke prevention  Increase lisinopril dose from 5 mg daily to 10 mg daily -please notify office in approximately 4 weeks regarding BP levels and follow-up with PCP in 05/2018 for ongoing blood pressure and medication management  Continue to follow up with PCP regarding HTN and HLD management   Continue to stay active and maintain a healthy diet  Continue to monitor blood pressure at home -please record levels with obtaining levels 1 to 2 hours after dose of lisinopril and also check in the evening.  Ensure when you check your blood pressure you are sitting quietly with your feet flat on the ground for approximately 5 minutes prior to checking blood pressure levels.  Please also bring your home BP cuff with you to your PCP appointment to ensure it is working properly and correlates with manual blood pressure reading.  Maintain strict control of hypertension with blood pressure goal below 130/90, diabetes with hemoglobin A1c goal below 6.5% and cholesterol with LDL cholesterol (bad cholesterol) goal below 70 mg/dL. I also advised the patient to eat a healthy diet with plenty of whole grains, cereals, fruits and vegetables, exercise regularly and maintain ideal body weight.  Followup in the future with me in 6 months or call earlier if needed       Thank you for coming to see [place] at [facility]. I hope we have been able to provide you high quality care today.  You may receive a patient satisfaction survey over the next few weeks. We would appreciate your feedback and comments so that we may continue to improve ourselves and the health of our patients.

## 2018-04-18 NOTE — Progress Notes (Signed)
I agree with the above plan 

## 2018-04-28 MED FILL — BIKTARVY 50-200-25 MG TABS: 50-200-25 | 30 days supply | Qty: 30 | Fill #1

## 2018-06-08 MED FILL — BIKTARVY 50-200-25 MG TABS: 50-200-25 | 30 days supply | Qty: 30 | Fill #2

## 2018-10-13 ENCOUNTER — Ambulatory Visit: Payer: BLUE CROSS/BLUE SHIELD | Admitting: Adult Health

## 2018-10-13 ENCOUNTER — Encounter: Payer: Self-pay | Admitting: Adult Health

## 2018-10-13 ENCOUNTER — Telehealth: Payer: Self-pay

## 2018-10-13 NOTE — Progress Notes (Deleted)
[facility] [address] [place]. [place] [address] [phone]       OFFICE FOLLOW UP NOTE  [name] Date of Birth:  24-Oct-1955 Medical Record Number:  [id number]   Reason for Referral:  hospital stroke follow up    CHIEF COMPLAINT:  No chief complaint on file.   HPI:  Update 10/13/2018: [name] is being seen today for stroke follow-up.  Residual deficits of left hemisensory impairment which has been stable without worsening.  Continues on Plavix and simvastatin for secondary stroke prevention without side effects.  Blood pressure today ***.  Denies new or worsening stroke/TIA symptoms.   Virtual visit 04/12/2018: [name] is a 63 year old female who initially had face-to-face in office visit scheduled today for follow-up regarding right thalamic infarct in 11/2017 but due to [place] pandemic, in office visits limited therefore transition to telemedicine visit via WebEx.  She has been stable from a stroke standpoint without new or worsening stroke/TIA symptoms and residual deficits of left arm and leg numbness and tingling with heaviness sensation. She does endorse improvement since prior visit. Denies any associated pain. She is able to maintain all ADL's and IADLs.  She continues on Plavix without side effects of bleeding or bruising.  Continues on simvastatin without side effects myalgias.  Blood pressure this morning 182/100 with heart rate 63.  She has not taken her blood pressure medications yet this morning as she is currently on lisinopril 5 mg but she does typically monitor after she takes antihypertensive with systolics typically [id number].  She has not recently followed up with her PCP and plans on following up with her in 05/2018.  She denies any symptoms associated with elevated BP.  She denies any episodes of hypotension.  No further concerns.  Denies new or worsening stroke/TIA symptoms.    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 History obtained from patient, son and chart review. Patient is originally from [name] and moved here approximately 7 years ago with her son with primary language Twi.  Son interpreted for appointment and release form signed. Reviewed all radiology images and labs personally.  [name] is a 63 y.o. female with history of  Stroke, HTN, HA  who presented with  left face, arm and leg sensory numbness.  CT head reviewed and showed acute appearing infarct involving portions of the right mid and lateral occipital lobes with localized sulcal effacement.  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  It was felt as though these findings were asymptomatic as her stroke was within the PCA territory.  MRI brain reviewed and showed acute lacunar infarct in the right thalamus.  2D echo showed an EF of 60 to 65%.  LDL 77 and A1c 5.7.  It was felt as though her right thalamic infarct was secondary to small vessel disease.  She does have a history of prior infarct in 2016 in the right PLI C secondary to small vessel disease with right M1 stenosis.  She was followed in this office with Dr. Leonie [name] previously.  Recommended DAPT for 3 weeks and Plavix alone as patient was previously on aspirin.  Patient was discharged in a stable condition without therapy needs.   .  She states she  is doing well but does have some numbness and tingling in her left arm and leg with heavy sensation. She does believe this has been improving. She denies any associated pain. She is not currently doing any therapies as this was not recommended at discharge but she does stay active at home with exercise. She continues on both aspirin and plavix without bleeding or bruising. She  continues to take simvastatin without side effects of myalgias. Blood pressure today 153/88. She does monitor at home but unable to remember the levels at home. She does currently live with her son and wife which was the prior living arrangement as well. She continues to do all ADLs independently without needed assistance.  No further concerns at this time.  Denies new or worsening stroke/TIA symptoms.    ROS:   14 system review of systems performed and negative with exception of numbness/tingling  PMH:  Past Medical History:  Diagnosis Date  . Arthritis   . Headache   . Hypertension   . Stroke [facility])     PSH:  Past Surgical History:  Procedure Laterality Date  . EYE SURGERY      Social History:  Social History   Socioeconomic History  . Marital status: Single    Spouse name: Not on file  . Number of children: Not on file  . Years of education: Not on file  . Highest education level: Not on file  Occupational History  . Not on file  Social Needs  . Financial resource strain: Not hard at all  . Food insecurity    Worry: Never true    Inability: Never true  . Transportation needs    Medical: No    Non-medical: No  Tobacco Use  . Smoking status: Never Smoker  . Smokeless tobacco: Never Used  Substance and Sexual Activity  . Alcohol use: Never    Frequency: Never  . Drug use: Never  . Sexual activity: Not on file  Lifestyle  . Physical activity    Days per week: Patient refused    Minutes per session: Patient refused  . Stress: Not at all  Relationships  . Social connections    Talks on phone: More than three times a week    Gets together: More than three times a week    Attends religious service: More than 4 times per year    Active member of club or organization: No    Attends meetings of clubs or organizations: Never    Relationship status: Patient refused  . Intimate partner violence    Fear of current or ex partner: Patient refused    Emotionally  abused: Patient refused    Physically abused: Patient refused    Forced sexual activity: Patient refused  Other Topics Concern  . Not on file  Social History Narrative   ** Merged History Encounter **        Family History: No family history on file.  Medications:   Current Outpatient Medications on File Prior to Visit  Medication Sig Dispense Refill  . amoxicillin-clavulanate (AUGMENTIN) 875-125 MG tablet Take 1 tablet by mouth every 12 (twelve) hours. (Patient not taking: Reported on 01/11/2018) 6 tablet 0  . benzonatate (TESSALON) 100 MG capsule Take 1 capsule (100 mg total) by mouth every 8 (eight) hours. 21 capsule 0  . bictegravir-emtricitabine-tenofovir AF (BIKTARVY) 50-200-25 MG TABS tablet Take 1 tablet by mouth daily. 30 tablet 2  . clopidogrel (PLAVIX) 75 MG tablet Take 1 tablet (75 mg  total) by mouth daily. 30 tablet 11  . feeding supplement, ENSURE ENLIVE, (ENSURE ENLIVE) LIQD Take 237 mLs by mouth 2 (two) times daily between meals. 237 mL 12  . lisinopril (PRINIVIL,ZESTRIL) 10 MG tablet Take 1 tablet (10 mg total) by mouth daily. 30 tablet 1  . senna-docusate (SENOKOT-S) 8.6-50 MG tablet Take 1 tablet by mouth at bedtime as needed for mild constipation or moderate constipation. 30 tablet 0  . simvastatin (ZOCOR) 40 MG tablet Take 1 tablet (40 mg total) by mouth daily at 6 PM. 30 tablet 0  . sulfamethoxazole-trimethoprim (BACTRIM DS,SEPTRA DS) 800-160 MG tablet Take 1 tablet by mouth daily. (Patient not taking: Reported on 01/11/2018) 30 tablet 3   No current facility-administered medications on file prior to visit.     Allergies:  No Known Allergies   Physical Exam  There were no vitals filed for this visit. *Vital signs obtained by patient and family members within their own home with her own device*  General: well developed, well nourished, pleasant middle aged African female primarily speaking Twi, seated, in no evident distress  Neurologic Exam Mental Status:  Awake and fully alert. Oriented to place and time. Recent and remote memory intact. Attention span, concentration and fund of knowledge appropriate. Mood and affect appropriate.  Cranial Nerves: Extraocular movements full without nystagmus. Hearing intact to voice. Facial sensation intact. Face, tongue, palate moves normally and symmetrically.  Motor: no evidence of weakness per drift assessment Sensory.:  Sensation decreased left upper and lower extremity compared to right upper and lower extremity (obtained from assistance by daughter with light touch) Coordination: Rapid alternating movements normal in all extremities. Finger-to-nose and heel-to-shin performed accurately bilaterally. Gait and Station: Arises from chair without difficulty. Stance is normal. Gait demonstrates normal stride length and balance. Able to heel, toe and tandem walk without difficulty.  Reflexes: unable to assess     Diagnostic Data (Labs, Imaging, Testing)  CT HEAD WO CONTRAST 11/25/2017 IMPRESSION: 1. Acute appearing infarct involving portions of the right mid and lateral occipital lobes with localized sulcal effacement. 2. Questionable smaller acute infarct in the mid left occipital lobe. 3. No mass or hemorrhage evident. Brain parenchyma elsewhere appears unremarkable. 4.  There is multifocal paranasal sinus disease. 5.  There are foci of arterial vascular calcification.  CT ANGIO HEAD W OR WO CONTRAST CT ANGIO NECK W OR WO CONTRAST 11/26/2017 CTA NECK:  1. 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  CTA HEAD:  1. No emergent large vessel occlusion or flow-limiting stenosis. 2. Moderate stenosis RIGHT M1 origin and RIGHT V4 segment. 3. Mild intracranial atherosclerosis.  MR BRAIN WO CONTRAST 11/25/2017 IMPRESSION: 1. Acute lacunar infarct in the right thalamus. 2. Advanced generalized sinusitis.  ECHOCARDIOGRAM  11/26/2017 Impressions:  - Normal LV size with EF 60-65%. Normal RV size and systolic   function. No significant valvular abnormalities. Negative bubble   study.   ASSESSMENT: Tyshae [name] is a 63 y.o. year old female here with right thalamic infarct on 11/25/2017 secondary to small vessel disease. Vascular risk factors include HTN, HLD and prior stroke 2016.  She has been doing well from a stroke standpoint with residual deficits of left upper and lower extremity numbness/tingling and decreased sensation.  BP has also been elevated despite compliance with antihypertensives.    PLAN:  1. Right chronic infarct: Continue clopidogrel 75 mg daily  and Zocor for secondary stroke prevention.  Advised patient and son  to discontinue aspirin 81 mg at this time as 3-week DAPT completed and continue on Plavix alone.  Maintain strict control of hypertension with blood pressure goal below 130/90, diabetes with hemoglobin A1c goal below 6.5% and cholesterol with LDL cholesterol (bad cholesterol) goal below 70 mg/dL.  I also advised the patient to eat a healthy diet with plenty of whole grains, cereals, fruits and vegetables, exercise regularly with at least 30 minutes of continuous activity daily and maintain ideal body weight. 2. HTN: Increase lisinopril dose from 5 mg to 10 mg daily.  Advised to call office in approximately 4 weeks with update on BP readings.  Advised to check BP 1 to 2 hours after lisinopril dose along with checking in the evening.  Today's BP 182/100 without a.m. antihypertensives.  She does routinely monitor at home with systolics typically [id number].  She will follow-up with PCP in 05/2018 who can further manage hypertension along with medication management 3. HLD: Advised to continue current treatment regimen along with continued follow-up with PCP for future prescribing and monitoring of lipid panel     Follow up in 6 months or call earlier if needed   Greater than 50% of time during  this 32 minute visit was spent on counseling, explanation of diagnosis of right thalamic infarct, reviewing risk factor management of HTN, HLD and DM, discussion regarding continued HTN despite medication compliance, planning of further management along with potential future management, and discussion with patient and family answering all questions.    Frann Rider, AGNP-BC  [facility] [address] [place], [place] [address]  Phone [phone] Fax [phone] Note: This document was prepared with digital dictation and possible smart phrase technology. Any transcriptional errors that result from this process are unintentional.

## 2018-10-13 NOTE — Telephone Encounter (Signed)
Patient was a no call/no show for their appointment today.   

## 2018-11-23 ENCOUNTER — Other Ambulatory Visit: Payer: BLUE CROSS/BLUE SHIELD

## 2018-11-23 ENCOUNTER — Other Ambulatory Visit: Payer: Self-pay

## 2018-11-23 ENCOUNTER — Ambulatory Visit: Payer: BLUE CROSS/BLUE SHIELD

## 2018-11-23 DIAGNOSIS — B20 Human immunodeficiency virus [HIV] disease: Secondary | ICD-10-CM

## 2018-11-23 DIAGNOSIS — Z113 Encounter for screening for infections with a predominantly sexual mode of transmission: Secondary | ICD-10-CM

## 2018-11-23 LAB — CBC WITH DIFFERENTIAL/PLATELET
Absolute Monocytes: 603 cells/uL (ref 200–950)
Basophils Absolute: 10 cells/uL (ref 0–200)
Basophils Relative: 0.2 %
Eosinophils Absolute: 123 cells/uL (ref 15–500)
Eosinophils Relative: 2.5 %
HCT: 38.9 % (ref 35.0–45.0)
Hemoglobin: 13 g/dL (ref 11.7–15.5)
Lymphs Abs: 2087 cells/uL (ref 850–3900)
MCH: 29.3 pg (ref 27.0–33.0)
MCHC: 33.4 g/dL (ref 32.0–36.0)
MCV: 87.8 fL (ref 80.0–100.0)
MPV: 14.3 fL — ABNORMAL HIGH (ref 7.5–12.5)
Monocytes Relative: 12.3 %
Neutro Abs: 2078 cells/uL (ref 1500–7800)
Neutrophils Relative %: 42.4 %
Platelets: 142 10*3/uL (ref 140–400)
RBC: 4.43 10*6/uL (ref 3.80–5.10)
RDW: 12.5 % (ref 11.0–15.0)
Total Lymphocyte: 42.6 %
WBC: 4.9 10*3/uL (ref 3.8–10.8)

## 2018-11-24 ENCOUNTER — Encounter: Payer: Self-pay | Admitting: Infectious Diseases

## 2018-11-24 LAB — T-HELPER CELL (CD4) - (RCID CLINIC ONLY)
CD4 % Helper T Cell: 4 % — ABNORMAL LOW (ref 33–65)
CD4 T Cell Abs: 71 /uL — ABNORMAL LOW (ref 400–1790)

## 2018-11-24 LAB — URINE CYTOLOGY ANCILLARY ONLY
Chlamydia: NEGATIVE
Comment: NEGATIVE
Comment: NORMAL
Neisseria Gonorrhea: NEGATIVE

## 2018-11-24 LAB — RPR: RPR Ser Ql: NONREACTIVE

## 2018-12-01 LAB — COMPREHENSIVE METABOLIC PANEL
AG Ratio: 1 (calc) (ref 1.0–2.5)
ALT: 16 U/L (ref 6–29)
AST: 22 U/L (ref 10–35)
Albumin: 4.5 g/dL (ref 3.6–5.1)
Alkaline phosphatase (APISO): 57 U/L (ref 37–153)
BUN: 13 mg/dL (ref 7–25)
CO2: 28 mmol/L (ref 20–32)
Calcium: 9.9 mg/dL (ref 8.6–10.4)
Chloride: 101 mmol/L (ref 98–110)
Creat: 0.73 mg/dL (ref 0.50–0.99)
Globulin: 4.7 g/dL (calc) — ABNORMAL HIGH (ref 1.9–3.7)
Glucose, Bld: 78 mg/dL (ref 65–99)
Potassium: 4.4 mmol/L (ref 3.5–5.3)
Sodium: 137 mmol/L (ref 135–146)
Total Bilirubin: 0.6 mg/dL (ref 0.2–1.2)
Total Protein: 9.2 g/dL — ABNORMAL HIGH (ref 6.1–8.1)

## 2018-12-01 LAB — HIV-1 RNA QUANT-NO REFLEX-BLD
HIV 1 RNA Quant: 200000 copies/mL — ABNORMAL HIGH
HIV-1 RNA Quant, Log: 5.3 Log copies/mL — ABNORMAL HIGH

## 2018-12-15 ENCOUNTER — Ambulatory Visit (INDEPENDENT_AMBULATORY_CARE_PROVIDER_SITE_OTHER): Payer: BLUE CROSS/BLUE SHIELD | Admitting: Infectious Diseases

## 2018-12-15 ENCOUNTER — Encounter: Payer: Self-pay | Admitting: Infectious Diseases

## 2018-12-15 ENCOUNTER — Other Ambulatory Visit: Payer: Self-pay

## 2018-12-15 DIAGNOSIS — R05 Cough: Secondary | ICD-10-CM

## 2018-12-15 DIAGNOSIS — B20 Human immunodeficiency virus [HIV] disease: Secondary | ICD-10-CM | POA: Diagnosis not present

## 2018-12-15 DIAGNOSIS — R059 Cough, unspecified: Secondary | ICD-10-CM

## 2018-12-15 DIAGNOSIS — Z23 Encounter for immunization: Secondary | ICD-10-CM

## 2018-12-15 MED ORDER — BIKTARVY 50-200-25 MG PO TABS: 1.0000 | ORAL_TABLET | Freq: Every day | ORAL | 2 refills | Status: DC

## 2018-12-15 MED ORDER — SULFAMETHOXAZOLE-TRIMETHOPRIM 800-160 MG PO TABS: 1.0000 | ORAL_TABLET | Freq: Every day | ORAL | 3 refills | Status: DC

## 2018-12-15 NOTE — Progress Notes (Signed)
Name: [name]  DOB: 02/26/1955 MRN: [id number] PCP: Maurice Small, MD    Patient Active Problem List   Diagnosis Date Noted  . AIDS (acquired immune deficiency syndrome) ([place]) 11/30/2017  . CVA (cerebral vascular accident) ([place]) 11/25/2017  . Otitis media 11/25/2017  . Bronchitis 11/25/2017  . Cerebral infarction ([place]) 02/08/2015  . Stroke with cerebral ischemia ([place])   . Stroke ([place]) 12/08/2014  . Left-sided weakness   . HLD (hyperlipidemia)   . Benign essential HTN   . Cerebrovascular accident (CVA) due to occlusion of cerebral artery (HCC)       Subjective:  CC: New patient here to establish care for HIV/AIDS Cough x 2 weeks.    HPI: She is here with her granddaughter to establish care. She can understand a good amount of English and her granddaughter helps to fill in when needed in her dialect. No interpretor from medical system was available at the time of our visit. They both wish to proceed with the visit.   She has been doing OK since she was diagnosed in 11-2017 with HIV in the setting of an acute CVA. No hospitalizations or significant illnesses over the last year. She has not been started on any ARVs to treat her HIV yet due to confusion over appointments in the past. She was married to her husband for many years prior to his death 84 years ago. She has no no sexual activity with other partners since that time. She has been living in the [place] now for 6 years with her daughter/granddaughter.   Coughing that has been ongoing for 2 weeks now. She denies associated   Sleep has been difficult for her lately.   Last pap smear 2017 that was normal cytology and HPV (-).   She has had some trouble with depressed feelings/sadness since her diagnosis but can self-manage. Does not interfere with her life/ADLs.    Review of Systems  Constitutional: Negative for chills, fever, malaise/fatigue and weight loss.  HENT: Negative for sore throat.        No dental problems   Respiratory: Positive for cough. Negative for sputum production.   Cardiovascular: Negative for chest pain and leg swelling.  Gastrointestinal: Negative for abdominal pain, diarrhea and vomiting.  Genitourinary: Negative for dysuria and flank pain.  Musculoskeletal: Negative for joint pain, myalgias and neck pain.  Skin: Negative for rash.  Neurological: Negative for dizziness, tingling and headaches.  Psychiatric/Behavioral: Negative for depression and substance abuse. The patient is not nervous/anxious and does not have insomnia.     Past Medical History:  Diagnosis Date  . Arthritis   . Headache   . Hypertension   . Stroke [facility])     Outpatient Medications Prior to Visit  Medication Sig Dispense Refill  . amoxicillin-clavulanate (AUGMENTIN) 875-125 MG tablet Take 1 tablet by mouth every 12 (twelve) hours. (Patient not taking: Reported on 01/11/2018) 6 tablet 0  . benzonatate (TESSALON) 100 MG capsule Take 1 capsule (100 mg total) by mouth every 8 (eight) hours. 21 capsule 0  . bictegravir-emtricitabine-tenofovir AF (BIKTARVY) 50-200-25 MG TABS tablet Take 1 tablet by mouth daily. 30 tablet 2  . clopidogrel (PLAVIX) 75 MG tablet Take 1 tablet (75 mg total) by mouth daily. 30 tablet 11  . feeding supplement, ENSURE ENLIVE, (ENSURE ENLIVE) LIQD Take 237 mLs by mouth 2 (two) times daily between meals. 237 mL 12  . lisinopril (PRINIVIL,ZESTRIL) 10 MG tablet Take 1 tablet (10 mg total) by mouth daily. 30 tablet  1  . senna-docusate (SENOKOT-S) 8.6-50 MG tablet Take 1 tablet by mouth at bedtime as needed for mild constipation or moderate constipation. 30 tablet 0  . simvastatin (ZOCOR) 40 MG tablet Take 1 tablet (40 mg total) by mouth daily at 6 PM. 30 tablet 0  . sulfamethoxazole-trimethoprim (BACTRIM DS,SEPTRA DS) 800-160 MG tablet Take 1 tablet by mouth daily. (Patient not taking: Reported on 01/11/2018) 30 tablet 3   No facility-administered medications prior to visit.      No Known  Allergies  Social History   Tobacco Use  . Smoking status: Never Smoker  . Smokeless tobacco: Never Used  Substance Use Topics  . Alcohol use: Never    Frequency: Never  . Drug use: Never    No family history on file.  Social History   Substance and Sexual Activity  Sexual Activity Not on file     Objective:  There were no vitals filed for this visit. There is no height or weight on file to calculate BMI.  Physical Exam Vitals reviewed.  Constitutional:      Appearance: She is well-developed.     Comments: Seated comfortably in chair.   HENT:     Mouth/Throat:     Mouth: No oral lesions.     Dentition: Normal dentition. No dental abscesses.     Pharynx: No oropharyngeal exudate.  Cardiovascular:     Rate and Rhythm: Normal rate and regular rhythm.     Heart sounds: Normal heart sounds.  Pulmonary:     Effort: Pulmonary effort is normal.     Breath sounds: Normal breath sounds.  Abdominal:     General: There is no distension.     Palpations: Abdomen is soft.     Tenderness: There is no abdominal tenderness.  Lymphadenopathy:     Cervical: No cervical adenopathy.  Skin:    General: Skin is warm and dry.     Findings: No rash.  Neurological:     Mental Status: She is alert and oriented to person, place, and time.  Psychiatric:        Judgment: Judgment normal.     Comments: In good spirits today and engaged in care discussion     Lab Results Lab Results  Component Value Date   WBC 4.9 11/23/2018   HGB 13.0 11/23/2018   HCT 38.9 11/23/2018   MCV 87.8 11/23/2018   PLT 142 11/23/2018    Lab Results  Component Value Date   CREATININE 0.73 11/23/2018   BUN 13 11/23/2018   NA 137 11/23/2018   K 4.4 11/23/2018   CL 101 11/23/2018   CO2 28 11/23/2018    Lab Results  Component Value Date   ALT 16 11/23/2018   AST 22 11/23/2018   ALKPHOS 56 11/25/2017   BILITOT 0.6 11/23/2018    Lab Results  Component Value Date   CHOL 123 11/26/2017   HDL 31  (L) 11/26/2017   LDLCALC 77 11/26/2017   TRIG 75 11/26/2017   CHOLHDL 4.0 11/26/2017   HIV 1 RNA Quant (copies/mL)  Date Value  11/23/2018 200,000 (H)   CD4 T Cell Abs (/uL)  Date Value  11/23/2018 71 (L)  11/27/2017 60 (L)     Assessment & Plan:   Problem List Items Addressed This Visit    None      [name], MSN, NP-C [facility] Pager: [phone] Office: [phone]  12/15/18  2:03 PM

## 2018-12-15 NOTE — Patient Instructions (Addendum)
For your cough - please try an over the counter allergy pill once   Biktarvy is the pill I would like for you to start taking to treat you - this will need to be taken once a day around the same time.  - Common side effects for a short time frame usually include headaches, nausea and diarrhea - OK to take over the counter tylenol for headaches and imodium for diarrhea - Try taking with food if you are nauseated  -If you take any multivitamins or supplements please separate them from your Biktarvy by 6 hours before and after.  The main thing is do not have them in the stomach at the same time.  Please also start taking Bactrim (trimethoprim-sulfamethoxazole) once a day with your Biktarvy until we tell you to stop. This will protect your immune system until if improves.   Please come back in 4 - 6 weeks to check in again with blood work.   Vaccines given today -   Flu Shot

## 2019-01-19 ENCOUNTER — Other Ambulatory Visit: Payer: Self-pay

## 2019-01-19 ENCOUNTER — Encounter: Payer: Self-pay | Admitting: Infectious Diseases

## 2019-01-19 ENCOUNTER — Ambulatory Visit (INDEPENDENT_AMBULATORY_CARE_PROVIDER_SITE_OTHER): Payer: BLUE CROSS/BLUE SHIELD | Admitting: Infectious Diseases

## 2019-01-19 VITALS — BP 177/84 | HR 77 | Temp 99.1°F | Ht 61.0 in | Wt 130.0 lb

## 2019-01-19 DIAGNOSIS — R05 Cough: Secondary | ICD-10-CM | POA: Insufficient documentation

## 2019-01-19 DIAGNOSIS — Z21 Asymptomatic human immunodeficiency virus [HIV] infection status: Secondary | ICD-10-CM

## 2019-01-19 DIAGNOSIS — R059 Cough, unspecified: Secondary | ICD-10-CM | POA: Insufficient documentation

## 2019-01-19 DIAGNOSIS — B20 Human immunodeficiency virus [HIV] disease: Secondary | ICD-10-CM

## 2019-01-19 DIAGNOSIS — Z23 Encounter for immunization: Secondary | ICD-10-CM

## 2019-01-19 DIAGNOSIS — I63331 Cerebral infarction due to thrombosis of right posterior cerebral artery: Secondary | ICD-10-CM

## 2019-01-19 HISTORY — DX: Asymptomatic human immunodeficiency virus (hiv) infection status: Z21

## 2019-01-19 HISTORY — DX: Human immunodeficiency virus (HIV) disease: B20

## 2019-01-19 MED ORDER — SULFAMETHOXAZOLE-TRIMETHOPRIM 800-160 MG PO TABS: 1.0000 | ORAL_TABLET | Freq: Every day | ORAL | 3 refills | Status: DC

## 2019-01-19 MED ORDER — SIMVASTATIN 40 MG PO TABS: 40.0000 mg | ORAL_TABLET | Freq: Every day | ORAL | 0 refills | Status: DC

## 2019-01-19 MED ORDER — BIKTARVY 50-200-25 MG PO TABS: 1.0000 | ORAL_TABLET | Freq: Every day | ORAL | 2 refills | Status: DC

## 2019-01-19 NOTE — Progress Notes (Signed)
Name: Sandra [name]  DOB: August 15, 1955 MRN: [id number] PCP: Maurice Small, MD    Patient Active Problem List   Diagnosis Date Noted  . Cough 01/19/2019  . HIV (human immunodeficiency virus infection) ([place]) 01/19/2019  . AIDS (acquired immune deficiency syndrome) ([place]) 11/30/2017  . CVA (cerebral vascular accident) ([place]) 11/25/2017  . Otitis media 11/25/2017  . Bronchitis 11/25/2017  . Cerebral infarction ([place]) 02/08/2015  . Stroke with cerebral ischemia ([place])   . Stroke ([place]) 12/08/2014  . Left-sided weakness   . HLD (hyperlipidemia)   . Benign essential HTN   . Cerebrovascular accident (CVA) due to occlusion of cerebral artery (HCC)       Subjective:  CC: New patient here to establish care for HIV/AIDS Cough x 2 weeks.    HPI: She is here with her granddaughter to establish care. She has continued taking her biktarvy and bactrim once a day without any missed doses. No concer for side effect. Has noticed some itchy skin that she associates with the weather lately but has done well otherwise without rashes, diarrhea, headaches or otherwise.   Her cough has improved since last office visit and now no longer bothers her.   She needs a refill of her statin but has plenty of lisinopril.   She does have some left sided knee pain. She has had this pain for a while now. Describes it to be progressive but not worsening; cracking/grinding. Has not tried anything for the pain just deals with it.    Review of Systems  Constitutional: Negative for chills and fever.  HENT: Negative for tinnitus.   Eyes: Negative for blurred vision and photophobia.  Respiratory: Negative for cough and sputum production.   Cardiovascular: Negative for chest pain.  Gastrointestinal: Negative for diarrhea, nausea and vomiting.  Genitourinary: Negative for dysuria.  Musculoskeletal: Positive for joint pain. Negative for back pain and falls.  Skin: Negative for rash.  Neurological: Negative for  headaches.    Past Medical History:  Diagnosis Date  . Arthritis   . Headache   . HIV (human immunodeficiency virus infection) ([place]) 01/19/2019  . Hypertension   . Stroke [facility])     Outpatient Medications Prior to Visit  Medication Sig Dispense Refill  . lisinopril (PRINIVIL,ZESTRIL) 10 MG tablet Take 1 tablet (10 mg total) by mouth daily. 30 tablet 1  . bictegravir-emtricitabine-tenofovir AF (BIKTARVY) 50-200-25 MG TABS tablet Take 1 tablet by mouth daily. 30 tablet 2  . simvastatin (ZOCOR) 40 MG tablet Take 1 tablet (40 mg total) by mouth daily at 6 PM. 30 tablet 0  . sulfamethoxazole-trimethoprim (BACTRIM DS) 800-160 MG tablet Take 1 tablet by mouth daily. 30 tablet 3   No facility-administered medications prior to visit.     No Known Allergies  Social History   Tobacco Use  . Smoking status: Never Smoker  . Smokeless tobacco: Never Used  Substance Use Topics  . Alcohol use: Never  . Drug use: Never    No family history on file.  Social History   Substance and Sexual Activity  Sexual Activity Not on file     Objective:   Vitals:   01/19/19 1558  BP: (!) 177/84  Pulse: 77  Temp: 99.1 F (37.3 C)  TempSrc: Oral  SpO2: 97%  Weight: 130 lb (59 kg)  Height: 5\' 1"  (1.549 m)   Body mass index is 24.56 kg/m.  Physical Exam Vitals reviewed.  Constitutional:      Appearance: She is well-developed.  Comments: Seated comfortably in chair.   HENT:     Mouth/Throat:     Mouth: No oral lesions.     Dentition: Normal dentition. No dental abscesses.     Pharynx: No oropharyngeal exudate.  Cardiovascular:     Rate and Rhythm: Normal rate and regular rhythm.     Heart sounds: Normal heart sounds.  Pulmonary:     Effort: Pulmonary effort is normal.     Breath sounds: Normal breath sounds.  Abdominal:     General: There is no distension.     Palpations: Abdomen is soft.     Tenderness: There is no abdominal tenderness.  Musculoskeletal:     Right knee:  No swelling, deformity or crepitus. Normal range of motion. Normal alignment and normal patellar mobility.     Left knee: Crepitus present. No swelling or deformity. Normal range of motion. Normal alignment and normal patellar mobility.  Lymphadenopathy:     Cervical: No cervical adenopathy.  Skin:    General: Skin is warm and dry.     Findings: No rash.  Neurological:     Mental Status: She is alert and oriented to person, place, and time.  Psychiatric:        Judgment: Judgment normal.     Comments: In good spirits today and engaged in care discussion     Lab Results Lab Results  Component Value Date   WBC 4.9 11/23/2018   HGB 13.0 11/23/2018   HCT 38.9 11/23/2018   MCV 87.8 11/23/2018   PLT 142 11/23/2018    Lab Results  Component Value Date   CREATININE 0.73 11/23/2018   BUN 13 11/23/2018   NA 137 11/23/2018   K 4.4 11/23/2018   CL 101 11/23/2018   CO2 28 11/23/2018    Lab Results  Component Value Date   ALT 16 11/23/2018   AST 22 11/23/2018   ALKPHOS 56 11/25/2017   BILITOT 0.6 11/23/2018    Lab Results  Component Value Date   CHOL 123 11/26/2017   HDL 31 (L) 11/26/2017   LDLCALC 77 11/26/2017   TRIG 75 11/26/2017   CHOLHDL 4.0 11/26/2017   HIV 1 RNA Quant (copies/mL)  Date Value  01/19/2019 299 (H)  11/23/2018 200,000 (H)   CD4 T Cell Abs (/uL)  Date Value  01/19/2019 129 (L)  11/23/2018 71 (L)  11/27/2017 60 (L)     Assessment & Plan:   Problem List Items Addressed This Visit      Unprioritized   HIV (human immunodeficiency virus infection) ([place]) (Chronic)    Doing well on Biktarvy since starting in the hospital. She is taking it correctly and having no side effects.  VL and CD4 today.  RTC in 3 months.   Lab Results  Component Value Date   HIV1RNAQUANT 299 (H) 01/19/2019        Relevant Medications   bictegravir-emtricitabine-tenofovir AF (BIKTARVY) 50-200-25 MG TABS tablet   sulfamethoxazole-trimethoprim (BACTRIM DS) 800-160 MG  tablet   CVA (cerebral vascular accident) [facility])    Provided with information for Neurology follow up appointment - she will have her granddaughter call to help schedule FU following newly diagnosed CVA.       AIDS (acquired immune deficiency syndrome) ([place])    No findings of OI on exam today - will have her continue Bactrim daily x 3 months with reassessment.   Lab Results  Component Value Date   CD4TCELL 6 (L) 01/19/2019   CD4TABS 129 (L) 01/19/2019  Relevant Medications   bictegravir-emtricitabine-tenofovir AF (BIKTARVY) 50-200-25 MG TABS tablet   sulfamethoxazole-trimethoprim (BACTRIM DS) 800-160 MG tablet   Other Relevant Orders   HIV 1 RNA quant-no reflex-bld (Completed)   T-helper cell (CD4)- ([facility] clinic only) (Completed)   3 month T-helper cell (CD4)- ([facility] clinic only)   3 month VL   Prevnar (Completed)    Other Visit Diagnoses    Need for prophylactic vaccination against Streptococcus pneumoniae (pneumococcus)    -  Primary   Relevant Orders   Prevnar (Completed)      [name], MSN, NP-C [place] [facility] [place] Pager: [phone] Office: [phone]  03/25/19  8:50 AM

## 2019-01-19 NOTE — Assessment & Plan Note (Signed)
New patient here to establish for HIV care. Treatment naive with low CD4 < 200.    discussed with David Stall treatment options/side effects, benefits of treatment and long-term outcomes. I discussed how HIV is transmitted and the process of untreated HIV including increased risk for opportunistic infections, cancer, dementia and renal failure. Patient was counseled on routine HIV care including medication adherence, blood monitoring, necessary vaccines and follow up visits. Counseled regarding safe sex practices including: condom use, partner disclosure, limiting partners.   Will start [place] for HIV treatment. She will also begin Bactrim for prophylaxisis given low CD4.  Copay assistance arranged today.   General introduction to our clinic and integrated services. Offered counseling services/support which she will consider but declined for now and THP today. Dental referral placed today for [facility]. Information to schedule appointment completed today.   I spent greater than 45 minutes with the patient today. Greater than 50% of the time spent face-to-face counseling and coordination of care re: HIV and health maintenance.

## 2019-01-19 NOTE — Assessment & Plan Note (Signed)
Acute onset, dry cough that is worse in the evenings. No findings on exam concerning for OI as she is not hypoxic and no fevers. She does have associated nasal congestion and itchy eyes - will treat with once daily antihistamine to see if allergic component and reassess at upcoming visit.

## 2019-01-19 NOTE — Patient Instructions (Addendum)
Please call the neurology team back for an appointment (brain doctor) [facility] [address] [place] [address] [phone]   Please continue taking your Biktarvy (small red pill) and Bactrim (larger white pill) once a day.   Please stop by the lab on your way out   Would like to see you back in 3 months with labs prior to your visit.   We gave you your pneumonia vaccine today and will get you your second dose at your next visit.   Stay safe!

## 2019-01-20 LAB — T-HELPER CELL (CD4) - (RCID CLINIC ONLY)
CD4 % Helper T Cell: 6 % — ABNORMAL LOW (ref 33–65)
CD4 T Cell Abs: 129 /uL — ABNORMAL LOW (ref 400–1790)

## 2019-01-22 LAB — HIV-1 RNA QUANT-NO REFLEX-BLD
HIV 1 RNA Quant: 299 copies/mL — ABNORMAL HIGH
HIV-1 RNA Quant, Log: 2.48 Log copies/mL — ABNORMAL HIGH

## 2019-02-15 ENCOUNTER — Telehealth: Payer: Self-pay

## 2019-02-15 NOTE — Telephone Encounter (Signed)
PA for Biktarvy initiated through covermymeds today. Faxed additional notes. Waiting on PA outcome.  [place]

## 2019-02-15 NOTE — Telephone Encounter (Signed)
PA denied. Will have pharmacy team look into assisting PA determination. [place]

## 2019-02-17 NOTE — Telephone Encounter (Signed)
Submitted appeal to patient's insurance for her [place].

## 2019-02-21 ENCOUNTER — Other Ambulatory Visit: Payer: Self-pay

## 2019-02-21 MED ORDER — SIMVASTATIN 40 MG PO TABS: 40.0000 mg | ORAL_TABLET | Freq: Every day | ORAL | 1 refills | Status: DC

## 2019-02-21 NOTE — Telephone Encounter (Signed)
PA approved for [facility] as of 02/09/19-02/19/20. Pharmacy made aware and will have ready for pick up tomorrow 02/22/19. Patient also made aware with her daughter interpreting via phone.  Sandra [name]

## 2019-03-25 ENCOUNTER — Encounter: Payer: Self-pay | Admitting: Infectious Diseases

## 2019-03-25 NOTE — Assessment & Plan Note (Signed)
No findings of OI on exam today - will have her continue Bactrim daily x 3 months with reassessment.   Lab Results  Component Value Date   CD4TCELL 6 (L) 01/19/2019   CD4TABS 129 (L) 01/19/2019

## 2019-03-25 NOTE — Assessment & Plan Note (Signed)
Provided with information for Neurology follow up appointment - she will have her granddaughter call to help schedule FU following newly diagnosed CVA.

## 2019-03-25 NOTE — Assessment & Plan Note (Signed)
Doing well on Biktarvy since starting in the hospital. She is taking it correctly and having no side effects.  VL and CD4 today.  RTC in 3 months.   Lab Results  Component Value Date   HIV1RNAQUANT 299 (H) 01/19/2019

## 2019-04-19 ENCOUNTER — Other Ambulatory Visit: Payer: Self-pay

## 2019-04-24 ENCOUNTER — Other Ambulatory Visit: Payer: Self-pay | Admitting: Infectious Diseases

## 2019-05-09 ENCOUNTER — Encounter: Payer: Self-pay | Admitting: Infectious Diseases

## 2019-05-16 ENCOUNTER — Ambulatory Visit (INDEPENDENT_AMBULATORY_CARE_PROVIDER_SITE_OTHER): Payer: 59 | Admitting: Infectious Diseases

## 2019-05-16 ENCOUNTER — Encounter: Payer: Self-pay | Admitting: Infectious Diseases

## 2019-05-16 ENCOUNTER — Other Ambulatory Visit: Payer: Self-pay

## 2019-05-16 VITALS — BP 199/82 | HR 65 | Temp 98.6°F | Ht 59.0 in | Wt 136.0 lb

## 2019-05-16 DIAGNOSIS — Z21 Asymptomatic human immunodeficiency virus [HIV] infection status: Secondary | ICD-10-CM | POA: Diagnosis not present

## 2019-05-16 DIAGNOSIS — I10 Essential (primary) hypertension: Secondary | ICD-10-CM | POA: Diagnosis not present

## 2019-05-16 DIAGNOSIS — B20 Human immunodeficiency virus [HIV] disease: Secondary | ICD-10-CM | POA: Diagnosis not present

## 2019-05-16 DIAGNOSIS — G8929 Other chronic pain: Secondary | ICD-10-CM

## 2019-05-16 DIAGNOSIS — M25562 Pain in left knee: Secondary | ICD-10-CM

## 2019-05-16 MED ORDER — BIKTARVY 50-200-25 MG PO TABS: 1.0000 | ORAL_TABLET | Freq: Every day | ORAL | 3 refills | Status: DC

## 2019-05-16 MED ORDER — LISINOPRIL-HYDROCHLOROTHIAZIDE 20-12.5 MG PO TABS: 1.0000 | ORAL_TABLET | Freq: Every day | ORAL | 1 refills | Status: DC

## 2019-05-16 NOTE — Assessment & Plan Note (Signed)
Suspect arthritis, although she does have a small suprapatellar joint effusion today. Will have her start using OTC Diclofenac (would recommend against systemic NSAIDS with her HTN currently) and tylenol for pain control. Ice as she can tolerate esp at night.  Referral to sports medicine.

## 2019-05-16 NOTE — Patient Instructions (Addendum)
Nice to see you today! Please continue taking your Biktarvy every day.   For your knee pain I want you to try over the counter medication called Volteren gel (or Diclofenac gel). This can be put on your left knee 2-3 times a day to help with the inflammation.  I will send in a referral for sports medicine    I will send in a new blood pressure medication for you - this will be one pill once a day to help with your blood pressure. Take it in the morning because it will make you make more urine.   Please call your primary care team to help make further adjustments on your medication to treat your blood pressure.   Return in about 4 months (around 09/16/2019).

## 2019-05-16 NOTE — Progress Notes (Signed)
Name: Sandra [name]  DOB: 1955/08/01 MRN: [id number] PCP: Maurice Small, MD    Patient Active Problem List   Diagnosis Date Noted  . HIV (human immunodeficiency virus infection) ([place]) 01/19/2019    Priority: High  . AIDS (acquired immune deficiency syndrome) ([place]) 11/30/2017    Priority: High  . Chronic pain of left knee 05/16/2019  . CVA (cerebral vascular accident) ([place]) 11/25/2017  . Otitis media 11/25/2017  . Bronchitis 11/25/2017  . Cerebral infarction ([place]) 02/08/2015  . Stroke with cerebral ischemia ([place])   . Stroke ([place]) 12/08/2014  . Left-sided weakness   . HLD (hyperlipidemia)   . Benign essential HTN   . Cerebrovascular accident (CVA) due to occlusion of cerebral artery (HCC)       Subjective:  CC: FU HIV, AIDS(+). L knee pain  Hypertension     HPI: She is here with her granddaughter today with permission to help interpret if needed and discuss all aspects of health.   She has been feeling better since our last office visit. She has had more energy and feels she has had a better appetite. Has noticed some weight gain but unsure how much. She has had only a few missed doses of her Biktarvy from what she recalls since last OV.  She continues to drink Ensure at least once daily.   She is worried about her blood pressure today as it is very high. She did take her lisinopril dose today and usually does not miss taking it. Has not seen her PCP in a while. No headaches, chest pains or shortness of breath.   She has had ongoing knee pain that is unchanged but persistent. States that it is pain not weakness and occurs in all positions. She does not take anything for her knee pain. It is mostly described to be an ache, not sharp or stabbing. No swelling but does have popping/grinding sounds she notices with flexion/extension.    BP Readings from Last 3 Encounters:  05/16/19 (!) 199/82  01/19/19 (!) 177/84  04/12/18 (!) 182/100     Review of Systems    Constitutional: Negative for chills and fever.  HENT: Negative for tinnitus.   Eyes: Negative for blurred vision and photophobia.  Respiratory: Negative for cough and sputum production.   Cardiovascular: Negative for chest pain.  Gastrointestinal: Negative for diarrhea, nausea and vomiting.  Genitourinary: Negative for dysuria.  Musculoskeletal: Positive for joint pain. Negative for back pain and falls.  Skin: Negative for rash.  Neurological: Negative for headaches.    Past Medical History:  Diagnosis Date  . Arthritis   . Headache   . HIV (human immunodeficiency virus infection) ([place]) 01/19/2019  . Hypertension   . Stroke [facility])     Outpatient Medications Prior to Visit  Medication Sig Dispense Refill  . simvastatin (ZOCOR) 40 MG tablet TAKE 1 TABLET(40 MG) BY MOUTH DAILY AT 6 PM 30 tablet 3  . sulfamethoxazole-trimethoprim (BACTRIM DS) 800-160 MG tablet Take 1 tablet by mouth daily. 30 tablet 3  . bictegravir-emtricitabine-tenofovir AF (BIKTARVY) 50-200-25 MG TABS tablet Take 1 tablet by mouth daily. 30 tablet 2  . lisinopril (PRINIVIL,ZESTRIL) 10 MG tablet Take 1 tablet (10 mg total) by mouth daily. 30 tablet 1   No facility-administered medications prior to visit.     No Known Allergies  Social History   Tobacco Use  . Smoking status: Never Smoker  . Smokeless tobacco: Never Used  Substance Use Topics  . Alcohol use: Never  . Drug  use: Never    History reviewed. No pertinent family history.  Social History   Substance and Sexual Activity  Sexual Activity Not on file     Objective:   Vitals:   05/16/19 1006  BP: (!) 199/82  Pulse: 65  Temp: 98.6 F (37 C)  SpO2: 99%  Weight: 136 lb (61.7 kg)  Height: 4\' 11"  (1.499 m)   Body mass index is 27.47 kg/m.  Physical Exam Vitals reviewed.  Constitutional:      Appearance: She is well-developed.     Comments: Seated comfortably in chair.   HENT:     Mouth/Throat:     Mouth: No oral lesions.      Dentition: Normal dentition. No dental abscesses.     Pharynx: No oropharyngeal exudate.  Cardiovascular:     Rate and Rhythm: Normal rate and regular rhythm.     Heart sounds: Normal heart sounds.  Pulmonary:     Effort: Pulmonary effort is normal.     Breath sounds: Normal breath sounds.  Abdominal:     General: There is no distension.     Palpations: Abdomen is soft.     Tenderness: There is no abdominal tenderness.  Musculoskeletal:     Right knee: No swelling, deformity or crepitus. Normal range of motion. Normal alignment and normal patellar mobility.     Left knee: Swelling and crepitus present. No deformity. Normal range of motion. Normal alignment and normal patellar mobility.  Lymphadenopathy:     Cervical: No cervical adenopathy.  Skin:    General: Skin is warm and dry.     Findings: No rash.  Neurological:     Mental Status: She is alert and oriented to person, place, and time.  Psychiatric:        Judgment: Judgment normal.     Comments: In good spirits today and engaged in care discussion     Lab Results Lab Results  Component Value Date   WBC 4.9 11/23/2018   HGB 13.0 11/23/2018   HCT 38.9 11/23/2018   MCV 87.8 11/23/2018   PLT 142 11/23/2018    Lab Results  Component Value Date   CREATININE 0.73 11/23/2018   BUN 13 11/23/2018   NA 137 11/23/2018   K 4.4 11/23/2018   CL 101 11/23/2018   CO2 28 11/23/2018    Lab Results  Component Value Date   ALT 16 11/23/2018   AST 22 11/23/2018   ALKPHOS 56 11/25/2017   BILITOT 0.6 11/23/2018    Lab Results  Component Value Date   CHOL 123 11/26/2017   HDL 31 (L) 11/26/2017   LDLCALC 77 11/26/2017   TRIG 75 11/26/2017   CHOLHDL 4.0 11/26/2017   HIV 1 RNA Quant (copies/mL)  Date Value  01/19/2019 299 (H)  11/23/2018 200,000 (H)   CD4 T Cell Abs (/uL)  Date Value  01/19/2019 129 (L)  11/23/2018 71 (L)  11/27/2017 60 (L)     Assessment & Plan:   Problem List Items Addressed This Visit       High   HIV (human immunodeficiency virus infection) ([place]) (Chronic)    We reviewed previous labs today and how we will monitor infection. She had a good drop in viral load initialy from 200,000 to 299 copies and CD4 recovery from 60 to 160; will update pertinent labs today. It sounds like she stopped taking her Bactrim at some point (I think it was causing some itching). Will see where her CD4 and VL are  and make further determinations.  Return in about 4 months (around 09/16/2019).       Relevant Medications   bictegravir-emtricitabine-tenofovir AF (BIKTARVY) 50-200-25 MG TABS tablet   AIDS (acquired immune deficiency syndrome) (HCC) - Primary    No findings of OIs on exam.       Relevant Medications   bictegravir-emtricitabine-tenofovir AF (BIKTARVY) 50-200-25 MG TABS tablet   Other Relevant Orders   HIV-1 RNA quant-no reflex-bld   T-helper cell (CD4)- ([facility] clinic only)   COMPLETE METABOLIC PANEL WITH GFR     Unprioritized   Benign essential HTN    Uncontrolled - will increase lisinopril and add HCTZ to her regimen. CMP today. Counseled to take in AM to avoid PM bathroom trips. I asked her to please schedule an appointment with her PCP in about a month to follow up on these changes and see what further titration is needed.  She has a history of CVA and we discussed this is necessary to avoid another event. She is taking her statin still.       Relevant Medications   lisinopril-hydrochlorothiazide (ZESTORETIC) 20-12.5 MG tablet   Chronic pain of left knee    Suspect arthritis, although she does have a small suprapatellar joint effusion today. Will have her start using OTC Diclofenac (would recommend against systemic NSAIDS with her HTN currently) and tylenol for pain control. Ice as she can tolerate esp at night.  Referral to sports medicine.       Relevant Orders   AMB referral to sports medicine      Return in about 4 months (around 09/16/2019).    [name], MSN,  NP-C [facility] [place] Pager: [phone] Office: [phone]  05/16/19  1:17 PM

## 2019-05-16 NOTE — Assessment & Plan Note (Signed)
Uncontrolled - will increase lisinopril and add HCTZ to her regimen. CMP today. Counseled to take in AM to avoid PM bathroom trips. I asked her to please schedule an appointment with her PCP in about a month to follow up on these changes and see what further titration is needed.  She has a history of CVA and we discussed this is necessary to avoid another event. She is taking her statin still.

## 2019-05-16 NOTE — Assessment & Plan Note (Signed)
No findings of OIs on exam.

## 2019-05-16 NOTE — Assessment & Plan Note (Signed)
We reviewed previous labs today and how we will monitor infection. She had a good drop in viral load initialy from 200,000 to 299 copies and CD4 recovery from 60 to 160; will update pertinent labs today. It sounds like she stopped taking her Bactrim at some point (I think it was causing some itching). Will see where her CD4 and VL are and make further determinations.  Return in about 4 months (around 09/16/2019).

## 2019-05-17 LAB — T-HELPER CELL (CD4) - (RCID CLINIC ONLY)
CD4 % Helper T Cell: 5 % — ABNORMAL LOW (ref 33–65)
CD4 T Cell Abs: 141 /uL — ABNORMAL LOW (ref 400–1790)

## 2019-05-17 MED ORDER — SULFAMETHOXAZOLE-TRIMETHOPRIM 800-160 MG PO TABS: 1.0000 | ORAL_TABLET | Freq: Every day | ORAL | 4 refills | Status: DC

## 2019-05-17 NOTE — Progress Notes (Signed)
Will resume Bactrim for prophylaxis another 3 months until next visit

## 2019-05-17 NOTE — Addendum Note (Signed)
Addended by: Maud Callas on: 05/17/2019 11:39 AM   Modules accepted: Orders

## 2019-05-18 ENCOUNTER — Ambulatory Visit (INDEPENDENT_AMBULATORY_CARE_PROVIDER_SITE_OTHER): Payer: 59

## 2019-05-18 ENCOUNTER — Ambulatory Visit: Payer: Self-pay

## 2019-05-18 ENCOUNTER — Encounter: Payer: Self-pay | Admitting: Family Medicine

## 2019-05-18 ENCOUNTER — Other Ambulatory Visit: Payer: Self-pay

## 2019-05-18 ENCOUNTER — Ambulatory Visit (INDEPENDENT_AMBULATORY_CARE_PROVIDER_SITE_OTHER): Payer: 59 | Admitting: Family Medicine

## 2019-05-18 VITALS — BP 146/88 | HR 34 | Ht 59.0 in | Wt 137.6 lb

## 2019-05-18 DIAGNOSIS — G8929 Other chronic pain: Secondary | ICD-10-CM | POA: Diagnosis not present

## 2019-05-18 DIAGNOSIS — M25562 Pain in left knee: Secondary | ICD-10-CM | POA: Diagnosis not present

## 2019-05-18 LAB — HIV-1 RNA QUANT-NO REFLEX-BLD
HIV 1 RNA Quant: 63 copies/mL — ABNORMAL HIGH
HIV-1 RNA Quant, Log: 1.8 Log copies/mL — ABNORMAL HIGH

## 2019-05-18 LAB — COMPLETE METABOLIC PANEL WITH GFR
AG Ratio: 1 (calc) (ref 1.0–2.5)
ALT: 16 U/L (ref 6–29)
AST: 20 U/L (ref 10–35)
Albumin: 4.2 g/dL (ref 3.6–5.1)
Alkaline phosphatase (APISO): 58 U/L (ref 37–153)
BUN: 14 mg/dL (ref 7–25)
CO2: 28 mmol/L (ref 20–32)
Calcium: 10 mg/dL (ref 8.6–10.4)
Chloride: 101 mmol/L (ref 98–110)
Creat: 0.9 mg/dL (ref 0.50–0.99)
GFR, Est African American: 78 mL/min/{1.73_m2} (ref >=60)
GFR, Est Non African American: 68 mL/min/{1.73_m2} (ref >=60)
Globulin: 4.3 g/dL (calc) — ABNORMAL HIGH (ref 1.9–3.7)
Glucose, Bld: 99 mg/dL (ref 65–99)
Potassium: 4.1 mmol/L (ref 3.5–5.3)
Sodium: 138 mmol/L (ref 135–146)
Total Bilirubin: 0.8 mg/dL (ref 0.2–1.2)
Total Protein: 8.5 g/dL — ABNORMAL HIGH (ref 6.1–8.1)

## 2019-05-18 NOTE — Patient Instructions (Addendum)
Thank you for coming in today. Get xray today.   We will call the house and ask to speak with Sandra [name] to give results.   Use the voltaren gel over the counter up to 4x daily.   Ok to return as needed for other treatment including injection.

## 2019-05-18 NOTE — Progress Notes (Signed)
137.6  Subjective:    I'm seeing this patient as a consultation for:  [name], NP. Note will be routed back to referring provider/PCP.  CC: L knee pain  I, Molly Weber, LAT, ATC, am serving as scribe for Dr. Lynne Leader.  HPI: Pt is a 64 y/o female presenting w/ c/o chronic L knee pain since suffering a series of strokes per pt and son's report.  She locates her pain to her R ant knee.  She rates her pain as medium.  She is tried some Tylenol and NSAIDs for pain which have helped.  She was recommended to use Voltaren gel by her infectious disease doctor but has not yet started it.  Overall she feels pretty well.  Radiating pain: No L knee swelling: No L knee mechanical symptoms: some intermittently Aggravating factors: prolonged standing Treatments tried: PT after a mild stroke; Voltaren gel  Past medical history, Surgical history, Family history, Social history, Allergies, and medications have been entered into the medical record, reviewed. Medical history significant for stroke, hypertension, and HIV.  Medical social: Patient is an immigrant from [place].  She speaks TWI dialect of [place]  Review of Systems: No new headache, visual changes, nausea, vomiting, diarrhea, constipation, dizziness, abdominal pain, skin rash, fevers, chills, night sweats, weight loss, swollen lymph nodes, body aches, joint swelling, muscle aches, chest pain, shortness of breath, mood changes, visual or auditory hallucinations.   Objective:    Vitals:   05/18/19 1335  BP: (!) 146/88  Pulse: (!) 34  SpO2: 98%   General: Well Developed, well nourished, and in no acute distress.  Neuro/Psych: Alert and oriented x3, extra-ocular muscles intact, able to move all 4 extremities, sensation grossly intact. Skin: Warm and dry, no rashes noted.  Respiratory: Not using accessory muscles, speaking in full sentences, trachea midline.  Cardiovascular: Pulses palpable, no extremity edema. Abdomen: Does not  appear distended. MSK: Left knee normal-appearing without significant effusion. Range of motion 0-120 degrees with retropatellar crepitation. Nontender. Stable ligamentous exam. Intact strength. Nonantalgic gait.  Lab and Radiology Results:  X-ray images left knee obtained today personally and independently reviewed No DJD.  No fractures. Await formal radiology review  Diagnostic Limited MSK Ultrasound of: Left knee Quad tendon intact normal-appearing No significant joint effusion. Patellar tendon normal-appearing Medial joint line degenerative narrowed with partially extruded medial meniscus. Lateral joint line largely normal-appearing No Baker's cyst present posterior knee. Impression: Medial compartment DJD    Results for orders placed or performed in visit on 05/16/19 (from the past 72 hour(s))  T-helper cell (CD4)- ([facility] clinic only)     Status: Abnormal   Collection Time: 05/16/19 10:36 AM  Result Value Ref Range   CD4 T Cell Abs 141 (L) 400 - 1,790 /uL   CD4 % Helper T Cell 5 (L) 33 - 65 %    Comment: Performed at [facility], [place] [address] [place], [place] [address]  COMPLETE METABOLIC PANEL WITH GFR     Status: Abnormal   Collection Time: 05/16/19 10:55 AM  Result Value Ref Range   Glucose, Bld 99 65 - 99 mg/dL    Comment: .            Fasting reference interval .    BUN 14 7 - 25 mg/dL   Creat 0.90 0.50 - 0.99 mg/dL    Comment: For patients >56 years of age, the reference limit for Creatinine is approximately 13% higher for people identified as African-American. Marland Kitchen  GFR, Est Non African American 68 > OR = 60 mL/min/1.90m2   GFR, Est African American 78 > OR = 60 mL/min/1.87m2   BUN/Creatinine Ratio NOT APPLICABLE 6 - 22 (calc)   Sodium 138 135 - 146 mmol/L   Potassium 4.1 3.5 - 5.3 mmol/L   Chloride 101 98 - 110 mmol/L   CO2 28 20 - 32 mmol/L   Calcium 10.0 8.6 - 10.4 mg/dL   Total Protein 8.5 (H) 6.1 - 8.1 g/dL   Albumin 4.2  3.6 - 5.1 g/dL   Globulin 4.3 (H) 1.9 - 3.7 g/dL (calc)   AG Ratio 1.0 1.0 - 2.5 (calc)   Total Bilirubin 0.8 0.2 - 1.2 mg/dL   Alkaline phosphatase (APISO) 58 37 - 153 U/L   AST 20 10 - 35 U/L   ALT 16 6 - 29 U/L   No results found.  Impression and Recommendations:    Assessment and Plan: 64 y.o. female with chronic left knee pain.  Due to DJD.  Discussed diagnosis plan and options with patient and her family.  Plan to continue Tylenol for pain as needed.  Recommend against oral NSAIDs due to hypertension and stroke history.  Additionally strongly recommend topical Voltaren gel as this likely will be beneficial and is unlikely to cause harm. Discussed additionally importance of avoiding weight gain and working on quad strengthening. Discussed my potential interventional options.  First choice would probably be steroid injection however given that her pain is not very bad and not significantly affecting her quality of life I think it is reasonable to hold off on steroid injection at this point.  She has greater risk for side effects or adverse effects from steroid injection including worsening HIV or CVD risk of this procedure.  Although I think the risks are pretty small and certainly reasonable to do in the future if needed would like to hold off on steroid injection. Could also proceed with hyaluronic acid injection in future as well.  Plan for x-ray today.  Radiology read is still pending.  Recheck with me as needed..   Orders Placed This Encounter  Procedures  . [place] LIMITED JOINT SPACE STRUCTURES LOW LEFT(NO LINKED CHARGES)    Order Specific Question:   Reason for Exam (SYMPTOM  OR DIAGNOSIS REQUIRED)    Answer:   L knee pain    Order Specific Question:   Preferred imaging location?    Answer:   [facility]  . DG Knee AP/LAT W/Sun[place] Left    Standing Status:   Future    Standing Expiration Date:   07/17/2020    Order Specific Question:   Reason for Exam  (SYMPTOM  OR DIAGNOSIS REQUIRED)    Answer:   eval knee pain    Order Specific Question:   Preferred imaging location?    Answer:   [facility]    Order Specific Question:   Radiology Contrast Protocol - do NOT remove file path    Answer:   [email or web address]   No orders of the defined types were placed in this encounter.   Discussed warning signs or symptoms. Please see discharge instructions. Patient expresses understanding.   The above documentation has been reviewed and is accurate and complete Lynne Leader

## 2019-05-19 NOTE — Progress Notes (Signed)
Knee x-ray shows medium arthritis in the knee which is probably causing the pain. Call the main number ([phone]) and give results to Sharren Bridge who will act as interpreter per discussion with family during visit on 5/12

## 2019-06-14 IMAGING — MG DIGITAL SCREENING BILATERAL MAMMOGRAM WITH TOMO AND CAD
8 series · 8 of 24 positions shown · non-contrast
Comparison: Previous exam(s).

CLINICAL DATA: Screening.

EXAM:
DIGITAL SCREENING BILATERAL MAMMOGRAM WITH TOMO AND CAD

[L CC synth-2D]
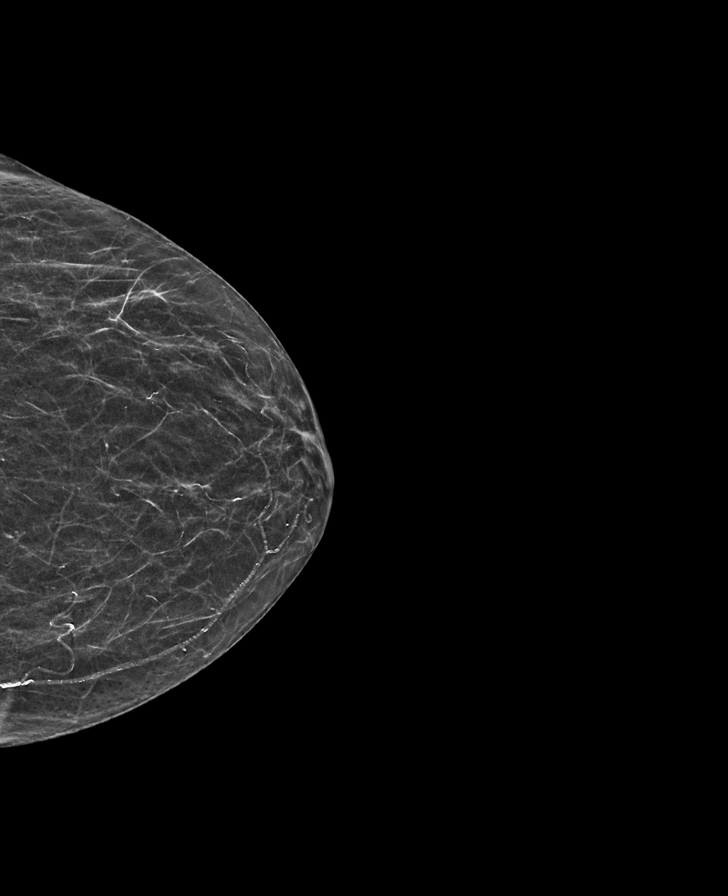

[R MLO synth-2D]
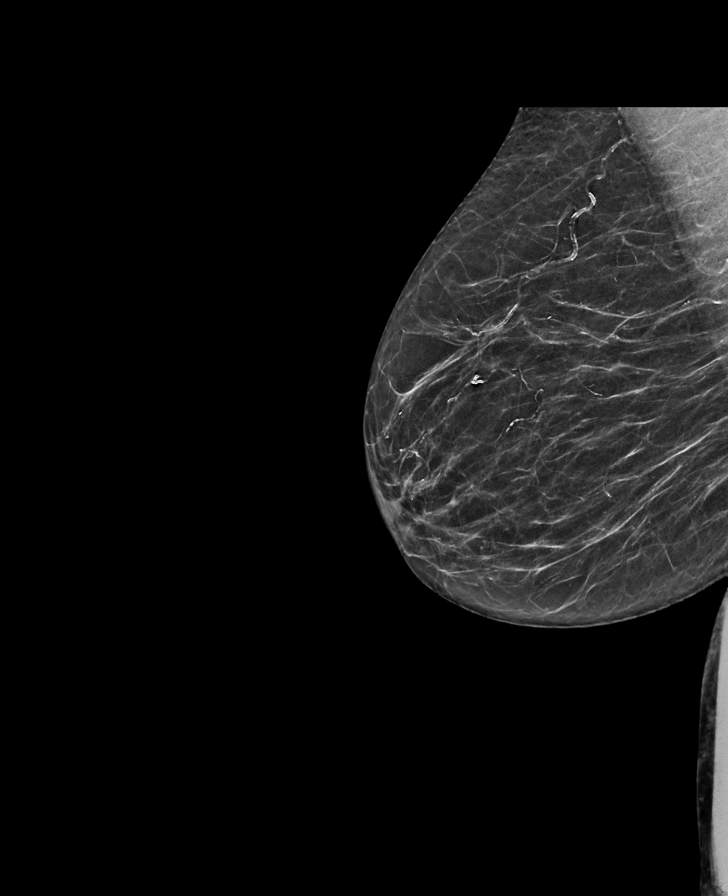

[L MLO synth-2D]
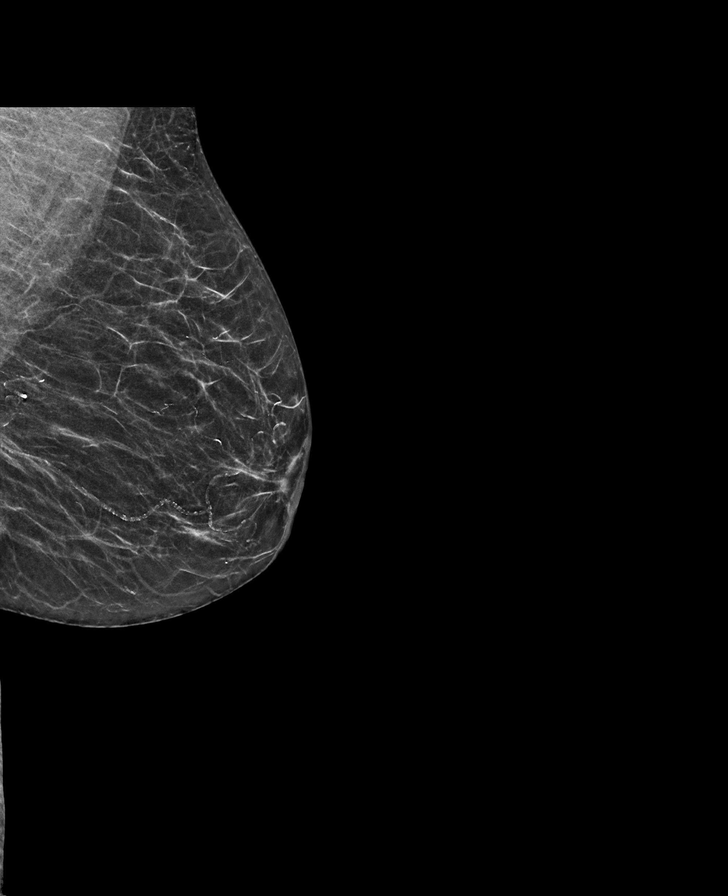

[R CC synth-2D]
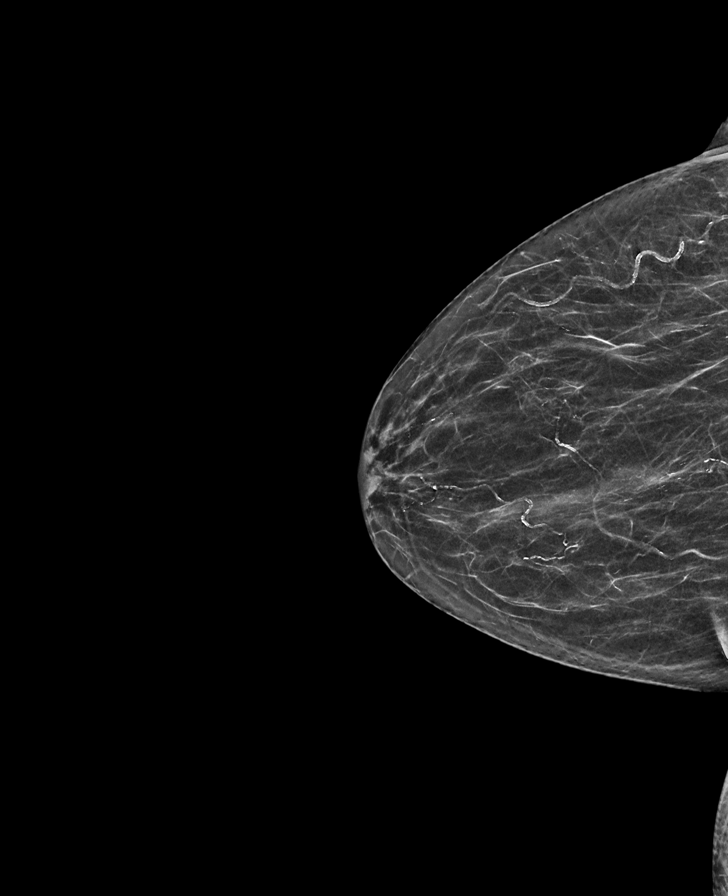

[R MLO tomo · tomo slice 27/54.0]
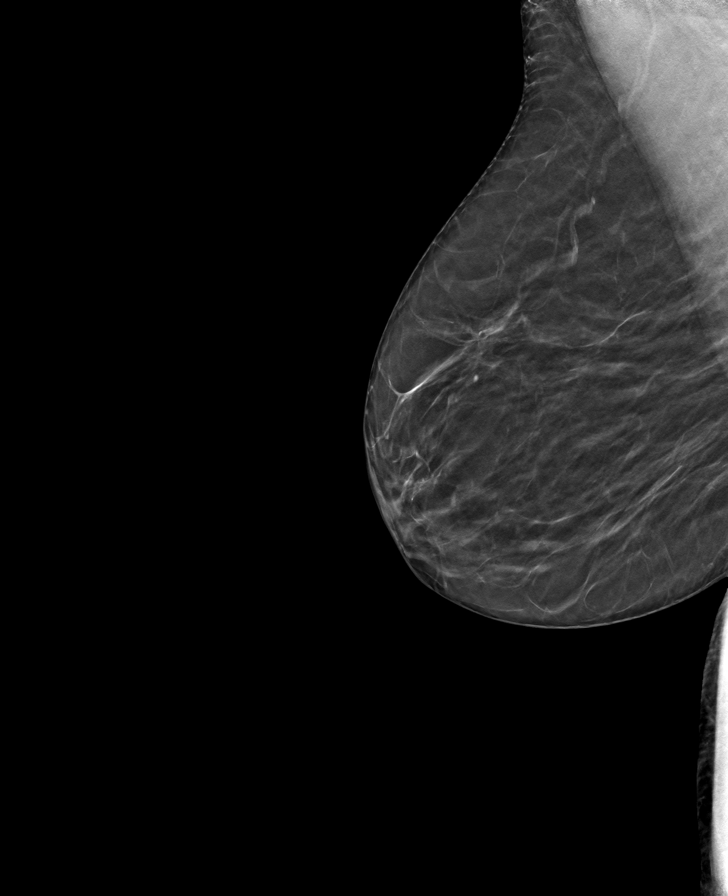

[R CC tomo · tomo slice 27/52.0]
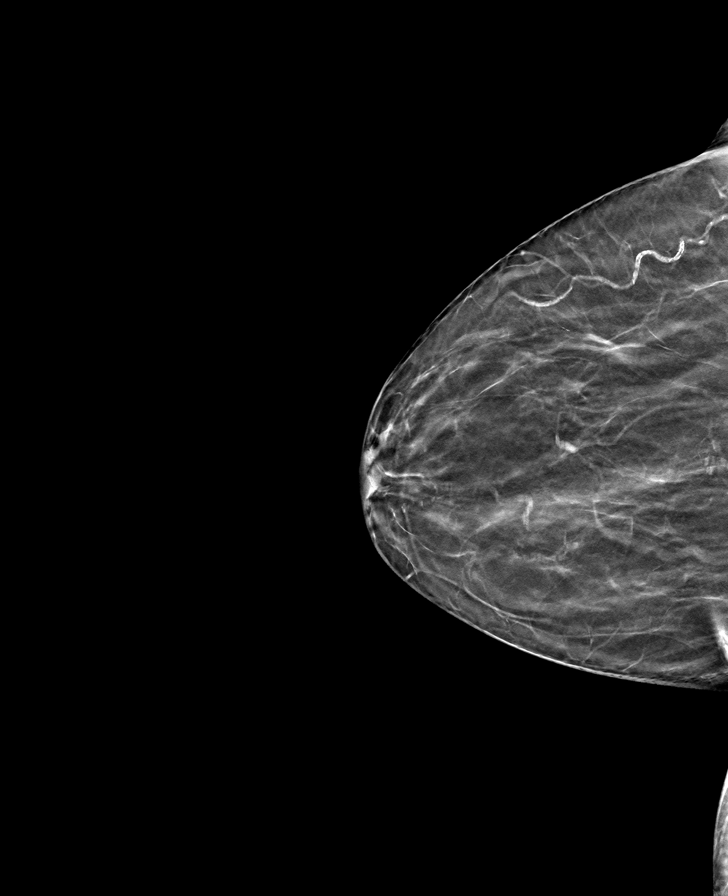

[L MLO tomo · tomo slice 28/55.0]
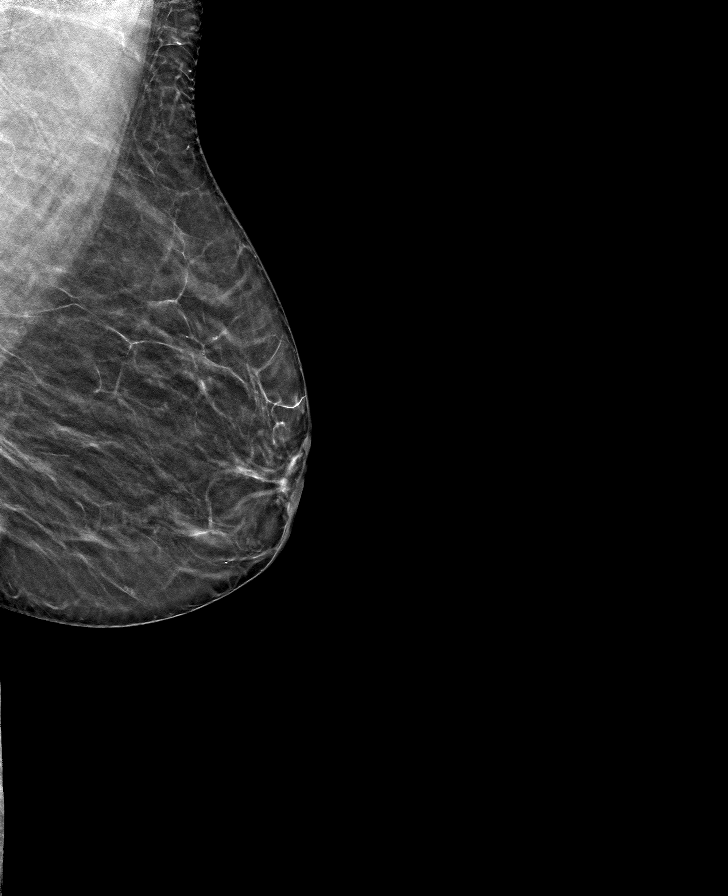

[L CC tomo · tomo slice 23/46.0]
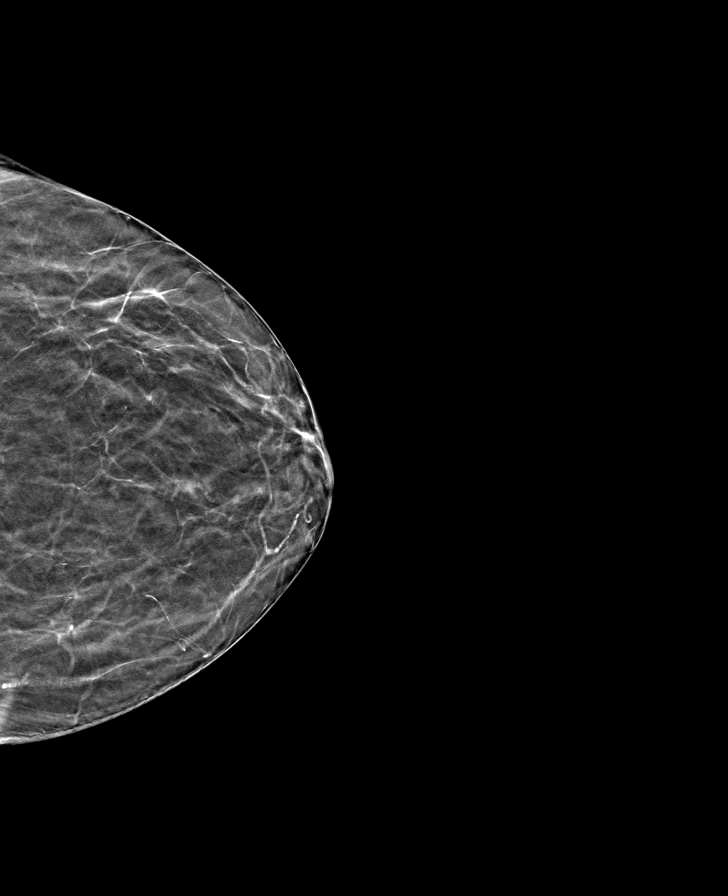

[8 of 24 positions shown; findings below may reference images not displayed]

ACR Breast Density Category b: There are scattered areas of
fibroglandular density.
FINDINGS: In the right breast, calcifications warrant further evaluation with
magnified views. In the left breast, no findings suspicious for
malignancy. Images were processed with CAD.
IMPRESSION: Further evaluation is suggested for calcifications in the right
breast.

RECOMMENDATION:
Diagnostic mammogram of the right breast. (Code:[id number])

The patient will be contacted regarding the findings, and additional
imaging will be scheduled.

BI-RADS CATEGORY  0: Incomplete. Need additional imaging evaluation
and/or prior mammograms for comparison.

## 2019-07-14 ENCOUNTER — Other Ambulatory Visit: Payer: Self-pay | Admitting: Infectious Diseases

## 2019-08-17 ENCOUNTER — Other Ambulatory Visit: Payer: Self-pay

## 2019-09-27 ENCOUNTER — Ambulatory Visit: Payer: 59

## 2019-09-27 ENCOUNTER — Other Ambulatory Visit: Payer: Self-pay

## 2019-09-27 ENCOUNTER — Ambulatory Visit (INDEPENDENT_AMBULATORY_CARE_PROVIDER_SITE_OTHER): Payer: 59 | Admitting: Infectious Diseases

## 2019-09-27 ENCOUNTER — Encounter: Payer: Self-pay | Admitting: Infectious Diseases

## 2019-09-27 VITALS — BP 178/92 | HR 72 | Temp 98.1°F | Ht 61.0 in | Wt 136.0 lb

## 2019-09-27 DIAGNOSIS — Z1211 Encounter for screening for malignant neoplasm of colon: Secondary | ICD-10-CM | POA: Diagnosis not present

## 2019-09-27 DIAGNOSIS — Z23 Encounter for immunization: Secondary | ICD-10-CM | POA: Diagnosis not present

## 2019-09-27 DIAGNOSIS — Z21 Asymptomatic human immunodeficiency virus [HIV] infection status: Secondary | ICD-10-CM | POA: Diagnosis not present

## 2019-09-27 DIAGNOSIS — B20 Human immunodeficiency virus [HIV] disease: Secondary | ICD-10-CM | POA: Diagnosis not present

## 2019-09-27 DIAGNOSIS — I10 Essential (primary) hypertension: Secondary | ICD-10-CM

## 2019-09-27 MED ORDER — BIKTARVY 50-200-25 MG PO TABS: 1.0000 | ORAL_TABLET | Freq: Every day | ORAL | 11 refills | Status: DC

## 2019-09-27 MED ORDER — AMLODIPINE BESYLATE 5 MG PO TABS
5.0000 mg | ORAL_TABLET | Freq: Every day | ORAL | 3 refills | Status: DC
Start: 2019-09-27 — End: 2020-02-14

## 2019-09-27 NOTE — Progress Notes (Signed)
Name: Sandra [name]  DOB: 1955-11-02 MRN: [id number] PCP: Sandra Small, MD    Patient Active Problem List   Diagnosis Date Noted  . HIV (human immunodeficiency virus infection) ([place]) 01/19/2019    Priority: High  . AIDS (acquired immune deficiency syndrome) ([place]) 11/30/2017    Priority: High  . Chronic pain of left knee 05/16/2019  . CVA (cerebral vascular accident) ([place]) 11/25/2017  . Bronchitis 11/25/2017  . Cerebral infarction ([place]) 02/08/2015  . Stroke with cerebral ischemia ([place])   . Stroke ([place]) 12/08/2014  . Left-sided weakness   . HLD (hyperlipidemia)   . Benign essential HTN   . Cerebrovascular accident (CVA) due to occlusion of cerebral artery [facility])       Subjective:   Chief Complaint  Patient presents with  . Follow-up    no concerns      HPI: She is here with her granddaughter today with permission to help interpret if needed and discuss all aspects of health.   Sandra [name] continues on all of her medications including once daily Biktarvy.  She reports no missed doses to her knowledge.  Tries to take it at the same time every day but not always possible.  She continues her lisinopril HCTZ combination of blood pressure pill once a day.  She does not feel like it is working as her blood pressures are always high.  She has not yet had her flu shot.  Her main concern today is her blood pressure given stroke history.  She has not seen her PCP in a while.  She is fully vaccinated against Covid.    BP Readings from Last 3 Encounters:  09/27/19 (!) 178/92  05/18/19 (!) 146/88  05/16/19 (!) 199/82     Review of Systems  Constitutional: Negative for chills and fever.  HENT: Negative for tinnitus.   Eyes: Negative for blurred vision and photophobia.  Respiratory: Negative for cough and sputum production.   Cardiovascular: Negative for chest pain.  Gastrointestinal: Negative for diarrhea, nausea and vomiting.  Genitourinary: Negative for dysuria.  Musculoskeletal:  Positive for joint pain. Negative for back pain and falls.  Skin: Negative for rash.  Neurological: Negative for headaches.     Past Medical History:  Diagnosis Date  . Arthritis   . Headache   . HIV (human immunodeficiency virus infection) ([place]) 01/19/2019  . Hypertension   . Stroke [facility])     Outpatient Medications Prior to Visit  Medication Sig Dispense Refill  . lisinopril-hydrochlorothiazide (ZESTORETIC) 20-12.5 MG tablet TAKE 1 TABLET BY MOUTH DAILY 30 tablet 3  . simvastatin (ZOCOR) 40 MG tablet TAKE 1 TABLET(40 MG) BY MOUTH DAILY AT 6 PM 30 tablet 3  . sulfamethoxazole-trimethoprim (BACTRIM DS) 800-160 MG tablet Take 1 tablet by mouth daily. 30 tablet 4  . bictegravir-emtricitabine-tenofovir AF (BIKTARVY) 50-200-25 MG TABS tablet Take 1 tablet by mouth daily. 30 tablet 3   No facility-administered medications prior to visit.     No Known Allergies  Social History   Tobacco Use  . Smoking status: Never Smoker  . Smokeless tobacco: Never Used  Vaping Use  . Vaping Use: Never used  Substance Use Topics  . Alcohol use: Never  . Drug use: Never     Social History   Substance and Sexual Activity  Sexual Activity Not on file     Objective:   Vitals:   09/27/19 1040  BP: (!) 178/92  Pulse: 72  Temp: 98.1 F (36.7 C)  TempSrc: Oral  SpO2: 100%  Weight: 136 lb (61.7 kg)  Height: 5\' 1"  (1.549 m)   Body mass index is 25.7 kg/m.  Physical Exam Constitutional:      Appearance: She is well-developed.     Comments: Seated comfortably in chair.   HENT:     Mouth/Throat:     Mouth: No oral lesions.     Dentition: Normal dentition. No dental abscesses.     Pharynx: No oropharyngeal exudate.  Cardiovascular:     Rate and Rhythm: Normal rate and regular rhythm.     Heart sounds: Normal heart sounds.  Pulmonary:     Effort: Pulmonary effort is normal.     Breath sounds: Normal breath sounds.  Abdominal:     General: There is no distension.      Palpations: Abdomen is soft.     Tenderness: There is no abdominal tenderness.  Lymphadenopathy:     Cervical: No cervical adenopathy.  Skin:    General: Skin is warm and dry.     Findings: No rash.  Neurological:     Mental Status: She is alert and oriented to person, place, and time.  Psychiatric:        Judgment: Judgment normal.     Comments: In good spirits today and engaged in care discussion     Lab Results Lab Results  Component Value Date   WBC 4.9 11/23/2018   HGB 13.0 11/23/2018   HCT 38.9 11/23/2018   MCV 87.8 11/23/2018   PLT 142 11/23/2018    Lab Results  Component Value Date   CREATININE 0.90 05/16/2019   BUN 14 05/16/2019   NA 138 05/16/2019   K 4.1 05/16/2019   CL 101 05/16/2019   CO2 28 05/16/2019    Lab Results  Component Value Date   ALT 16 05/16/2019   AST 20 05/16/2019   ALKPHOS 56 11/25/2017   BILITOT 0.8 05/16/2019    Lab Results  Component Value Date   CHOL 123 11/26/2017   HDL 31 (L) 11/26/2017   LDLCALC 77 11/26/2017   TRIG 75 11/26/2017   CHOLHDL 4.0 11/26/2017   HIV 1 RNA Quant  Date Value  09/27/2019 125 Copies/mL (H)  05/16/2019 63 copies/mL (H)  01/19/2019 299 copies/mL (H)   CD4 T Cell Abs (/uL)  Date Value  09/27/2019 118 (L)  05/16/2019 141 (L)  01/19/2019 129 (L)     Assessment & Plan:   Problem List Items Addressed This Visit      High   HIV (human immunodeficiency virus infection) ([place]) (Chronic)    She seems to be doing well taking her Biktarvy once a day.  While not perfectly undetectable she is virologically suppressed.  We discussed the importance of trying to keep it at the same time.  I do not identify any drug interactions.  She is vaccinated against Covid.  I will give her her flu shot today.  We will set her up for a Pap smear at the upcoming office visit for routine health screening.  We will update pertinent labs today and have her return in 4 months for regular follow-up.      Relevant Medications    bictegravir-emtricitabine-tenofovir AF (BIKTARVY) 50-200-25 MG TABS tablet   AIDS (acquired immune deficiency syndrome) ([facility])    Her CD4 count continues to be slow to recover.  We discussed today that that is not unexpected given the duration of time she spent not on treatment.  We will continue the Bactrim once a day for now.  No findings on exam or history concerning for opportunistic process.  If she does not come up above 200 cells at our next routine office visit on how ahead and vaccinate against pneumonia, knowing that she will not have a perfect response.  I do think she should get the Covid booster once Moderna has recommendations available.      Relevant Medications   bictegravir-emtricitabine-tenofovir AF (BIKTARVY) 50-200-25 MG TABS tablet   Other Relevant Orders   HIV-1 RNA quant-no reflex-bld (Completed)   T-helper cell (CD4)- ([facility] clinic only) (Completed)   HIV-1 RNA quant-no reflex-bld   T-helper cell (CD4)- ([facility] clinic only)   CBC with Differential/Platelet   COMPLETE METABOLIC PANEL WITH GFR   Lipid panel   RPR     Unprioritized   Benign essential HTN    I will add amlodipine to current regimen once daily.  I asked her to please make an appointment with Dr. Justin Mend to follow-up and titrate medications further.  Would really like to see her blood pressure less than 130/80 given her stroke history to prevent another event.      Relevant Medications   amLODipine (NORVASC) 5 MG tablet    Other Visit Diagnoses    Colon cancer screening    -  Primary   Relevant Orders   Ambulatory referral to Gastroenterology   Need for immunization against influenza       Relevant Orders   Flu Vaccine QUAD 36+ mos IM (Completed)      Return in about 4 months (around 01/27/2020).    [name], MSN, NP-C [facility] for [facility] [place] Pager: [phone] Office: [phone]  10/10/19  2:08 PM

## 2019-09-27 NOTE — Patient Instructions (Addendum)
Please continue taking your Biktarvy every day.    For your blood pressure:  CONTINUE taking the combination pill Lisinopril + HCTZ  START taking amlodipine 1 tablet once a day.   All of these are safe to take at once with the other medications including Biktarvy.   Please stop by the lab on your way out   Will give you your flu shot today  Please stop in to check with our financial team today   Will see you back in 4 months for routine care. Please schedule your lab visit 2 weeks before your visit with me so results are available for our review.

## 2019-09-28 LAB — T-HELPER CELL (CD4) - (RCID CLINIC ONLY)
CD4 % Helper T Cell: 5 % — ABNORMAL LOW (ref 33–65)
CD4 T Cell Abs: 118 /uL — ABNORMAL LOW (ref 400–1790)

## 2019-09-30 LAB — HIV-1 RNA QUANT-NO REFLEX-BLD
HIV 1 RNA Quant: 125 Copies/mL — ABNORMAL HIGH
HIV-1 RNA Quant, Log: 2.1 Log cps/mL — ABNORMAL HIGH

## 2019-09-30 NOTE — Progress Notes (Signed)
Please call Tykeria's daughter to let her know that Mareta's viral load (how much virus in the blood we can count) was higher than I would like to see for her health. I want to make sure she is taking her Biktarvy every single day without missed dose and around the same time of day. Please make sure she is not taking with multivitamins or using any Tums/Rolaids.  She should also continue the antibiotic Bactrim once a day as well - her immune system is still not recovered enough to stop it.

## 2019-10-03 ENCOUNTER — Telehealth: Payer: Self-pay

## 2019-10-03 NOTE — Telephone Encounter (Addendum)
Spoke with the patient's daugther, Sandra [name] to relay the below information per [name], NP. RN informed Sandra [name] that Sandra [name]'s viral load is higher than we would like to see. Sandra [name] states that [name] is taking her Biktarvy every day, but that she does not take it around the same time each day. The patient is also not taking any multivitamins or Tums/Rolaids. RN instructed Sandra [name] that the patient should be taking the Biktarvy around the same time each day, and that the patient should continue taking her Bactrim. Sandra [name] relayed this information to the patient and verbalized understanding. Sandra [name] and the patient have no further questions.   Beryle Flock, RN   ----- Message from [place] Callas, NP sent at 09/30/2019  8:16 PM EDT ----- Please call [name]'s daughter to let her know that Sandra [name]'s viral load (how much virus in the blood we can count) was higher than I would like to see for her health. I want to make sure she is taking her Biktarvy every single day without missed dose and around the same time of day. Please make sure she is not taking with multivitamins or using any Tums/Rolaids.  She should also continue the antibiotic Bactrim once a day as well - her immune system is still not recovered enough to stop it.

## 2019-10-04 ENCOUNTER — Encounter: Payer: Self-pay | Admitting: Gastroenterology

## 2019-10-10 NOTE — Assessment & Plan Note (Addendum)
I will add amlodipine to current regimen once daily.  I asked her to please make an appointment with Dr. Justin Mend to follow-up and titrate medications further.  Would really like to see her blood pressure less than 130/80 given her stroke history to prevent another event.

## 2019-10-10 NOTE — Assessment & Plan Note (Signed)
Her CD4 count continues to be slow to recover.  We discussed today that that is not unexpected given the duration of time she spent not on treatment.  We will continue the Bactrim once a day for now.  No findings on exam or history concerning for opportunistic process.  If she does not come up above 200 cells at our next routine office visit on how ahead and vaccinate against pneumonia, knowing that she will not have a perfect response.  I do think she should get the Covid booster once Moderna has recommendations available.

## 2019-10-10 NOTE — Assessment & Plan Note (Addendum)
She seems to be doing well taking her Biktarvy once a day.  While not perfectly undetectable she is virologically suppressed.  We discussed the importance of trying to keep it at the same time.  I do not identify any drug interactions.  She is vaccinated against Covid.  I will give her her flu shot today.  We will set her up for a Pap smear at the upcoming office visit for routine health screening.  We will update pertinent labs today and have her return in 4 months for regular follow-up.

## 2019-10-12 ENCOUNTER — Other Ambulatory Visit: Payer: Self-pay | Admitting: Infectious Diseases

## 2019-10-12 DIAGNOSIS — B20 Human immunodeficiency virus [HIV] disease: Secondary | ICD-10-CM

## 2019-11-21 ENCOUNTER — Ambulatory Visit (AMBULATORY_SURGERY_CENTER): Payer: Self-pay | Admitting: *Deleted

## 2019-11-21 ENCOUNTER — Other Ambulatory Visit: Payer: Self-pay

## 2019-11-21 VITALS — Ht 61.0 in | Wt 139.0 lb

## 2019-11-21 DIAGNOSIS — Z1211 Encounter for screening for malignant neoplasm of colon: Secondary | ICD-10-CM

## 2019-11-21 MED ORDER — NA SULFATE-K SULFATE-MG SULF 17.5-3.13-1.6 GM/177ML PO SOLN: 1.0000 | Freq: Once | ORAL | 0 refills | Status: AC

## 2019-11-21 NOTE — Progress Notes (Signed)
Patient accompanied by her daughter who interpreted for the patient, and who will be assisting patient with the prep.

## 2019-11-21 NOTE — Progress Notes (Signed)

## 2019-11-28 ENCOUNTER — Other Ambulatory Visit: Payer: Self-pay | Admitting: Infectious Diseases

## 2019-11-29 ENCOUNTER — Encounter: Payer: 59 | Admitting: Gastroenterology

## 2019-12-03 ENCOUNTER — Other Ambulatory Visit: Payer: Self-pay | Admitting: Infectious Diseases

## 2019-12-07 ENCOUNTER — Encounter: Payer: Self-pay | Admitting: Gastroenterology

## 2019-12-07 ENCOUNTER — Other Ambulatory Visit: Payer: Self-pay

## 2019-12-07 ENCOUNTER — Ambulatory Visit (AMBULATORY_SURGERY_CENTER): Payer: 59 | Admitting: Gastroenterology

## 2019-12-07 VITALS — BP 146/77 | HR 70 | Temp 96.0°F | Resp 14 | Ht 61.0 in | Wt 139.0 lb

## 2019-12-07 DIAGNOSIS — Z1211 Encounter for screening for malignant neoplasm of colon: Secondary | ICD-10-CM

## 2019-12-07 DIAGNOSIS — D123 Benign neoplasm of transverse colon: Secondary | ICD-10-CM

## 2019-12-07 MED ORDER — SODIUM CHLORIDE 0.9 % IV SOLN: 500.0000 mL | INTRAVENOUS | Status: DC

## 2019-12-07 NOTE — Op Note (Signed)
[place] Patient Name: Peter Daquila Procedure Date: 12/07/2019 1:26 PM MRN: [id number] Endoscopist: Milus Banister , MD Age: 64 Referring MD:  Date of Birth: 1955/03/02 Gender: Female Account #: [id number] Procedure:                Colonoscopy Indications:              Screening for colorectal malignant neoplasm Medicines:                Monitored Anesthesia Care Procedure:                Pre-Anesthesia Assessment:                           - Prior to the procedure, a History and Physical                            was performed, and patient medications and                            allergies were reviewed. The patient's tolerance of                            previous anesthesia was also reviewed. The risks                            and benefits of the procedure and the sedation                            options and risks were discussed with the patient.                            All questions were answered, and informed consent                            was obtained. Prior Anticoagulants: The patient has                            taken no previous anticoagulant or antiplatelet                            agents. ASA Grade Assessment: II - A patient with                            mild systemic disease. After reviewing the risks                            and benefits, the patient was deemed in                            satisfactory condition to undergo the procedure.                           After obtaining informed consent, the colonoscope  was passed under direct vision. Throughout the                            procedure, the patient's blood pressure, pulse, and                            oxygen saturations were monitored continuously. The                            Colonoscope was introduced through the anus and                            advanced to the the cecum, identified by                            appendiceal orifice and  ileocecal valve. The                            colonoscopy was performed without difficulty. The                            patient tolerated the procedure well. The quality                            of the bowel preparation was good. The ileocecal                            valve, appendiceal orifice, and rectum were                            photographed. Scope In: 1:37:44 PM Scope Out: 1:47:35 PM Scope Withdrawal Time: 0 hours 7 minutes 52 seconds  Total Procedure Duration: 0 hours 9 minutes 51 seconds  Findings:                 A 3 mm polyp was found in the ascending colon. The                            polyp was sessile. The polyp was removed with a                            cold snare. Resection and retrieval were complete.                           Internal hemorrhoids were found. The hemorrhoids                            were small.                           The exam was otherwise without abnormality on                            direct and retroflexion views. Complications:  No immediate complications. Estimated blood loss:                            None. Estimated Blood Loss:     Estimated blood loss: none. Impression:               - One 3 mm polyp in the ascending colon, removed                            with a cold snare. Resected and retrieved.                           - Internal hemorrhoids.                           - The examination was otherwise normal on direct                            and retroflexion views. Recommendation:           - Patient has a contact number available for                            emergencies. The signs and symptoms of potential                            delayed complications were discussed with the                            patient. Return to normal activities tomorrow.                            Written discharge instructions were provided to the                            patient.                           - Resume  previous diet.                           - Continue present medications.                           - Await pathology results. Milus Banister, MD 12/07/2019 2:15:02 PM This report has been signed electronically.

## 2019-12-07 NOTE — Progress Notes (Signed)
Pt's son interpreted discharge instructions as no interpreter was available at this facility today. They both verbalized understanding of instructions.

## 2019-12-07 NOTE — Progress Notes (Signed)
V/s [place] Pt needs interpeteur: No available interpreteur to translat.   Pt is using her adult son as her interpreteur.

## 2019-12-07 NOTE — Patient Instructions (Signed)
Handouts provided on polyps and hemorrhoids.   YOU HAD AN ENDOSCOPIC PROCEDURE TODAY AT THE [facility]:   Refer to the procedure report that was given to you for any specific questions about what was found during the examination.  If the procedure report does not answer your questions, please call your gastroenterologist to clarify.  If you requested that your care partner not be given the details of your procedure findings, then the procedure report has been included in a sealed envelope for you to review at your convenience later.  YOU SHOULD EXPECT: Some feelings of bloating in the abdomen. Passage of more gas than usual.  Walking can help get rid of the air that was put into your GI tract during the procedure and reduce the bloating. If you had a lower endoscopy (such as a colonoscopy or flexible sigmoidoscopy) you may notice spotting of blood in your stool or on the toilet paper. If you underwent a bowel prep for your procedure, you may not have a normal bowel movement for a few days.  Please Note:  You might notice some irritation and congestion in your nose or some drainage.  This is from the oxygen used during your procedure.  There is no need for concern and it should clear up in a day or so.  SYMPTOMS TO REPORT IMMEDIATELY:  Following lower endoscopy (colonoscopy or flexible sigmoidoscopy):  Excessive amounts of blood in the stool  Significant tenderness or worsening of abdominal pains  Swelling of the abdomen that is new, acute  Fever of 100F or higher  For urgent or emergent issues, a gastroenterologist can be reached at any hour by calling [phone]. Do not use MyChart messaging for urgent concerns.    DIET:  We do recommend a small meal at first, but then you may proceed to your regular diet.  Drink plenty of fluids but you should avoid alcoholic beverages for 24 hours.  ACTIVITY:  You should plan to take it easy for the rest of today and you should NOT DRIVE  or use heavy machinery until tomorrow (because of the sedation medicines used during the test).    FOLLOW UP: Our staff will call the number listed on your records 48-72 hours following your procedure to check on you and address any questions or concerns that you may have regarding the information given to you following your procedure. If we do not reach you, we will leave a message.  We will attempt to reach you two times.  During this call, we will ask if you have developed any symptoms of COVID 19. If you develop any symptoms (ie: fever, flu-like symptoms, shortness of breath, cough etc.) before then, please call [phone].  If you test positive for Covid 19 in the 2 weeks post procedure, please call and report this information to us.    If any biopsies were taken you will be contacted by phone or by letter within the next 1-3 weeks.  Please call us at [phone] if you have not heard about the biopsies in 3 weeks.    SIGNATURES/CONFIDENTIALITY: You and/or your care partner have signed paperwork which will be entered into your electronic medical record.  These signatures attest to the fact that that the information above on your After Visit Summary has been reviewed and is understood.  Full responsibility of the confidentiality of this discharge information lies with you and/or your care-partner.  

## 2019-12-07 NOTE — Progress Notes (Signed)
Report given to PACU, vss 

## 2019-12-07 NOTE — Progress Notes (Signed)
Called to room to assist during endoscopic procedure.  Patient ID and intended procedure confirmed with present staff. Received instructions for my participation in the procedure from the performing physician.  

## 2019-12-09 ENCOUNTER — Telehealth: Payer: Self-pay | Admitting: *Deleted

## 2019-12-09 NOTE — Telephone Encounter (Signed)
Follow up Call-  Call back number 12/07/2019  Post procedure Call Back phone  # [phone]  Permission to leave phone message Yes  Some recent data might be hidden     Patient questions:  Do you have a fever, pain , or abdominal swelling? No. Pain Score  0 *  Have you tolerated food without any problems? Yes.    Have you been able to return to your normal activities? Yes.    Do you have any questions about your discharge instructions: Diet   No. Medications  No. Follow up visit  No.  Do you have questions or concerns about your Care? No.  Actions: * If pain score is 4 or above: No action needed, pain <4.     Person Under Monitoring Name: Sandra [name]  Location: [place] [address]   Infection Prevention Recommendations for Individuals Confirmed to have, or Being Evaluated for, 2019 Novel Coronavirus (COVID-19) Infection Who Receive Care at Home  Individuals who are confirmed to have, or are being evaluated for, COVID-19 should follow the prevention steps below until a healthcare provider or local or state health department says they can return to normal activities.  Stay home except to get medical care You should restrict activities outside your home, except for getting medical care. Do not go to work, school, or public areas, and do not use public transportation or taxis.  Call ahead before visiting your doctor Before your medical appointment, call the healthcare provider and tell them that you have, or are being evaluated for, COVID-19 infection. This will help the healthcare provider's office take steps to keep other people from getting infected. Ask your healthcare provider to call the local or state health department.  Monitor your symptoms Seek prompt medical attention if your illness is worsening (e.g., difficulty breathing). Before going to your medical appointment, call the healthcare provider and tell them that you have, or are  being evaluated for, COVID-19 infection. Ask your healthcare provider to call the local or state health department.  Wear a facemask You should wear a facemask that covers your nose and mouth when you are in the same room with other people and when you visit a healthcare provider. People who live with or visit you should also wear a facemask while they are in the same room with you.  Separate yourself from other people in your home As much as possible, you should stay in a different room from other people in your home. Also, you should use a separate bathroom, if available.  Avoid sharing household items You should not share dishes, drinking glasses, cups, eating utensils, towels, bedding, or other items with other people in your home. After using these items, you should wash them thoroughly with soap and water.  Cover your coughs and sneezes Cover your mouth and nose with a tissue when you cough or sneeze, or you can cough or sneeze into your sleeve. Throw used tissues in a lined trash can, and immediately wash your hands with soap and water for at least 20 seconds or use an alcohol-based hand rub.  Wash your [facility] your hands often and thoroughly with soap and water for at least 20 seconds. You can use an alcohol-based hand sanitizer if soap and water are not available and if your hands are not visibly dirty. Avoid touching your eyes, nose, and mouth with unwashed hands.   Prevention Steps for Caregivers and Household Members of Individuals Confirmed to have, or  Being Evaluated for, COVID-19 Infection Being Cared for in the Home  If you live with, or provide care at home for, a person confirmed to have, or being evaluated for, COVID-19 infection please follow these guidelines to prevent infection:  Follow healthcare provider's instructions Make sure that you understand and can help the patient follow any healthcare provider instructions for all care.  Provide for the  patient's basic needs You should help the patient with basic needs in the home and provide support for getting groceries, prescriptions, and other personal needs.  Monitor the patient's symptoms If they are getting sicker, call his or her medical provider and tell them that the patient has, or is being evaluated for, COVID-19 infection. This will help the healthcare provider's office take steps to keep other people from getting infected. Ask the healthcare provider to call the local or state health department.  Limit the number of people who have contact with the patient  If possible, have only one caregiver for the patient.  Other household members should stay in another home or place of residence. If this is not possible, they should stay  in another room, or be separated from the patient as much as possible. Use a separate bathroom, if available.  Restrict visitors who do not have an essential need to be in the home.  Keep older adults, very young children, and other sick people away from the patient Keep older adults, very young children, and those who have compromised immune systems or chronic health conditions away from the patient. This includes people with chronic heart, lung, or kidney conditions, diabetes, and cancer.  Ensure good ventilation Make sure that shared spaces in the home have good air flow, such as from an air conditioner or an opened window, weather permitting.  Wash your hands often  Wash your hands often and thoroughly with soap and water for at least 20 seconds. You can use an alcohol based hand sanitizer if soap and water are not available and if your hands are not visibly dirty.  Avoid touching your eyes, nose, and mouth with unwashed hands.  Use disposable paper towels to dry your hands. If not available, use dedicated cloth towels and replace them when they become wet.  Wear a facemask and gloves  Wear a disposable facemask at all times in the room  and gloves when you touch or have contact with the patient's blood, body fluids, and/or secretions or excretions, such as sweat, saliva, sputum, nasal mucus, vomit, urine, or feces.  Ensure the mask fits over your nose and mouth tightly, and do not touch it during use.  Throw out disposable facemasks and gloves after using them. Do not reuse.  Wash your hands immediately after removing your facemask and gloves.  If your personal clothing becomes contaminated, carefully remove clothing and launder. Wash your hands after handling contaminated clothing.  Place all used disposable facemasks, gloves, and other waste in a lined container before disposing them with other household waste.  Remove gloves and wash your hands immediately after handling these items.  Do not share dishes, glasses, or other household items with the patient  Avoid sharing household items. You should not share dishes, drinking glasses, cups, eating utensils, towels, bedding, or other items with a patient who is confirmed to have, or being evaluated for, COVID-19 infection.  After the person uses these items, you should wash them thoroughly with soap and water.  Wash laundry thoroughly  Immediately remove and wash clothes or bedding  that have blood, body fluids, and/or secretions or excretions, such as sweat, saliva, sputum, nasal mucus, vomit, urine, or feces, on them.  Wear gloves when handling laundry from the patient.  Read and follow directions on labels of laundry or clothing items and detergent. In general, wash and dry with the warmest temperatures recommended on the label.   Follow up Call-  Call back number 12/07/2019  Post procedure Call Back phone  # [phone]  Permission to leave phone message Yes  Some recent data might be hidden     Patient questions:  Do you have a fever, pain , or abdominal swelling? No. Pain Score  0 *  Have you tolerated food without any problems? Yes.    Have you been  able to return to your normal activities? Yes.    Do you have any questions about your discharge instructions: Diet   No. Medications  No. Follow up visit  No.  Do you have questions or concerns about your Care? No.  Actions: * If pain score is 4 or above: No action needed, pain <4.  1. Have you developed a fever since your procedure? no  2.   Have you had an respiratory symptoms (SOB or cough) since your procedure? no  3.   Have you tested positive for COVID 19 since your procedure no  4.   Have you had any family members/close contacts diagnosed with the COVID 19 since your procedure? no   If yes to any of these questions please route to Joylene John, RN and Joella Prince, RN

## 2019-12-14 ENCOUNTER — Encounter: Payer: Self-pay | Admitting: Gastroenterology

## 2020-01-30 ENCOUNTER — Other Ambulatory Visit: Payer: 59

## 2020-02-02 ENCOUNTER — Other Ambulatory Visit: Payer: 59

## 2020-02-02 ENCOUNTER — Ambulatory Visit: Payer: 59

## 2020-02-02 ENCOUNTER — Other Ambulatory Visit: Payer: Self-pay

## 2020-02-02 DIAGNOSIS — B20 Human immunodeficiency virus [HIV] disease: Secondary | ICD-10-CM

## 2020-02-03 LAB — T-HELPER CELL (CD4) - (RCID CLINIC ONLY)
CD4 % Helper T Cell: 2 % — ABNORMAL LOW (ref 33–65)
CD4 T Cell Abs: 68 /uL — ABNORMAL LOW (ref 400–1790)

## 2020-02-05 LAB — CBC WITH DIFFERENTIAL/PLATELET
Absolute Monocytes: 806 cells/uL (ref 200–950)
Basophils Absolute: 19 cells/uL (ref 0–200)
Basophils Relative: 0.3 %
Eosinophils Absolute: 38 cells/uL (ref 15–500)
Eosinophils Relative: 0.6 %
HCT: 39.2 % (ref 35.0–45.0)
Hemoglobin: 13.3 g/dL (ref 11.7–15.5)
Lymphs Abs: 3383 cells/uL (ref 850–3900)
MCH: 30.6 pg (ref 27.0–33.0)
MCHC: 33.9 g/dL (ref 32.0–36.0)
MCV: 90.1 fL (ref 80.0–100.0)
MPV: 12.9 fL — ABNORMAL HIGH (ref 7.5–12.5)
Monocytes Relative: 12.8 %
Neutro Abs: 2054 cells/uL (ref 1500–7800)
Neutrophils Relative %: 32.6 %
Platelets: 187 10*3/uL (ref 140–400)
RBC: 4.35 10*6/uL (ref 3.80–5.10)
RDW: 11.3 % (ref 11.0–15.0)
Total Lymphocyte: 53.7 %
WBC: 6.3 10*3/uL (ref 3.8–10.8)

## 2020-02-05 LAB — COMPLETE METABOLIC PANEL WITH GFR
AG Ratio: 1.3 (calc) (ref 1.0–2.5)
ALT: 16 U/L (ref 6–29)
AST: 20 U/L (ref 10–35)
Albumin: 4.5 g/dL (ref 3.6–5.1)
Alkaline phosphatase (APISO): 55 U/L (ref 37–153)
BUN: 13 mg/dL (ref 7–25)
CO2: 28 mmol/L (ref 20–32)
Calcium: 9.5 mg/dL (ref 8.6–10.4)
Chloride: 98 mmol/L (ref 98–110)
Creat: 0.91 mg/dL (ref 0.50–0.99)
GFR, Est African American: 77 mL/min/{1.73_m2} (ref >=60)
GFR, Est Non African American: 66 mL/min/{1.73_m2} (ref >=60)
Globulin: 3.5 g/dL (calc) (ref 1.9–3.7)
Glucose, Bld: 131 mg/dL — ABNORMAL HIGH (ref 65–99)
Potassium: 4.6 mmol/L (ref 3.5–5.3)
Sodium: 132 mmol/L — ABNORMAL LOW (ref 135–146)
Total Bilirubin: 0.7 mg/dL (ref 0.2–1.2)
Total Protein: 8 g/dL (ref 6.1–8.1)

## 2020-02-05 LAB — HIV-1 RNA QUANT-NO REFLEX-BLD
HIV 1 RNA Quant: 92500 Copies/mL — ABNORMAL HIGH
HIV-1 RNA Quant, Log: 4.97 Log cps/mL — ABNORMAL HIGH

## 2020-02-05 LAB — LIPID PANEL
Cholesterol: 166 mg/dL (ref <200)
HDL: 35 mg/dL — ABNORMAL LOW (ref >=50)
LDL Cholesterol (Calc): 100 mg/dL (calc) — ABNORMAL HIGH
Non-HDL Cholesterol (Calc): 131 mg/dL (calc) — ABNORMAL HIGH (ref <130)
Total CHOL/HDL Ratio: 4.7 (calc) (ref <5.0)
Triglycerides: 192 mg/dL — ABNORMAL HIGH (ref <150)

## 2020-02-05 LAB — RPR: RPR Ser Ql: NONREACTIVE

## 2020-02-09 ENCOUNTER — Other Ambulatory Visit: Payer: Self-pay | Admitting: Infectious Diseases

## 2020-02-14 ENCOUNTER — Other Ambulatory Visit: Payer: Self-pay

## 2020-02-14 ENCOUNTER — Encounter: Payer: Self-pay | Admitting: Infectious Diseases

## 2020-02-14 ENCOUNTER — Ambulatory Visit (INDEPENDENT_AMBULATORY_CARE_PROVIDER_SITE_OTHER): Payer: 59 | Admitting: Infectious Diseases

## 2020-02-14 VITALS — BP 169/73 | HR 71 | Temp 98.2°F

## 2020-02-14 DIAGNOSIS — B20 Human immunodeficiency virus [HIV] disease: Secondary | ICD-10-CM

## 2020-02-14 DIAGNOSIS — E785 Hyperlipidemia, unspecified: Secondary | ICD-10-CM | POA: Diagnosis not present

## 2020-02-14 DIAGNOSIS — I63331 Cerebral infarction due to thrombosis of right posterior cerebral artery: Secondary | ICD-10-CM

## 2020-02-14 DIAGNOSIS — I10 Essential (primary) hypertension: Secondary | ICD-10-CM | POA: Diagnosis not present

## 2020-02-14 MED ORDER — LISINOPRIL-HYDROCHLOROTHIAZIDE 20-12.5 MG PO TABS: 1.0000 | ORAL_TABLET | Freq: Every day | ORAL | 3 refills | Status: DC

## 2020-02-14 MED ORDER — BIKTARVY 50-200-25 MG PO TABS
1.0000 | ORAL_TABLET | Freq: Every day | ORAL | 11 refills | Status: DC
Start: 2020-02-14 — End: 2020-02-14

## 2020-02-14 MED ORDER — SIMVASTATIN 40 MG PO TABS: 40.0000 mg | ORAL_TABLET | Freq: Every day | ORAL | 11 refills | Status: DC

## 2020-02-14 MED ORDER — BIKTARVY 50-200-25 MG PO TABS
1.0000 | ORAL_TABLET | Freq: Every day | ORAL | 11 refills | Status: DC
Start: 2020-02-14 — End: 2020-07-31

## 2020-02-14 MED ORDER — AMLODIPINE BESYLATE 10 MG PO TABS: 10.0000 mg | ORAL_TABLET | Freq: Every day | ORAL | 2 refills | Status: DC

## 2020-02-14 MED ORDER — AMLODIPINE BESYLATE 10 MG PO TABS
10.0000 mg | ORAL_TABLET | Freq: Every day | ORAL | 2 refills | Status: DC
Start: 2020-02-14 — End: 2020-02-14

## 2020-02-14 NOTE — Progress Notes (Signed)
Name: Sandra [name]  DOB: August 30, 1955 MRN: [id number] PCP: Maurice Small, MD    Subjective:   Chief Complaint  Patient presents with  . Follow-up      HPI: Off medications for 3 months now - tried to get them filled but unable to get the pharmacy to fill them. Her son - in law is here today and has tried to call the pharmacy to figure out what's going on and they directed them here.   Reports no complaints today suggestive of associated opportunistic infection or advancing HIV disease such as fevers, night sweats, weight loss, anorexia, cough, SOB, nausea, vomiting, diarrhea, headache, sensory changes, lymphadenopathy or oral thrush.   She has been taking her lisinopril-HCTZ. Taking cholesterol medication.  Amlodipine has not been picked up.   She has some trouble sleeping - goes to sleep around 7:30 pm and wakes up in the middle of the night and has trouble getting back to sleep around 2 or 3. Would like to be able to wake at 5:30 or 6 am. Feels like she gets good quality sleep.     Review of Systems  Constitutional: Negative for chills and fever.  HENT: Negative for tinnitus.   Eyes: Negative for blurred vision and photophobia.  Respiratory: Negative for cough and sputum production.   Cardiovascular: Negative for chest pain.  Gastrointestinal: Negative for diarrhea, nausea and vomiting.  Genitourinary: Negative for dysuria.  Musculoskeletal: Negative for back pain, falls and joint pain.  Skin: Negative for rash.  Neurological: Negative for headaches.     Past Medical History:  Diagnosis Date  . Arthritis   . Headache   . HIV (human immunodeficiency virus infection) ([place]) 01/19/2019  . Hypertension   . Stroke [facility])     Outpatient Medications Prior to Visit  Medication Sig Dispense Refill  . amLODipine (NORVASC) 5 MG tablet Take 1 tablet (5 mg total) by mouth daily. 30 tablet 3  . bictegravir-emtricitabine-tenofovir AF (BIKTARVY) 50-200-25 MG TABS tablet Take 1  tablet by mouth daily. 30 tablet 11  . lisinopril-hydrochlorothiazide (ZESTORETIC) 20-12.5 MG tablet TAKE 1 TABLET BY MOUTH DAILY 30 tablet 1  . simvastatin (ZOCOR) 40 MG tablet TAKE 1 TABLET(40 MG) BY MOUTH DAILY AT 6 PM 30 tablet 3   No facility-administered medications prior to visit.     No Known Allergies  Social History   Tobacco Use  . Smoking status: Never Smoker  . Smokeless tobacco: Never Used  Vaping Use  . Vaping Use: Never used  Substance Use Topics  . Alcohol use: Never  . Drug use: Never     Social History   Substance and Sexual Activity  Sexual Activity Not on file     Objective:   Vitals:   02/14/20 1046  BP: (!) 169/73  Pulse: 71  Temp: 98.2 F (36.8 C)  TempSrc: Oral   There is no height or weight on file to calculate BMI.  Physical Exam Constitutional:      Appearance: She is well-developed.     Comments: Seated comfortably in chair.   HENT:     Mouth/Throat:     Mouth: Mucous membranes are moist. No oral lesions.     Dentition: Normal dentition. No dental abscesses.     Pharynx: Oropharynx is clear. No oropharyngeal exudate.     Comments: Partials in place Cardiovascular:     Rate and Rhythm: Normal rate and regular rhythm.     Heart sounds: Normal heart sounds.  Pulmonary:  Effort: Pulmonary effort is normal.     Breath sounds: Normal breath sounds.  Abdominal:     General: There is no distension.     Palpations: Abdomen is soft.     Tenderness: There is no abdominal tenderness.  Lymphadenopathy:     Cervical: No cervical adenopathy.  Skin:    General: Skin is warm and dry.     Findings: No rash.  Neurological:     Mental Status: She is alert and oriented to person, place, and time.  Psychiatric:        Judgment: Judgment normal.     Comments: In good spirits today and engaged in care discussion     Lab Results Lab Results  Component Value Date   WBC 6.3 02/02/2020   HGB 13.3 02/02/2020   HCT 39.2 02/02/2020    MCV 90.1 02/02/2020   PLT 187 02/02/2020    Lab Results  Component Value Date   CREATININE 0.91 02/02/2020   BUN 13 02/02/2020   NA 132 (L) 02/02/2020   K 4.6 02/02/2020   CL 98 02/02/2020   CO2 28 02/02/2020    Lab Results  Component Value Date   ALT 16 02/02/2020   AST 20 02/02/2020   ALKPHOS 56 11/25/2017   BILITOT 0.7 02/02/2020    Lab Results  Component Value Date   CHOL 166 02/02/2020   HDL 35 (L) 02/02/2020   LDLCALC 100 (H) 02/02/2020   TRIG 192 (H) 02/02/2020   CHOLHDL 4.7 02/02/2020   HIV 1 RNA Quant  Date Value  02/02/2020 92,500 Copies/mL (H)  09/27/2019 125 Copies/mL (H)  05/16/2019 63 copies/mL (H)   CD4 T Cell Abs (/uL)  Date Value  02/02/2020 68 (L)  09/27/2019 118 (L)  05/16/2019 141 (L)     Assessment & Plan:   Patient Active Problem List   Diagnosis Date Noted  . HIV (human immunodeficiency virus infection) ([place]) 01/19/2019  . AIDS (acquired immune deficiency syndrome) ([place]) 11/30/2017  . Chronic pain of left knee 05/16/2019  . CVA (cerebral vascular accident) ([place]) 11/25/2017  . Bronchitis 11/25/2017  . Cerebral infarction ([place]) 02/08/2015  . Stroke with cerebral ischemia ([place])   . Stroke ([place]) 12/08/2014  . Left-sided weakness   . HLD (hyperlipidemia)   . Benign essential HTN   . Cerebrovascular accident (CVA) due to occlusion of cerebral artery ([place])     Problem List Items Addressed This Visit      High   HIV (human immunodeficiency virus infection) ([place]) (Chronic)    Will send in Rx to [facility] on [address] for copay assistance (they were trying to collect $600 copay at other pharmacy). She will come back in 6 weeks to repeat labs for HIV therapeutic monitoring back on medications. I gave her 2 weeks of samples today to bridge her until we can correct the problem with medication access.       Relevant Medications   bictegravir-emtricitabine-tenofovir AF (BIKTARVY) 50-200-25 MG TABS tablet   AIDS (acquired immune deficiency  syndrome) ([facility]) - Primary    Continue Bactrim daily for at least 3 months and reassess CD4.       Relevant Medications   bictegravir-emtricitabine-tenofovir AF (BIKTARVY) 50-200-25 MG TABS tablet   Other Relevant Orders   HIV-1 RNA quant-no reflex-bld     Unprioritized   HLD (hyperlipidemia)    Transferring simvastatin to new pharmacy.       Relevant Medications   amLODipine (NORVASC) 10 MG tablet   lisinopril-hydrochlorothiazide (ZESTORETIC)  20-12.5 MG tablet   simvastatin (ZOCOR) 40 MG tablet   CVA (cerebral vascular accident) ([place])   Relevant Medications   amLODipine (NORVASC) 10 MG tablet   lisinopril-hydrochlorothiazide (ZESTORETIC) 20-12.5 MG tablet   simvastatin (ZOCOR) 40 MG tablet   Benign essential HTN    Her son in law and Sheera both understand they should pick up the amlodipine - will increase to 10 mg as I think she will need this dose ultimately.  Continue Lisinopril-HCTZ at current dose.  Follow up in 80m upon return.  She has had a hard time following up with PCP for chronic care of other conditions in part due to language barrier.       Relevant Medications   amLODipine (NORVASC) 10 MG tablet   lisinopril-hydrochlorothiazide (ZESTORETIC) 20-12.5 MG tablet   simvastatin (ZOCOR) 40 MG tablet      Return in about 2 months (around 04/13/2020).   I spent 25 minutes with the patient in direct care/counsel and in coordination efforts to fill her anti retrovirals.    [name], MSN, NP-C [facility] [place] Pager: [phone] Office: [phone]  02/14/20  11:32 AM

## 2020-02-14 NOTE — Assessment & Plan Note (Signed)
Continue Bactrim daily for at least 3 months and reassess CD4.

## 2020-02-14 NOTE — Patient Instructions (Addendum)
I refilled the Biktarvy - will give you a 2 weeks supply now. If you continue to have problems getting your medication call us at [phone] so we can look into it with the pharmacy.   Please continue your Bactrim every day for now.   Please pick up the Amlodipine (blood pressure pill) and start taking this once a day with your Lisinopril-hydrochlorothiazide. It is very important to prevent another stroke for you.   Always give [place] a call if you have trouble getting your medication.   Will plan to see you back in 2 months with labs before your visit back.   Your prescriptions have to be picked up at a new pharmacy that can help with the medication  Address: [address] [place], [place] [address] Phone: [phone]

## 2020-02-14 NOTE — Assessment & Plan Note (Signed)
Transferring simvastatin to new pharmacy.

## 2020-02-14 NOTE — Assessment & Plan Note (Signed)
Will send in Rx to [facility] on [address] for copay assistance (they were trying to collect $600 copay at other pharmacy). She will come back in 6 weeks to repeat labs for HIV therapeutic monitoring back on medications. I gave her 2 weeks of samples today to bridge her until we can correct the problem with medication access.

## 2020-02-14 NOTE — Assessment & Plan Note (Signed)
Her son in law and Kathelene both understand they should pick up the amlodipine - will increase to 10 mg as I think she will need this dose ultimately.  Continue Lisinopril-HCTZ at current dose.  Follow up in 49m upon return.  She has had a hard time following up with PCP for chronic care of other conditions in part due to language barrier.

## 2020-03-05 ENCOUNTER — Encounter: Payer: Self-pay | Admitting: Infectious Diseases

## 2020-04-03 ENCOUNTER — Other Ambulatory Visit: Payer: Self-pay

## 2020-04-03 ENCOUNTER — Other Ambulatory Visit: Payer: 59

## 2020-04-03 DIAGNOSIS — B20 Human immunodeficiency virus [HIV] disease: Secondary | ICD-10-CM

## 2020-04-05 LAB — HIV-1 RNA QUANT-NO REFLEX-BLD
HIV 1 RNA Quant: 95 Copies/mL — ABNORMAL HIGH
HIV-1 RNA Quant, Log: 1.98 Log cps/mL — ABNORMAL HIGH

## 2020-04-16 NOTE — Progress Notes (Signed)
Name: Sandra [name]  DOB: [birth year] MRN: [id number] PCP: Maurice Small, MD    Subjective:   Chief Complaint  Patient presents with  . Follow-up    Swelling in feet and pain, left hand pain       HPI: Sandra [name] is here with her daughter for follow-up.  They tell me that she has been back on her medications consistently since her last appointment.  No trouble getting these medications.  She is out of the Bactrim however and is unsure if she needs to continue this.  Reports no complaints today suggestive of associated opportunistic infection or advancing HIV disease such as fevers, night sweats, weight loss, anorexia, cough, SOB, nausea, vomiting, diarrhea, headache, sensory changes, lymphadenopathy or oral thrush.   She has her cholesterol medication and amlodipine with her today.  She reports that over the last 1 week she has had increased swelling and foot pain bilaterally.  A family friend is a physician and wonders if it is due to the amlodipine.  This is the only blood pressure medication she is taking. She also has some swelling and pain to the left medial arm right above the elbow.  No injury so to say.  This is the side had a stroke affecting her arm.  No concerns with grip strength or function of the hand.  Has full range of motion but there is pain in this area.    Review of Systems  Constitutional: Negative for chills and fever.  HENT: Negative for tinnitus.   Eyes: Negative for blurred vision and photophobia.  Respiratory: Negative for cough and sputum production.   Cardiovascular: Positive for leg swelling. Negative for chest pain.  Gastrointestinal: Negative for diarrhea, nausea and vomiting.  Genitourinary: Negative for dysuria.  Musculoskeletal: Positive for joint pain. Negative for back pain and falls.  Skin: Negative for rash.  Neurological: Negative for headaches.     Past Medical History:  Diagnosis Date  . Arthritis   . Headache   . HIV (human  immunodeficiency virus infection) ([place]) 01/19/2019  . Hypertension   . Stroke [facility])     Outpatient Medications Prior to Visit  Medication Sig Dispense Refill  . bictegravir-emtricitabine-tenofovir AF (BIKTARVY) 50-200-25 MG TABS tablet Take 1 tablet by mouth daily. 30 tablet 11  . simvastatin (ZOCOR) 40 MG tablet Take 1 tablet (40 mg total) by mouth daily at 6 PM. 30 tablet 11  . amLODipine (NORVASC) 10 MG tablet Take 1 tablet (10 mg total) by mouth daily. 30 tablet 2  . lisinopril-hydrochlorothiazide (ZESTORETIC) 20-12.5 MG tablet Take 1 tablet by mouth daily. 30 tablet 3   No facility-administered medications prior to visit.     No Known Allergies  Social History   Tobacco Use  . Smoking status: Never Smoker  . Smokeless tobacco: Never Used  Vaping Use  . Vaping Use: Never used  Substance Use Topics  . Alcohol use: Never  . Drug use: Never     Social History   Substance and Sexual Activity  Sexual Activity Not on file     Objective:   Vitals:   04/17/20 1115  Weight: 137 lb (62.1 kg)   Body mass index is 25.89 kg/m.  Physical Exam Vitals reviewed.  Constitutional:      Appearance: She is well-developed.     Comments: Seated comfortably in chair.   HENT:     Mouth/Throat:     Mouth: No oral lesions.     Dentition: Normal dentition. No dental abscesses.  Pharynx: No oropharyngeal exudate.  Cardiovascular:     Rate and Rhythm: Normal rate and regular rhythm.     Heart sounds: Normal heart sounds.  Pulmonary:     Effort: Pulmonary effort is normal.     Breath sounds: Normal breath sounds.  Abdominal:     General: There is no distension.     Palpations: Abdomen is soft.     Tenderness: There is no abdominal tenderness.  Musculoskeletal:       Arms:     Right lower leg: Edema present.     Left lower leg: Edema present.     Comments: Trace edema noted to bilateral feet.  She has imprints of her sandals visualized today.  The edema does not go much  above her mid shin.  She does have discomfort with the fluid.  No open wounds or lesions.  Adequate pulse sites bilaterally.  Intact sensation.  Lymphadenopathy:     Cervical: No cervical adenopathy.  Skin:    General: Skin is warm and dry.     Findings: No rash.  Neurological:     Mental Status: She is alert and oriented to person, place, and time.  Psychiatric:        Judgment: Judgment normal.     Comments: In good spirits today and engaged in care discussion     Lab Results Lab Results  Component Value Date   WBC 6.3 02/02/2020   HGB 13.3 02/02/2020   HCT 39.2 02/02/2020   MCV 90.1 02/02/2020   PLT 187 02/02/2020    Lab Results  Component Value Date   CREATININE 0.91 02/02/2020   BUN 13 02/02/2020   NA 132 (L) 02/02/2020   K 4.6 02/02/2020   CL 98 02/02/2020   CO2 28 02/02/2020    Lab Results  Component Value Date   ALT 16 02/02/2020   AST 20 02/02/2020   ALKPHOS 56 11/25/2017   BILITOT 0.7 02/02/2020    Lab Results  Component Value Date   CHOL 166 02/02/2020   HDL 35 (L) 02/02/2020   LDLCALC 100 (H) 02/02/2020   TRIG 192 (H) 02/02/2020   CHOLHDL 4.7 02/02/2020   HIV 1 RNA Quant (Copies/mL)  Date Value  04/03/2020 95 (H)  02/02/2020 92,500 (H)  09/27/2019 125 (H)   CD4 T Cell Abs (/uL)  Date Value  02/02/2020 68 (L)  09/27/2019 118 (L)  05/16/2019 141 (L)     Assessment & Plan:   Patient Active Problem List   Diagnosis Date Noted  . HIV (human immunodeficiency virus infection) ([place]) 01/19/2019  . AIDS (acquired immune deficiency syndrome) ([place]) 11/30/2017  . Pedal edema 04/17/2020  . Pain and swelling of left elbow 04/17/2020  . Chronic pain of left knee 05/16/2019  . CVA (cerebral vascular accident) ([place]) 11/25/2017  . Cerebral infarction ([place]) 02/08/2015  . Stroke with cerebral ischemia ([place])   . Stroke ([place]) 12/08/2014  . Left-sided weakness   . HLD (hyperlipidemia)   . Benign essential HTN   . Cerebrovascular accident (CVA) due to  occlusion of cerebral artery ([place])     Problem List Items Addressed This Visit      High   HIV (human immunodeficiency virus infection) ([place]) (Chronic)    We spent time reviewing her historical labs today and highlighted the fact that she after just being off medicine for a few months had a significant increase in her viral load reduction in her CD4 count.  Reinforced today that she really  should not stop this medication to give her body time to recover.  We discussed that it will probably take a few years for her immune system to fully rebuild.  CD4 count dropped to 60 from 140. Sounds like the pharmacy team assisted her with co-pay assistance.  They are very thankful for this. Pfizer booster and Pneumovax given today. We will have her back between 2 and 4 months depending on how soon she can see her PCP to follow-up on other changes today.  We can do labs prior to a 56-month visit but not necessary at 2.      AIDS (acquired immune deficiency syndrome) ([place]) - Primary    Counseled today on continuing the Biktarvy and Bactrim together until her immune system which is a level over 200 for 3 months.  Most recent count was 60.  No AIDS defining illnesses or opportunistic infections noted on exam today.      Relevant Orders   HIV-1 RNA quant-no reflex-bld   T-helper cell (CD4)- ([facility] clinic only)   Pneumococcal polysaccharide vaccine 23-valent greater than or equal to 2yo subcutaneous/IM (Completed)     Unprioritized   Pedal edema    New problem for [facility]  I suspect is due to the amlodipine as a side effect of the medication.  I will stop this.  Refill sent in for her lisinopril HCTZ combo pill 20-12.5 mg.  Advised to take this in the morning as it will increase her urination.      Pain and swelling of left elbow    Inner medial elbow she is having pain and what she perceives as swelling.  On exam it looks pretty similar in size to the right arm.  While there is no erythema, cording,  induration or fluctuance she is tender overlying one of the biceps tendons.  I will treat conservatively with ice and rest.  I asked him to please call me if this increases in size and can consider ordering an ultrasound.      Benign essential HTN    Stop amlodipine.  Start Zestoretic 20-12.5 mg tab once daily. FU with PCP in 2 months       Relevant Medications   lisinopril-hydrochlorothiazide (ZESTORETIC) 20-12.5 MG tablet   Other Relevant Orders   Basic metabolic panel    Other Visit Diagnoses    Encounter for immunization       Relevant Orders   PFIZER Comirnaty(GRAY TOP)COVID-19 Vaccine (Completed)   Need for pneumococcal vaccination       Relevant Orders   Pneumococcal polysaccharide vaccine 23-valent greater than or equal to 2yo subcutaneous/IM (Completed)      Return in about 3 months (around 07/17/2020).   I spent 25 minutes with the patient in direct care/counsel and in coordination efforts to fill her anti retrovirals.    [name], MSN, NP-C [facility] for Infectious [place] Pager: [phone] Office: [phone]  04/17/20  2:16 PM

## 2020-04-17 ENCOUNTER — Encounter: Payer: Self-pay | Admitting: Infectious Diseases

## 2020-04-17 ENCOUNTER — Other Ambulatory Visit: Payer: Self-pay

## 2020-04-17 ENCOUNTER — Ambulatory Visit (INDEPENDENT_AMBULATORY_CARE_PROVIDER_SITE_OTHER): Payer: 59 | Admitting: Infectious Diseases

## 2020-04-17 VITALS — Wt 137.0 lb

## 2020-04-17 DIAGNOSIS — M25522 Pain in left elbow: Secondary | ICD-10-CM

## 2020-04-17 DIAGNOSIS — Z23 Encounter for immunization: Secondary | ICD-10-CM

## 2020-04-17 DIAGNOSIS — I10 Essential (primary) hypertension: Secondary | ICD-10-CM | POA: Diagnosis not present

## 2020-04-17 DIAGNOSIS — B20 Human immunodeficiency virus [HIV] disease: Secondary | ICD-10-CM | POA: Diagnosis not present

## 2020-04-17 DIAGNOSIS — R600 Localized edema: Secondary | ICD-10-CM | POA: Diagnosis not present

## 2020-04-17 DIAGNOSIS — M25422 Effusion, left elbow: Secondary | ICD-10-CM

## 2020-04-17 MED ORDER — LISINOPRIL-HYDROCHLOROTHIAZIDE 20-12.5 MG PO TABS: 1.0000 | ORAL_TABLET | Freq: Every day | ORAL | 3 refills | Status: DC

## 2020-04-17 NOTE — Assessment & Plan Note (Addendum)
We spent time reviewing her historical labs today and highlighted the fact that she after just being off medicine for a few months had a significant increase in her viral load reduction in her CD4 count.  Reinforced today that she really should not stop this medication to give her body time to recover.  We discussed that it will probably take a few years for her immune system to fully rebuild.  CD4 count dropped to 60 from 140. Sounds like the pharmacy team assisted her with co-pay assistance.  They are very thankful for this. Pfizer booster and Pneumovax given today. We will have her back between 2 and 4 months depending on how soon she can see her PCP to follow-up on other changes today.  We can do labs prior to a 31-month visit but not necessary at 2.

## 2020-04-17 NOTE — Assessment & Plan Note (Signed)
Inner medial elbow she is having pain and what she perceives as swelling.  On exam it looks pretty similar in size to the right arm.  While there is no erythema, cording, induration or fluctuance she is tender overlying one of the biceps tendons.  I will treat conservatively with ice and rest.  I asked him to please call me if this increases in size and can consider ordering an ultrasound.

## 2020-04-17 NOTE — Assessment & Plan Note (Signed)
Counseled today on continuing the [place] and Bactrim together until her immune system which is a level over 200 for 3 months.  Most recent count was 60.  No AIDS defining illnesses or opportunistic infections noted on exam today.

## 2020-04-17 NOTE — Assessment & Plan Note (Signed)
New problem for [facility].  I suspect is due to the amlodipine as a side effect of the medication.  I will stop this.  Refill sent in for her lisinopril HCTZ combo pill 20-12.5 mg.  Advised to take this in the morning as it will increase her urination.

## 2020-04-17 NOTE — Patient Instructions (Addendum)
Please schedule an appointment with me in 3-4 months with lab work again prior to this appointment.   I can see you sooner to recheck blood pressure and the swelling in your arm if you have trouble seeing Dr. Justin Mend in 2 months.   Please call your pharmacy to refill the Bactrim antibiotic  Please continue taking the biktarvy every single day also.   Please call me if the swelling on the left arm gets worse - I will order an ultrasound for you.   STOP the Amlodipine for your blood pressure. This is likely causing the swelling in your feet. This should improve over the next week.   START the Zestoric --> once daily in the morning. It will make you pee more for the first half of the day.

## 2020-04-17 NOTE — Assessment & Plan Note (Signed)
Stop amlodipine.  Start Zestoretic 20-12.5 mg tab once daily. FU with PCP in 2 months

## 2020-04-17 NOTE — Progress Notes (Signed)
   Covid-19 Vaccination Clinic  Name:  Sandra [name]    MRN: [id number] DOB: 07/31/1955  04/17/2020  [name] was observed post Covid-19 immunization for 15 minutes without incident. She was provided with Vaccine Information Sheet and instruction to access the V-Safe system.   [name] was instructed to call 911 with any severe reactions post vaccine: Marland Kitchen Difficulty breathing  . Swelling of face and throat  . A fast heartbeat  . A bad rash all over body  . Dizziness and weakness     Landis Gandy, RN

## 2020-07-17 ENCOUNTER — Other Ambulatory Visit: Payer: 59

## 2020-07-17 ENCOUNTER — Other Ambulatory Visit: Payer: Self-pay

## 2020-07-17 DIAGNOSIS — I10 Essential (primary) hypertension: Secondary | ICD-10-CM

## 2020-07-17 DIAGNOSIS — B20 Human immunodeficiency virus [HIV] disease: Secondary | ICD-10-CM

## 2020-07-18 LAB — T-HELPER CELL (CD4) - (RCID CLINIC ONLY)
CD4 % Helper T Cell: 5 % — ABNORMAL LOW (ref 33–65)
CD4 T Cell Abs: 165 /uL — ABNORMAL LOW (ref 400–1790)

## 2020-07-19 LAB — BASIC METABOLIC PANEL
BUN: 14 mg/dL (ref 7–25)
CO2: 27 mmol/L (ref 20–32)
Calcium: 9.7 mg/dL (ref 8.6–10.4)
Chloride: 102 mmol/L (ref 98–110)
Creat: 1.03 mg/dL (ref 0.50–1.05)
Glucose, Bld: 112 mg/dL — ABNORMAL HIGH (ref 65–99)
Potassium: 3.8 mmol/L (ref 3.5–5.3)
Sodium: 138 mmol/L (ref 135–146)

## 2020-07-19 LAB — HIV-1 RNA QUANT-NO REFLEX-BLD
HIV 1 RNA Quant: <20 Copies/mL — ABNORMAL HIGH
HIV-1 RNA Quant, Log: <1.3 Log cps/mL — ABNORMAL HIGH

## 2020-07-31 ENCOUNTER — Other Ambulatory Visit (HOSPITAL_COMMUNITY): Payer: Self-pay

## 2020-07-31 ENCOUNTER — Encounter: Payer: Self-pay | Admitting: Infectious Diseases

## 2020-07-31 ENCOUNTER — Other Ambulatory Visit: Payer: Self-pay

## 2020-07-31 ENCOUNTER — Ambulatory Visit: Payer: 59

## 2020-07-31 ENCOUNTER — Ambulatory Visit (INDEPENDENT_AMBULATORY_CARE_PROVIDER_SITE_OTHER): Payer: 59

## 2020-07-31 ENCOUNTER — Ambulatory Visit (INDEPENDENT_AMBULATORY_CARE_PROVIDER_SITE_OTHER): Payer: 59 | Admitting: Infectious Diseases

## 2020-07-31 VITALS — BP 212/101 | HR 65 | Temp 98.3°F | Wt 136.6 lb

## 2020-07-31 DIAGNOSIS — B20 Human immunodeficiency virus [HIV] disease: Secondary | ICD-10-CM

## 2020-07-31 DIAGNOSIS — I63331 Cerebral infarction due to thrombosis of right posterior cerebral artery: Secondary | ICD-10-CM

## 2020-07-31 DIAGNOSIS — E785 Hyperlipidemia, unspecified: Secondary | ICD-10-CM

## 2020-07-31 DIAGNOSIS — Z23 Encounter for immunization: Secondary | ICD-10-CM

## 2020-07-31 DIAGNOSIS — I10 Essential (primary) hypertension: Secondary | ICD-10-CM | POA: Diagnosis not present

## 2020-07-31 MED ORDER — SULFAMETHOXAZOLE-TRIMETHOPRIM 800-160 MG PO TABS: 1.0000 | ORAL_TABLET | Freq: Every day | ORAL | 5 refills | Status: DC

## 2020-07-31 MED ORDER — AMLODIPINE BESYLATE 10 MG PO TABS: 10.0000 mg | ORAL_TABLET | Freq: Every day | ORAL | 1 refills | Status: DC

## 2020-07-31 MED ORDER — LISINOPRIL-HYDROCHLOROTHIAZIDE 20-12.5 MG PO TABS: 1.0000 | ORAL_TABLET | Freq: Every day | ORAL | 1 refills | Status: DC

## 2020-07-31 MED ORDER — SIMVASTATIN 40 MG PO TABS: 40.0000 mg | ORAL_TABLET | Freq: Every day | ORAL | 1 refills | Status: DC

## 2020-07-31 MED ORDER — BIKTARVY 50-200-25 MG PO TABS: 1.0000 | ORAL_TABLET | Freq: Every day | ORAL | 11 refills | Status: DC

## 2020-07-31 NOTE — Assessment & Plan Note (Signed)
Recent lab work reveals well-controlled HIV with viral load undetectable.  She is done a much better job getting her medications regularly.  I sent in refills for her and asked her to set up mail order shipments with [place].  She needs to meet with our financial team to renew her ADAP today.   Return in about 3 months (around 10/31/2020).

## 2020-07-31 NOTE — Assessment & Plan Note (Addendum)
No findings concerning for opportunistic infection on exam.  We reviewed her historical and recent lab work and highlighted that her CD4 count has shown some signs of recovery but she has been back on her [place] consistently.  Most recent value of 168.  I want her to continue the Bactrim 1 double strength tablet daily for now.  Anticipate she will need this for the next 6 months. Refills provided

## 2020-07-31 NOTE — Progress Notes (Signed)
   Covid-19 Vaccination Clinic  Name:  Sandra [name]    MRN: [id number] DOB: 08/18/1955  07/31/2020  [name] was observed post Covid-19 immunization for 15 minutes without incident. She was provided with Vaccine Information Sheet and instruction to access the V-Safe system.   [name] was instructed to call 911 with any severe reactions post vaccine: Difficulty breathing  Swelling of face and throat  A fast heartbeat  A bad rash all over body  Dizziness and weakness   Immunizations Administered     Name Date Dose VIS Date Route   PFIZER Comrnaty(Gray TOP) Covid-19 Vaccine 07/31/2020 12:00 PM 0.3 mL 12/15/2019 Intramuscular   Manufacturer: [place]   Lot: [id number]   NDC: Leola Jordyan Hardiman, CMA

## 2020-07-31 NOTE — Progress Notes (Signed)
Name: Marcia Marotz  DOB: [birth year] MRN: [id number] PCP: Maurice Small, MD    Subjective:   Chief Complaint  Patient presents with   Follow-up    B20       HPI: Geneta Mudge is a 65 y.o. 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  She is here with her daughter and grandson.  She tells me that she is done very well taking her Biktarvy every day without missed a single dose.  She is taking pills directly from all the bottles and does not use any tray getting them.  She is been out of her blood pressure medication for few weeks now -has noticed some intermittent headaches that usually spontaneously resolved.  She does not currently have one today.  No changes to her vision.  Also not taking her prescribed statin.  She has not had follow-up with her primary care provider Dr. Justin Mend, her daughter tells me that a barrier is a distance to the appointment.   Reports no complaints today suggestive of associated opportunistic infection or advancing HIV disease such as fevers, night sweats, weight loss, anorexia, cough, SOB, nausea, vomiting, diarrhea, sensory changes, lymphadenopathy or oral thrush.   She has not had her fourth COVID booster yet.    Review of Systems  Constitutional:  Negative for chills, fever, malaise/fatigue and weight loss.  HENT:  Negative for sore throat.   Respiratory:  Negative for cough, sputum production and shortness of breath.   Cardiovascular: Negative.   Gastrointestinal:  Negative for abdominal pain, diarrhea and vomiting.  Musculoskeletal:  Negative for joint pain, myalgias and neck pain.  Skin:  Negative for rash.  Neurological:  Positive for headaches. Negative for tingling and focal weakness.  Psychiatric/Behavioral:  Negative for depression and substance abuse. The patient is not nervous/anxious.     Past Medical History:  Diagnosis Date   Arthritis     Headache    HIV (human immunodeficiency virus infection) ([place]) 01/19/2019   Hypertension    Stroke [facility])     Outpatient Medications Prior to Visit  Medication Sig Dispense Refill   bictegravir-emtricitabine-tenofovir AF (BIKTARVY) 50-200-25 MG TABS tablet Take 1 tablet by mouth daily. 30 tablet 11   amLODipine (NORVASC) 10 MG tablet Take 10 mg by mouth daily.     lisinopril-hydrochlorothiazide (ZESTORETIC) 20-12.5 MG tablet Take 1 tablet by mouth daily. (Patient not taking: Reported on 07/31/2020) 30 tablet 3   simvastatin (ZOCOR) 40 MG tablet Take 1 tablet (40 mg total) by mouth daily at 6 PM. (Patient not taking: Reported on 07/31/2020) 30 tablet 11   sulfamethoxazole-trimethoprim (BACTRIM DS) 800-160 MG tablet Take 1 tablet by mouth daily.     No facility-administered medications prior to visit.     No Known Allergies   Social History   Tobacco Use   Smoking status: Never   Smokeless tobacco: Never  Vaping Use   Vaping Use: Never used  Substance Use Topics   Alcohol use: Never   Drug use: Never     Social History   Substance and Sexual Activity  Sexual Activity Not on file     Objective:   Vitals:   07/31/20 1117  BP: (!) 212/101  Pulse: 65  Temp: 98.3 F (36.8 C)  TempSrc: Oral  Weight: 136 lb 9.6 oz (62 kg)   Body mass index is 25.81 kg/m.   Physical Exam HENT:     Mouth/Throat:  Mouth: No oral lesions.     Dentition: No dental abscesses.  Cardiovascular:     Rate and Rhythm: Normal rate and regular rhythm.     Heart sounds: Normal heart sounds.  Pulmonary:     Effort: Pulmonary effort is normal.     Breath sounds: Normal breath sounds.  Abdominal:     General: There is no distension.     Palpations: Abdomen is soft.     Tenderness: There is no abdominal tenderness.  Musculoskeletal:        General: No tenderness. Normal range of motion.  Lymphadenopathy:     Cervical: No cervical adenopathy.  Skin:    General: Skin is warm and dry.      Findings: No rash.  Neurological:     Mental Status: She is alert and oriented to person, place, and time.  Psychiatric:        Judgment: Judgment normal.    Lab Results Lab Results  Component Value Date   WBC 6.3 02/02/2020   HGB 13.3 02/02/2020   HCT 39.2 02/02/2020   MCV 90.1 02/02/2020   PLT 187 02/02/2020    Lab Results  Component Value Date   CREATININE 1.03 07/17/2020   BUN 14 07/17/2020   NA 138 07/17/2020   K 3.8 07/17/2020   CL 102 07/17/2020   CO2 27 07/17/2020    Lab Results  Component Value Date   ALT 16 02/02/2020   AST 20 02/02/2020   ALKPHOS 56 11/25/2017   BILITOT 0.7 02/02/2020    Lab Results  Component Value Date   CHOL 166 02/02/2020   HDL 35 (L) 02/02/2020   LDLCALC 100 (H) 02/02/2020   TRIG 192 (H) 02/02/2020   CHOLHDL 4.7 02/02/2020   HIV 1 RNA Quant (Copies/mL)  Date Value  07/17/2020 <20 (H)  04/03/2020 95 (H)  02/02/2020 92,500 (H)   CD4 T Cell Abs (/uL)  Date Value  07/17/2020 165 (L)  02/02/2020 68 (L)  09/27/2019 118 (L)     Assessment & Plan:   Patient Active Problem List   Diagnosis Date Noted   HIV (human immunodeficiency virus infection) ([place]) 01/19/2019   AIDS (acquired immune deficiency syndrome) ([place]) 11/30/2017   Pedal edema 04/17/2020   Chronic pain of left knee 05/16/2019   CVA (cerebral vascular accident) ([place]) 11/25/2017   Stroke ([place]) 12/08/2014   Left-sided weakness    HLD (hyperlipidemia)    Benign essential HTN    Cerebrovascular accident (CVA) due to occlusion of cerebral artery ([place])     Problem List Items Addressed This Visit       High   HIV (human immunodeficiency virus infection) ([place]) (Chronic)    Recent lab work reveals well-controlled HIV with viral load undetectable.  She is done a much better job getting her medications regularly.  I sent in refills for her and asked her to set up mail order shipments with [facility].  She needs to meet with our financial team to renew her ADAP  today.   Return in about 3 months (around 10/31/2020).        Relevant Medications   sulfamethoxazole-trimethoprim (BACTRIM DS) 800-160 MG tablet   AIDS (acquired immune deficiency syndrome) ([place]) - Primary    No findings concerning for opportunistic infection on exam.  We reviewed her historical and recent lab work and highlighted that her CD4 count has shown some signs of recovery but she has been back on her [place] consistently.  Most recent value  of 168.  I want her to continue the Bactrim 1 double strength tablet daily for now.  Anticipate she will need this for the next 6 months. Refills provided       Relevant Medications   sulfamethoxazole-trimethoprim (BACTRIM DS) 800-160 MG tablet   Other Relevant Orders   HIV-1 RNA quant-no reflex-bld   T-helper cells (CD4) count   Basic metabolic panel     Unprioritized   HLD (hyperlipidemia)    Stroke history.  Refill provided on her statin provided today until with new PCP appt. discussed the importance of continuing this medication in the setting of her previous stroke history.  Most recent lipid panel reviewed with LDL 100.  She has had some interruptions in doses due to no follow-up with her PCP to refill.  Not to make any adjustments today and will continue current dose statin.  Lab Results  Component Value Date   CHOL 166 02/02/2020   HDL 35 (L) 02/02/2020   LDLCALC 100 (H) 02/02/2020   TRIG 192 (H) 02/02/2020   CHOLHDL 4.7 02/02/2020         Relevant Medications   lisinopril-hydrochlorothiazide (ZESTORETIC) 20-12.5 MG tablet   simvastatin (ZOCOR) 40 MG tablet   amLODipine (NORVASC) 10 MG tablet   CVA (cerebral vascular accident) ([place])   Relevant Medications   lisinopril-hydrochlorothiazide (ZESTORETIC) 20-12.5 MG tablet   simvastatin (ZOCOR) 40 MG tablet   amLODipine (NORVASC) 10 MG tablet   Benign essential HTN    BP Readings from Last 3 Encounters:  07/31/20 (!) 212/101  02/14/20 (!) 169/73  12/07/19 (!)  146/77  Blood pressure is consistently out of control.  She is not symptomatic for this today and I do not believe she needs an ER referral to manage BP currently.  I stopped her amlodipine previously due to lower extremity edema.  Would like to retrial this for her given her blood pressure is profoundly out-of-control.   She has been off of her lisinopril-HCTZ combination for least a week from what I can tell, but probably not enough to control her with just this tablet. I we will refill her amlodipine and lisinorpril-hctz for now and help refer her to internal medicine clinic her locally for ongoing treatment.    For future prescribers she has free access to the following medications per the [facility] program formulary.  Medicines need to be sent to [facility] on [address] in 30-day supplies only.         Relevant Medications   lisinopril-hydrochlorothiazide (ZESTORETIC) 20-12.5 MG tablet   simvastatin (ZOCOR) 40 MG tablet   amLODipine (NORVASC) 10 MG tablet   Return in about 3 months (around 10/31/2020).     [name], MSN, NP-C [facility] for Infectious [place] Pager: [phone] Office: [phone]  07/31/20  2:05 PM

## 2020-07-31 NOTE — Assessment & Plan Note (Signed)
Stroke history.  Refill provided on her statin provided today until with new PCP appt. discussed the importance of continuing this medication in the setting of her previous stroke history.  Most recent lipid panel reviewed with LDL 100.  She has had some interruptions in doses due to no follow-up with her PCP to refill.  Not to make any adjustments today and will continue current dose statin.  Lab Results  Component Value Date   CHOL 166 02/02/2020   HDL 35 (L) 02/02/2020   LDLCALC 100 (H) 02/02/2020   TRIG 192 (H) 02/02/2020   CHOLHDL 4.7 02/02/2020

## 2020-07-31 NOTE — Patient Instructions (Addendum)
For primary care services, please call one of the following clinics for a new patient appointment:   Internal Medicine Clinic - ground floor of [facility]. [phone].   For your medicines we have to use the [facility] on [address] - when you pick up your medications today ask to set up for them all to be mailed to you monthly.   Please pick up your blood pressure medications and take both of them today. Your blood pressure needs to be much lower than what it is now.   Please come back in 3 months to check in.

## 2020-07-31 NOTE — Assessment & Plan Note (Signed)
BP Readings from Last 3 Encounters:  07/31/20 (!) 212/101  02/14/20 (!) 169/73  12/07/19 (!) 146/77   Blood pressure is consistently out of control.  She is not symptomatic for this today and I do not believe she needs an ER referral to manage BP currently.  I stopped her amlodipine previously due to lower extremity edema.  Would like to retrial this for her given her blood pressure is profoundly out-of-control.   She has been off of her lisinopril-HCTZ combination for least a week from what I can tell, but probably not enough to control her with just this tablet. I we will refill her amlodipine and lisinorpril-hctz for now and help refer her to internal medicine clinic her locally for ongoing treatment.    For future prescribers she has free access to the following medications per the [facility] program formulary.  Medicines need to be sent to [facility] on [address] in 30-day supplies only.

## 2020-08-01 ENCOUNTER — Encounter: Payer: Self-pay | Admitting: Student

## 2020-10-16 ENCOUNTER — Other Ambulatory Visit: Payer: 59

## 2020-10-16 ENCOUNTER — Other Ambulatory Visit: Payer: Self-pay

## 2020-10-16 DIAGNOSIS — B20 Human immunodeficiency virus [HIV] disease: Secondary | ICD-10-CM

## 2020-10-17 LAB — T-HELPER CELLS (CD4) COUNT (NOT AT ARMC)
CD4 % Helper T Cell: 5 % — ABNORMAL LOW (ref 33–65)
CD4 T Cell Abs: 137 /uL — ABNORMAL LOW (ref 400–1790)

## 2020-10-18 LAB — BASIC METABOLIC PANEL
BUN: 10 mg/dL (ref 7–25)
CO2: 31 mmol/L (ref 20–32)
Calcium: 9.6 mg/dL (ref 8.6–10.4)
Chloride: 101 mmol/L (ref 98–110)
Creat: 0.88 mg/dL (ref 0.50–1.05)
Glucose, Bld: 106 mg/dL — ABNORMAL HIGH (ref 65–99)
Potassium: 4.3 mmol/L (ref 3.5–5.3)
Sodium: 137 mmol/L (ref 135–146)

## 2020-10-18 LAB — HIV-1 RNA QUANT-NO REFLEX-BLD
HIV 1 RNA Quant: 21 Copies/mL — ABNORMAL HIGH
HIV-1 RNA Quant, Log: 1.32 Log cps/mL — ABNORMAL HIGH

## 2020-11-01 ENCOUNTER — Ambulatory Visit (INDEPENDENT_AMBULATORY_CARE_PROVIDER_SITE_OTHER): Payer: 59 | Admitting: Infectious Diseases

## 2020-11-01 ENCOUNTER — Other Ambulatory Visit: Payer: Self-pay

## 2020-11-01 ENCOUNTER — Other Ambulatory Visit (HOSPITAL_COMMUNITY): Payer: Self-pay

## 2020-11-01 ENCOUNTER — Encounter: Payer: Self-pay | Admitting: Infectious Diseases

## 2020-11-01 VITALS — BP 212/89 | HR 58 | Temp 97.1°F | Wt 136.0 lb

## 2020-11-01 DIAGNOSIS — E785 Hyperlipidemia, unspecified: Secondary | ICD-10-CM | POA: Diagnosis not present

## 2020-11-01 DIAGNOSIS — B20 Human immunodeficiency virus [HIV] disease: Secondary | ICD-10-CM | POA: Diagnosis not present

## 2020-11-01 DIAGNOSIS — Z23 Encounter for immunization: Secondary | ICD-10-CM | POA: Diagnosis not present

## 2020-11-01 DIAGNOSIS — I63331 Cerebral infarction due to thrombosis of right posterior cerebral artery: Secondary | ICD-10-CM

## 2020-11-01 DIAGNOSIS — I10 Essential (primary) hypertension: Secondary | ICD-10-CM | POA: Diagnosis not present

## 2020-11-01 MED ORDER — AMLODIPINE BESYLATE 10 MG PO TABS
10.0000 mg | ORAL_TABLET | Freq: Every day | ORAL | 5 refills | Status: DC
  Filled 2020-11-01: qty 30, 30d supply, fill #0

## 2020-11-01 MED ORDER — LISINOPRIL-HYDROCHLOROTHIAZIDE 20-12.5 MG PO TABS: 1.0000 | ORAL_TABLET | Freq: Every day | ORAL | 5 refills | Status: DC

## 2020-11-01 MED ORDER — LISINOPRIL-HYDROCHLOROTHIAZIDE 20-12.5 MG PO TABS
1.0000 | ORAL_TABLET | Freq: Every day | ORAL | 5 refills | Status: DC
Start: 2020-11-01 — End: 2020-11-01
  Filled 2020-11-01: qty 30, 30d supply, fill #0

## 2020-11-01 MED ORDER — AMLODIPINE BESYLATE 10 MG PO TABS: 10.0000 mg | ORAL_TABLET | Freq: Every day | ORAL | 5 refills | Status: DC

## 2020-11-01 MED ORDER — SULFAMETHOXAZOLE-TRIMETHOPRIM 400-80 MG PO TABS: 1.0000 | ORAL_TABLET | Freq: Every day | ORAL | 5 refills | Status: DC

## 2020-11-01 MED ORDER — SULFAMETHOXAZOLE-TRIMETHOPRIM 400-80 MG PO TABS
1.0000 | ORAL_TABLET | Freq: Every day | ORAL | 5 refills | Status: DC
  Filled 2020-11-01: qty 30, 30d supply, fill #0

## 2020-11-01 MED ORDER — SIMVASTATIN 40 MG PO TABS: 40.0000 mg | ORAL_TABLET | Freq: Every day | ORAL | 5 refills | Status: DC

## 2020-11-01 MED ORDER — SIMVASTATIN 40 MG PO TABS
40.0000 mg | ORAL_TABLET | Freq: Every day | ORAL | 5 refills | Status: DC
  Filled 2020-11-01: qty 30, 30d supply, fill #0

## 2020-11-01 MED ORDER — BIKTARVY 50-200-25 MG PO TABS
1.0000 | ORAL_TABLET | Freq: Every day | ORAL | 11 refills | Status: DC
  Filled 2020-11-01: qty 30, 30d supply, fill #0

## 2020-11-01 MED ORDER — BIKTARVY 50-200-25 MG PO TABS: 1.0000 | ORAL_TABLET | Freq: Every day | ORAL | 11 refills | Status: DC

## 2020-11-01 NOTE — Assessment & Plan Note (Signed)
She has not been taking bactrim based on fill history since July - will lower to single strength tab given she is also on ACEi for BP management to lower risk for hyperkalemia.  I asked her to please continue taking this everyday and reviewed CD4 counts and risk for opportunistic infections.

## 2020-11-01 NOTE — Progress Notes (Signed)
Name: Sandra [name]  DOB: 11-02-1955 MRN: [id number] PCP: Maurice Small, MD (Inactive)    Subjective:   Chief Complaint  Patient presents with   Follow-up    B20      HPI: Sandra [name] is a 65 y.o. 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  Getting medications from pharmacy regularly. VL < 20 and CD4 137 10-2020. She reports better adherence to Biktarvy and bactrim - though her daughter admits that she has   Has not picked up simvistatin, bactrim, or amlodipine since August/July. Just picked up lisinopril-hctz medication 10/4. Her daughter has trouble getting her medication regularly with being busy.   Reports no complaints today suggestive of associated opportunistic infection or advancing HIV disease such as fevers, night sweats, weight loss, anorexia, cough, SOB, nausea, vomiting, diarrhea, sensory changes, lymphadenopathy or oral thrush.   Has not had flu vaccine yet.     Review of Systems  Constitutional:  Negative for chills, fever, malaise/fatigue and weight loss.  HENT:  Negative for sore throat.   Respiratory:  Negative for cough, sputum production and shortness of breath.   Cardiovascular: Negative.   Gastrointestinal:  Negative for abdominal pain, diarrhea and vomiting.  Musculoskeletal:  Negative for joint pain, myalgias and neck pain.  Skin:  Negative for rash.  Neurological:  Negative for tingling, focal weakness and headaches.  Psychiatric/Behavioral:  Negative for depression and substance abuse. The patient is not nervous/anxious.     Past Medical History:  Diagnosis Date   Arthritis    Headache    HIV (human immunodeficiency virus infection) ([place]) 01/19/2019   Hypertension    Stroke [facility])     Outpatient Medications Prior to Visit  Medication Sig Dispense Refill   amLODipine (NORVASC) 10 MG tablet Take 1 tablet (10 mg total) by mouth daily. 30 tablet 1    bictegravir-emtricitabine-tenofovir AF (BIKTARVY) 50-200-25 MG TABS tablet Take 1 tablet by mouth daily. 30 tablet 11   lisinopril-hydrochlorothiazide (ZESTORETIC) 20-12.5 MG tablet Take 1 tablet by mouth daily. 30 tablet 1   simvastatin (ZOCOR) 40 MG tablet Take 1 tablet (40 mg total) by mouth daily at 6 PM. 30 tablet 1   sulfamethoxazole-trimethoprim (BACTRIM DS) 800-160 MG tablet Take 1 tablet by mouth daily. 30 tablet 5   No facility-administered medications prior to visit.     No Known Allergies   Social History   Tobacco Use   Smoking status: Never   Smokeless tobacco: Never  Vaping Use   Vaping Use: Never used  Substance Use Topics   Alcohol use: Never   Drug use: Never     Social History   Substance and Sexual Activity  Sexual Activity Not Currently     Objective:   Vitals:   11/01/20 0937  BP: (!) 212/89  Pulse: (!) 58  Temp: (!) 97.1 F (36.2 C)  TempSrc: Oral  Weight: 136 lb (61.7 kg)   Body mass index is 25.7 kg/m.   Physical Exam HENT:     Mouth/Throat:     Mouth: No oral lesions.     Dentition: No dental abscesses.  Cardiovascular:     Rate and Rhythm: Normal rate and regular rhythm.     Heart sounds: Normal heart sounds.  Pulmonary:     Effort: Pulmonary effort is normal.     Breath sounds: Normal breath sounds.  Abdominal:     General: There is no distension.  Palpations: Abdomen is soft.     Tenderness: There is no abdominal tenderness.  Musculoskeletal:        General: No tenderness. Normal range of motion.  Lymphadenopathy:     Cervical: No cervical adenopathy.  Skin:    General: Skin is warm and dry.     Findings: No rash.  Neurological:     Mental Status: She is alert and oriented to person, place, and time.  Psychiatric:        Judgment: Judgment normal.    Lab Results Lab Results  Component Value Date   WBC 6.3 02/02/2020   HGB 13.3 02/02/2020   HCT 39.2 02/02/2020   MCV 90.1 02/02/2020   PLT 187 02/02/2020     Lab Results  Component Value Date   CREATININE 0.88 10/16/2020   BUN 10 10/16/2020   NA 137 10/16/2020   K 4.3 10/16/2020   CL 101 10/16/2020   CO2 31 10/16/2020    Lab Results  Component Value Date   ALT 16 02/02/2020   AST 20 02/02/2020   ALKPHOS 56 11/25/2017   BILITOT 0.7 02/02/2020    Lab Results  Component Value Date   CHOL 166 02/02/2020   HDL 35 (L) 02/02/2020   LDLCALC 100 (H) 02/02/2020   TRIG 192 (H) 02/02/2020   CHOLHDL 4.7 02/02/2020   HIV 1 RNA Quant (Copies/mL)  Date Value  10/16/2020 21 (H)  07/17/2020 <20 (H)  04/03/2020 95 (H)   CD4 T Cell Abs (/uL)  Date Value  10/16/2020 137 (L)  07/17/2020 165 (L)  02/02/2020 68 (L)     Assessment & Plan:   Patient Active Problem List   Diagnosis Date Noted   HIV (human immunodeficiency virus infection) ([place]) 01/19/2019   AIDS (acquired immune deficiency syndrome) ([place]) 11/30/2017   Pedal edema 04/17/2020   Chronic pain of left knee 05/16/2019   CVA (cerebral vascular accident) ([place]) 11/25/2017   Stroke ([place]) 12/08/2014   Left-sided weakness    HLD (hyperlipidemia)    Essential hypertension    Cerebrovascular accident (CVA) due to occlusion of cerebral artery ([place])     Problem List Items Addressed This Visit       1.   HIV (human immunodeficiency virus infection) ([place]) (Chronic)    Viral load undetectable with labs recently and CD4 slowly increasing. Reviewed labs and answered all questions.  Flu shot given today. Needs pap smear but priority with BP management at the moment.  Not sexually active.  Continue biktarvy daily, return for care in 3 months for adherence / tolerance monitoring.       Relevant Medications   bictegravir-emtricitabine-tenofovir AF (BIKTARVY) 50-200-25 MG TABS tablet   sulfamethoxazole-trimethoprim (BACTRIM) 400-80 MG tablet   AIDS (acquired immune deficiency syndrome) ([place])    She has not been taking bactrim based on fill history since July - will lower to single  strength tab given she is also on ACEi for BP management to lower risk for hyperkalemia.  I asked her to please continue taking this everyday and reviewed CD4 counts and risk for opportunistic infections.       Relevant Medications   bictegravir-emtricitabine-tenofovir AF (BIKTARVY) 50-200-25 MG TABS tablet   sulfamethoxazole-trimethoprim (BACTRIM) 400-80 MG tablet   Other Relevant Orders   T-helper cell (CD4)- ([facility] clinic only)   HIV-1 RNA quant-no reflex-bld     Unprioritized   HLD (hyperlipidemia)    Target LDL 70 - has had interruptions in doses of cholesterol medication. Refills provided  of atorvastatin. Will need monitoring with amlodipine for statin s/e.       Relevant Medications   amLODipine (NORVASC) 10 MG tablet   lisinopril-hydrochlorothiazide (ZESTORETIC) 20-12.5 MG tablet   simvastatin (ZOCOR) 40 MG tablet   Other Relevant Orders   Lipid panel   Essential hypertension    BP Readings from Last 3 Encounters:  11/01/20 (!) 212/89  07/31/20 (!) 212/101  02/14/20 (!) 169/73  Completely out of control. We reviewed fill history today - looks like she has been off bp meds and cholesterol meds for 3 months. She has not been on amlodipine - will have her resume this with lisiopril-hctz. Will arrange for all meds to be mailed to them via specialty pharmacy. Unable to use [facility] unfortunately.  We also reviewed importance to help her get to PCP for management of this very important medical problem. I have asked her daughter to please call to schedule a new patient appointment today.       Relevant Medications   amLODipine (NORVASC) 10 MG tablet   lisinopril-hydrochlorothiazide (ZESTORETIC) 20-12.5 MG tablet   simvastatin (ZOCOR) 40 MG tablet   CVA (cerebral vascular accident) ([place])   Relevant Medications   amLODipine (NORVASC) 10 MG tablet   lisinopril-hydrochlorothiazide (ZESTORETIC) 20-12.5 MG tablet   simvastatin (ZOCOR) 40 MG tablet  Return in about 3 months (around  02/01/2021).     [name], MSN, NP-C [facility] for Infectious [place] Pager: [phone] Office: [phone]  11/01/20  10:14 AM

## 2020-11-01 NOTE — Assessment & Plan Note (Signed)
Viral load undetectable with labs recently and CD4 slowly increasing. Reviewed labs and answered all questions.  Flu shot given today. Needs pap smear but priority with BP management at the moment.  Not sexually active.  Continue biktarvy daily, return for care in 3 months for adherence / tolerance monitoring.

## 2020-11-01 NOTE — Patient Instructions (Addendum)
Nice to see you - please continue your Biktarvy and Bactrim every day.   For your blood pressure please take your Lisinopril-hctz combination pill AND your amlodipine pill every day.   For your cholesterol - please take your atorvastatin every day   For primary care services, please call one of the following clinics for a new patient appointment. We recommend you get a Primary Care Provider to best help with your blood pressure and overall wellness and disease prevention.   1. Internal Medicine Clinic - ground floor of [facility]. [phone]  2. [facility] [place]  [phone]  3. Patient [facility] in [place]  4. [facility] [address] [place],  [place]  [address]   [phone]    Please plan to take your blood pressure mediations before your next doctor's office so we can see if they are working and enough to control your blood pressure.   I would like to see you back here in 3 months with pre-visit labs 2 weeks before please.

## 2020-11-01 NOTE — Assessment & Plan Note (Addendum)
Target LDL 70 - has had interruptions in doses of cholesterol medication. Refills provided of atorvastatin. Will need monitoring with amlodipine for statin s/e.

## 2020-11-01 NOTE — Assessment & Plan Note (Signed)
BP Readings from Last 3 Encounters:  11/01/20 (!) 212/89  07/31/20 (!) 212/101  02/14/20 (!) 169/73   Completely out of control. We reviewed fill history today - looks like she has been off bp meds and cholesterol meds for 3 months. She has not been on amlodipine - will have her resume this with lisiopril-hctz. Will arrange for all meds to be mailed to them via specialty pharmacy. Unable to use [facility] unfortunately.  We also reviewed importance to help her get to PCP for management of this very important medical problem. I have asked her daughter to please call to schedule a new patient appointment today.

## 2020-12-22 ENCOUNTER — Encounter (HOSPITAL_COMMUNITY): Payer: Self-pay | Admitting: *Deleted

## 2020-12-22 ENCOUNTER — Other Ambulatory Visit: Payer: Self-pay

## 2020-12-22 ENCOUNTER — Ambulatory Visit (HOSPITAL_COMMUNITY)
Admission: EM | Admit: 2020-12-22 | Discharge: 2020-12-22 | Disposition: A | Discharge status: Home / Self Care | Payer: 59 | Attending: Family Medicine | Admitting: Family Medicine

## 2020-12-22 DIAGNOSIS — B029 Zoster without complications: Secondary | ICD-10-CM | POA: Diagnosis not present

## 2020-12-22 MED ORDER — HYDROCODONE-ACETAMINOPHEN 5-325 MG PO TABS: ORAL_TABLET | ORAL | 0 refills | Status: DC

## 2020-12-22 MED ORDER — VALACYCLOVIR HCL 1 G PO TABS: 1000.0000 mg | ORAL_TABLET | Freq: Three times a day (TID) | ORAL | 0 refills | Status: DC

## 2020-12-22 NOTE — Discharge Instructions (Signed)
Be aware, you have been prescribed pain medications that may cause drowsiness. While taking this medication, do not take any other medications containing acetaminophen (Tylenol). Do not combine with alcohol or other illicit drugs. Please do not drive, operate heavy machinery, or take part in activities that require making important decisions while on this medication as your judgement may be clouded.  You may need to take a stool softener also as pain medications will cause constipation.

## 2020-12-22 NOTE — ED Triage Notes (Signed)
Pt presents with red fluid filled blisters on rt flank to ABD. Pain 9/10. Sx's started 3 days ago.

## 2020-12-22 NOTE — ED Provider Notes (Signed)
[place]   [id number] 12/22/20 Arrival Time: 1228  ASSESSMENT & PLAN:  1. Herpes zoster without complication    See AVS for discharge information.  New Prescriptions   HYDROCODONE-ACETAMINOPHEN (NORCO/VICODIN) 5-325 MG TABLET    Take 1/2 to 1 tablet every 6 hours as needed for pain.   VALACYCLOVIR (VALTREX) 1000 MG TABLET    Take 1 tablet (1,000 mg total) by mouth 3 (three) times daily.   No signs of skin infection.    Discharge Instructions      Be aware, you have been prescribed pain medications that may cause drowsiness. While taking this medication, do not take any other medications containing acetaminophen (Tylenol). Do not combine with alcohol or other illicit drugs. Please do not drive, operate heavy machinery, or take part in activities that require making important decisions while on this medication as your judgement may be clouded.  You may need to take a stool softener also as pain medications will cause constipation.    Will follow up with PCP or here if worsening or failing to improve as anticipated. Reviewed expectations re: course of current medical issues. Questions answered. Outlined signs and symptoms indicating need for more acute intervention. Patient verbalized understanding. After Visit Summary given.   SUBJECTIVE:  Sandra [name] is a 65 y.o. female who presents with a skin complaint. R side of abdomen; painful red rash; x 2-3 d. Afebrile. Normal PO intake without n/v/d.   OBJECTIVE: Vitals:   12/22/20 1249  BP: (!) 196/93  Pulse: 72  Resp: 20  Temp: 99 F (37.2 C)  SpO2: 97%    General appearance: alert; no distress; appears to be uncomfortable HEENT: [place]; AT Neck: supple with FROM Lungs: clear to auscultation bilaterally Extremities: no edema; moves all extremities normally Skin: warm and dry; signs of infection: no; crops of bright red/purplish papules over R abdomen  Psychological: alert and cooperative; normal mood and  affect  No Known Allergies  Past Medical History:  Diagnosis Date   Arthritis    Headache    HIV (human immunodeficiency virus infection) ([place]) 01/19/2019   Hypertension    Stroke [facility])    Social History   Socioeconomic History   Marital status: Single    Spouse name: Not on file   Number of children: Not on file   Years of education: Not on file   Highest education level: Not on file  Occupational History   Not on file  Tobacco Use   Smoking status: Never   Smokeless tobacco: Never  Vaping Use   Vaping Use: Never used  Substance and Sexual Activity   Alcohol use: Never   Drug use: Never   Sexual activity: Not Currently  Other Topics Concern   Not on file  Social History Narrative   ** Merged History Encounter **       Social Determinants of Health   Financial Resource Strain: Not on file  Food Insecurity: Not on file  Transportation Needs: Not on file  Physical Activity: Not on file  Stress: Not on file  Social Connections: Not on file  Intimate Partner Violence: Not on file   Family History  Problem Relation Age of Onset   Colon cancer Neg Hx    Esophageal cancer Neg Hx    Rectal cancer Neg Hx    Stomach cancer Neg Hx    Past Surgical History:  Procedure Laterality Date   EYE SURGERY        Vanessa Kick, MD 12/22/20  1354 ° °

## 2021-01-24 ENCOUNTER — Ambulatory Visit: Payer: 59 | Admitting: Infectious Diseases

## 2021-02-11 ENCOUNTER — Encounter: Payer: Self-pay | Admitting: Infectious Diseases

## 2021-02-11 ENCOUNTER — Other Ambulatory Visit (HOSPITAL_COMMUNITY): Payer: Self-pay

## 2021-02-11 ENCOUNTER — Ambulatory Visit (INDEPENDENT_AMBULATORY_CARE_PROVIDER_SITE_OTHER): Payer: 59 | Admitting: Infectious Diseases

## 2021-02-11 ENCOUNTER — Other Ambulatory Visit: Payer: Self-pay

## 2021-02-11 DIAGNOSIS — B20 Human immunodeficiency virus [HIV] disease: Secondary | ICD-10-CM

## 2021-02-11 DIAGNOSIS — I10 Essential (primary) hypertension: Secondary | ICD-10-CM | POA: Diagnosis not present

## 2021-02-11 DIAGNOSIS — E785 Hyperlipidemia, unspecified: Secondary | ICD-10-CM

## 2021-02-11 MED ORDER — LISINOPRIL-HYDROCHLOROTHIAZIDE 20-12.5 MG PO TABS: 1.0000 | ORAL_TABLET | Freq: Every day | ORAL | 0 refills | Status: DC

## 2021-02-11 MED ORDER — SULFAMETHOXAZOLE-TRIMETHOPRIM 400-80 MG PO TABS: 1.0000 | ORAL_TABLET | Freq: Every day | ORAL | 5 refills | Status: DC

## 2021-02-11 MED ORDER — AMLODIPINE BESYLATE 10 MG PO TABS: 10.0000 mg | ORAL_TABLET | Freq: Every day | ORAL | 0 refills | Status: DC

## 2021-02-11 NOTE — Patient Instructions (Addendum)
So nice to see you!  Please check with our financial team before you leave about biktarvy and not getting it.   SHINGRIX is the vaccine that I want you to get for preventing shingles from coming back.   Blood pressure is still too high - I want to see it less than 130/80.   Will check your diabetes test today and some other blood work for me and your next doctor's appointment.  Stop by the lab on your way out.

## 2021-02-11 NOTE — Progress Notes (Signed)
Name: Sandra [name]  DOB: February 11, 1955 MRN: [id number] PCP: Maurice Small, MD    Subjective:   CC: Routine HIV follow up care.     HPI: Abbey Veith is a 66 y.o. 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  Her daughter is here today as a Twi interpretor was not available for the visit.   ER visit in December for Shingles outbreak, treated with valtrex and pain medications.   She has not had Biktarvy in a month. Something with her insurance to sort out from what he daughter tells me. Has a PCP appointment coming up on February 14th with [facility]. She has not had access to her blood pressure medications either - stopped sending them.   Denies any headaches, vision changes. Overall reports she is feeling well.     Review of Systems  Constitutional:  Negative for chills, fever, malaise/fatigue and weight loss.  HENT:  Negative for sore throat.   Respiratory:  Negative for cough, sputum production and shortness of breath.   Cardiovascular: Negative.   Gastrointestinal:  Negative for abdominal pain, diarrhea and vomiting.  Musculoskeletal:  Negative for joint pain, myalgias and neck pain.  Skin:  Negative for rash.  Neurological:  Negative for tingling, focal weakness and headaches.  Psychiatric/Behavioral:  Negative for depression and substance abuse. The patient is not nervous/anxious.     Past Medical History:  Diagnosis Date   Arthritis    Headache    HIV (human immunodeficiency virus infection) ([place]) 01/19/2019   Hypertension    Stroke [facility])     Outpatient Medications Prior to Visit  Medication Sig Dispense Refill   bictegravir-emtricitabine-tenofovir AF (BIKTARVY) 50-200-25 MG TABS tablet Take 1 tablet by mouth daily. 30 tablet 11   HYDROcodone-acetaminophen (NORCO/VICODIN) 5-325 MG tablet Take 1/2 to 1 tablet every 6 hours as needed for pain. 20 tablet 0    simvastatin (ZOCOR) 40 MG tablet Take 1 tablet (40 mg total) by mouth daily at 6 PM. 30 tablet 5   valACYclovir (VALTREX) 1000 MG tablet Take 1 tablet (1,000 mg total) by mouth 3 (three) times daily. 21 tablet 0   amLODipine (NORVASC) 10 MG tablet Take 1 tablet (10 mg total) by mouth daily. 30 tablet 5   lisinopril-hydrochlorothiazide (ZESTORETIC) 20-12.5 MG tablet Take 1 tablet by mouth daily. 30 tablet 5   sulfamethoxazole-trimethoprim (BACTRIM) 400-80 MG tablet Take 1 tablet by mouth daily. 30 tablet 5   No facility-administered medications prior to visit.     No Known Allergies   Social History   Tobacco Use   Smoking status: Never   Smokeless tobacco: Never  Vaping Use   Vaping Use: Never used  Substance Use Topics   Alcohol use: Never   Drug use: Never     Social History   Substance and Sexual Activity  Sexual Activity Not Currently     Objective:   Vitals:   02/11/21 1101  BP: (!) 208/85  Pulse: 66  Temp: 98.1 F (36.7 C)  TempSrc: Oral  SpO2: 98%  Weight: 133 lb (60.3 kg)  Height: '4\' 9"'  (1.448 m)   Body mass index is 28.78 kg/m.   Physical Exam HENT:     Mouth/Throat:     Mouth: No oral lesions.     Dentition: No dental abscesses.  Cardiovascular:     Rate and Rhythm: Normal rate and regular rhythm.     Heart sounds:  Normal heart sounds.  Pulmonary:     Effort: Pulmonary effort is normal.     Breath sounds: Normal breath sounds.  Abdominal:     General: There is no distension.     Palpations: Abdomen is soft.     Tenderness: There is no abdominal tenderness.  Musculoskeletal:        General: No tenderness. Normal range of motion.  Lymphadenopathy:     Cervical: No cervical adenopathy.  Skin:    General: Skin is warm and dry.     Findings: No rash.  Neurological:     Mental Status: She is alert and oriented to person, place, and time.  Psychiatric:        Judgment: Judgment normal.    Lab Results Lab Results  Component Value Date    WBC 6.3 02/02/2020   HGB 13.3 02/02/2020   HCT 39.2 02/02/2020   MCV 90.1 02/02/2020   PLT 187 02/02/2020    Lab Results  Component Value Date   CREATININE 0.88 02/11/2021   BUN 12 02/11/2021   NA 138 02/11/2021   K 4.0 02/11/2021   CL 103 02/11/2021   CO2 28 02/11/2021    Lab Results  Component Value Date   ALT 13 02/11/2021   AST 18 02/11/2021   ALKPHOS 56 11/25/2017   BILITOT 0.8 02/11/2021    Lab Results  Component Value Date   CHOL 214 (H) 02/11/2021   HDL 38 (L) 02/11/2021   LDLCALC 144 (H) 02/11/2021   TRIG 185 (H) 02/11/2021   CHOLHDL 5.6 (H) 02/11/2021   HIV 1 RNA Quant (Copies/mL)  Date Value  10/16/2020 21 (H)  07/17/2020 <20 (H)  04/03/2020 95 (H)   CD4 T Cell Abs (/uL)  Date Value  10/16/2020 137 (L)  07/17/2020 165 (L)  02/02/2020 68 (L)     Assessment & Plan:   Patient Active Problem List   Diagnosis Date Noted   HIV (human immunodeficiency virus infection) ([place]) 01/19/2019   AIDS (acquired immune deficiency syndrome) ([place]) 11/30/2017   Pedal edema 04/17/2020   Chronic pain of left knee 05/16/2019   CVA (cerebral vascular accident) ([place]) 11/25/2017   Stroke ([place]) 12/08/2014   Left-sided weakness    HLD (hyperlipidemia)    Essential hypertension    Cerebrovascular accident (CVA) due to occlusion of cerebral artery ([place])     Problem List Items Addressed This Visit       High   HIV (human immunodeficiency virus infection) ([place]) (Chronic)    She has had multiple issues with getting her Biktarvy for various reasons. Shipping it had been helping her at one point and would like to continue this. Met with financial team and sorted out insurance problem - she can pick up [place] today from pharmacy.  Update VL and CD4 today.  Continue bactrim for prophylaxis (CD4 137)  Return in about 4 months (around 06/11/2021).       Relevant Medications   sulfamethoxazole-trimethoprim (BACTRIM) 400-80 MG tablet   AIDS (acquired immune deficiency  syndrome) ([facility])    No symptoms or findings to suggest OI today. Continue bactrim prophylaxis-- new rx sent in as she has not been getting this. I asked her to please pick this up locally.       Relevant Medications   sulfamethoxazole-trimethoprim (BACTRIM) 400-80 MG tablet   Other Relevant Orders   T-helper cells (CD4) count (Completed)     Unprioritized   HLD (hyperlipidemia)   Relevant Medications   lisinopril-hydrochlorothiazide (ZESTORETIC)  20-12.5 MG tablet   amLODipine (NORVASC) 10 MG tablet   Essential hypertension    BP Readings from Last 3 Encounters:  02/11/21 (!) 208/85  12/22/20 (!) 196/93  11/01/20 (!) 212/89  Unfortunately no progress here. Cannot use [facility] with ICAP unfortunately. She has a new patient visit scheduled next week with PCP - will send refill to local pharmacy for her daughter to pick up today. I asked them to be very intentional about giving her these medications so new provider can determine what next steps in her treatment are.   Screen A1C again today - previously pre diabetic @ 5.7%.  Needs to continue her statin therapy also with h/o CVA.       Relevant Medications   lisinopril-hydrochlorothiazide (ZESTORETIC) 20-12.5 MG tablet   amLODipine (NORVASC) 10 MG tablet   Other Visit Diagnoses     Benign essential HTN       Relevant Medications   lisinopril-hydrochlorothiazide (ZESTORETIC) 20-12.5 MG tablet   amLODipine (NORVASC) 10 MG tablet   Other Relevant Orders   Hemoglobin A1c (Completed)     Return in about 4 months (around 06/11/2021).     [name], MSN, NP-C [facility] for Infectious [place] Pager: [phone] Office: [phone]  02/12/21  1:18 PM

## 2021-02-12 ENCOUNTER — Other Ambulatory Visit: Payer: Self-pay

## 2021-02-12 DIAGNOSIS — B20 Human immunodeficiency virus [HIV] disease: Secondary | ICD-10-CM

## 2021-02-12 DIAGNOSIS — I10 Essential (primary) hypertension: Secondary | ICD-10-CM

## 2021-02-12 LAB — COMPREHENSIVE METABOLIC PANEL
AG Ratio: 1.2 (calc) (ref 1.0–2.5)
ALT: 13 U/L (ref 6–29)
AST: 18 U/L (ref 10–35)
Albumin: 4.4 g/dL (ref 3.6–5.1)
Alkaline phosphatase (APISO): 57 U/L (ref 37–153)
BUN: 12 mg/dL (ref 7–25)
CO2: 28 mmol/L (ref 20–32)
Calcium: 9.2 mg/dL (ref 8.6–10.4)
Chloride: 103 mmol/L (ref 98–110)
Creat: 0.88 mg/dL (ref 0.50–1.05)
Globulin: 3.8 g/dL (calc) — ABNORMAL HIGH (ref 1.9–3.7)
Glucose, Bld: 95 mg/dL (ref 65–99)
Potassium: 4 mmol/L (ref 3.5–5.3)
Sodium: 138 mmol/L (ref 135–146)
Total Bilirubin: 0.8 mg/dL (ref 0.2–1.2)
Total Protein: 8.2 g/dL — ABNORMAL HIGH (ref 6.1–8.1)

## 2021-02-12 LAB — LIPID PANEL
Cholesterol: 214 mg/dL — ABNORMAL HIGH (ref <200)
HDL: 38 mg/dL — ABNORMAL LOW (ref >=50)
LDL Cholesterol (Calc): 144 mg/dL (calc) — ABNORMAL HIGH
Non-HDL Cholesterol (Calc): 176 mg/dL (calc) — ABNORMAL HIGH (ref <130)
Total CHOL/HDL Ratio: 5.6 (calc) — ABNORMAL HIGH (ref <5.0)
Triglycerides: 185 mg/dL — ABNORMAL HIGH (ref <150)

## 2021-02-12 LAB — HEMOGLOBIN A1C
Hgb A1c MFr Bld: 6.1 % of total Hgb — ABNORMAL HIGH (ref <5.7)
Mean Plasma Glucose: 128 mg/dL
eAG (mmol/L): 7.1 mmol/L

## 2021-02-12 LAB — HIV-1 RNA QUANT-NO REFLEX-BLD
HIV 1 RNA Quant: 310000 Copies/mL — ABNORMAL HIGH
HIV-1 RNA Quant, Log: 5.49 Log cps/mL — ABNORMAL HIGH

## 2021-02-12 LAB — T-HELPER CELLS (CD4) COUNT (NOT AT ARMC)
Absolute CD4: 127 cells/uL — ABNORMAL LOW (ref 490–1740)
CD4 T Helper %: 5 % — ABNORMAL LOW (ref 30–61)
Total lymphocyte count: 2557 cells/uL (ref 850–3900)

## 2021-02-12 MED ORDER — BIKTARVY 50-200-25 MG PO TABS: 1.0000 | ORAL_TABLET | Freq: Every day | ORAL | 5 refills | Status: DC

## 2021-02-12 MED ORDER — LISINOPRIL-HYDROCHLOROTHIAZIDE 20-12.5 MG PO TABS: 1.0000 | ORAL_TABLET | Freq: Every day | ORAL | 0 refills | Status: DC

## 2021-02-12 MED ORDER — AMLODIPINE BESYLATE 10 MG PO TABS: 10.0000 mg | ORAL_TABLET | Freq: Every day | ORAL | 0 refills | Status: DC

## 2021-02-12 MED ORDER — SULFAMETHOXAZOLE-TRIMETHOPRIM 400-80 MG PO TABS: 1.0000 | ORAL_TABLET | Freq: Every day | ORAL | 5 refills | Status: DC

## 2021-02-12 NOTE — Assessment & Plan Note (Signed)
No symptoms or findings to suggest OI today. Continue bactrim prophylaxis-- new rx sent in as she has not been getting this. I asked her to please pick this up locally.

## 2021-02-12 NOTE — Assessment & Plan Note (Signed)
BP Readings from Last 3 Encounters:  02/11/21 (!) 208/85  12/22/20 (!) 196/93  11/01/20 (!) 212/89   Unfortunately no progress here. Cannot use WLOP with ICAP unfortunately. She has a new patient visit scheduled next week with PCP - will send refill to local pharmacy for her daughter to pick up today. I asked them to be very intentional about giving her these medications so new provider can determine what next steps in her treatment are.   Screen A1C again today - previously pre diabetic @ 5.7%.  Needs to continue her statin therapy also with h/o CVA.

## 2021-02-12 NOTE — Assessment & Plan Note (Signed)
She has had multiple issues with getting her Biktarvy for various reasons. Shipping it had been helping her at one point and would like to continue this. Met with financial team and sorted out insurance problem - she can pick up [place] today from pharmacy.  Update VL and CD4 today.  Continue bactrim for prophylaxis (CD4 137)  Return in about 4 months (around 06/11/2021).

## 2021-02-13 ENCOUNTER — Other Ambulatory Visit: Payer: Self-pay | Admitting: Infectious Diseases

## 2021-02-13 ENCOUNTER — Telehealth: Payer: Self-pay

## 2021-02-13 DIAGNOSIS — I10 Essential (primary) hypertension: Secondary | ICD-10-CM

## 2021-02-13 NOTE — Telephone Encounter (Signed)
-----   Message from [place] Callas, NP sent at 02/13/2021 10:23 AM EST ----- Looks like her A1C is up a little from 3 years ago at 6.1% which is consistent with pre-diabetes.   I would maybe offer to set up MyChart for her so we can have an easier time communicating with her and her daughter. Sandra [name] can set it up on her phone / computer to help her mom with communicating and seeing her lab results.   ~Thank you, Sandra [name]!   ----- Message ----- From: Sandra [name], CMA Sent: 02/13/2021  10:18 AM EST To: Bryant Callas, NP  Patient daughter aware of results. She asked about patient A1C result.  Also she is going to [facility] today to pick up medications.    ----- Message ----- From: [place] Callas, NP Sent: 02/13/2021  10:07 AM EST To: [facility] Triage Nurse Pool  Please give Sandra [name]'s daughter a call (I believe Sandra [name] is her name) to let her know that Sandra [name]'s viral load is very high - back up to 310,000. Her immune system is dropped back to about 120 again with this. Would like for her to take the Bactrim (antibiotic) once a day with her Biktarvy for certain to keep her from getting sick.   I would like to invite her back to repeat her blood work in 2 months with our pharmacy team and check in that she has access to her medication and is taking it everyday.   Thank you

## 2021-02-13 NOTE — Progress Notes (Signed)
Please give Aymar's daughter a call (I believe Estill Bamberg is her name) to let her know that Zerah's viral load is very high - back up to 310,000. Her immune system is dropped back to about 120 again with this. Would like for her to take the Bactrim (antibiotic) once a day with her Biktarvy for certain to keep her from getting sick.   I would like to invite her back to repeat her blood work in 2 months with our pharmacy team and check in that she has access to her medication and is taking it everyday.   Thank you

## 2021-02-13 NOTE — Telephone Encounter (Signed)
Patient daughter [name]) aware of results, appointment scheduled with pharmacy team and lab for follow up in April. [name] stated that she is going to pick up medications from [facility] today. My chart link sent to [facility] via text to set up my chart account.   [place], CMA

## 2021-02-18 NOTE — Progress Notes (Signed)
Erroneous encounter

## 2021-02-19 ENCOUNTER — Encounter: Payer: Self-pay | Admitting: Family

## 2021-02-19 DIAGNOSIS — Z789 Other specified health status: Secondary | ICD-10-CM

## 2021-02-19 DIAGNOSIS — Z7689 Persons encountering health services in other specified circumstances: Secondary | ICD-10-CM

## 2021-02-19 NOTE — Progress Notes (Signed)
Subjective:    Sandra [name] - 66 y.o. female MRN [id number]  Date of birth: Feb 02, 1955  HPI  Sandra [name] is to establish care. She is accompanied by her daughter.  Current issues and/or concerns: HYPERTENSION: 02/11/2021 at [facility] for Infectious Disease per NP note: Unfortunately no progress here. Cannot use WLOP with ICAP unfortunately. She has a new patient visit scheduled next week with PCP - will send refill to local pharmacy for her daughter to pick up today. I asked them to be very intentional about giving her these medications so new provider can determine what next steps in her treatment are.  Screen A1C again today - previously pre diabetic @ 5.7%.  Needs to continue her statin therapy also with h/o CVA.  Lisinopril-Hydrochlorothiazide Amlodipine   02/20/2021: Currently taking: see medication list Med Adherence: [x]  Yes    []  No Medication side effects: []  Yes    [x]  No Adherence with salt restriction (low-salt diet): [x]  Yes  []  No Exercise: Yes [x]  No []  Home Monitoring?: [x]  Yes    []  No Home BP results range: []  Yes    [x]  No Smoking []  Yes [x]  No SOB? []  Yes    [x]  No Chest Pain?: []  Yes    [x]  No  2. PREDIABETES: 6.1% on 02/11/2021.  3. HYPERLIPIDEMIA: Simvastatin, doing well no issues/concerns. Need refills.  ROS per HPI     Health Maintenance: Health Maintenance Due  Topic Date Due   TETANUS/TDAP  Never done   Zoster Vaccines- Shingrix (1 of 2) Never done   MAMMOGRAM  12/16/2019   DEXA SCAN  Never done   COVID-19 Vaccine (5 - Booster for [employer] series) 09/25/2020     Past Medical History: Patient Active Problem List   Diagnosis Date Noted   Pedal edema 04/17/2020   Chronic pain of left knee 05/16/2019   HIV (human immunodeficiency virus infection) ([place]) 01/19/2019   AIDS (acquired immune deficiency syndrome) ([place]) 11/30/2017   CVA (cerebral vascular accident) ([place]) 11/25/2017   Stroke ([place]) 12/08/2014   Left-sided  weakness    HLD (hyperlipidemia)    Essential hypertension    Cerebrovascular accident (CVA) due to occlusion of cerebral artery ([place])      Social History   reports that she has never smoked. She has never been exposed to tobacco smoke. She has never used smokeless tobacco. She reports that she does not drink alcohol and does not use drugs.   Family History  family history is not on file.   Medications: reviewed and updated   Objective:   Physical Exam BP (!) 149/68 (BP Location: Left Arm, Patient Position: Sitting, Cuff Size: Normal)    Pulse 68    Temp 98.5 F (36.9 C)    Ht 4' 9.01" (1.448 m)    Wt 130 lb (59 kg)    SpO2 98%    BMI 28.12 kg/m   Physical Exam HENT:     Head: Normocephalic and atraumatic.  Eyes:     Extraocular Movements: Extraocular movements intact.     Conjunctiva/sclera: Conjunctivae normal.     Pupils: Pupils are equal, round, and reactive to light.  Cardiovascular:     Rate and Rhythm: Normal rate and regular rhythm.     Pulses: Normal pulses.     Heart sounds: Normal heart sounds.  Pulmonary:     Effort: Pulmonary effort is normal.     Breath sounds: Normal breath sounds.  Musculoskeletal:     Cervical back:  Normal range of motion and neck supple.  Neurological:     General: No focal deficit present.     Mental Status: She is alert and oriented to person, place, and time.  Psychiatric:        Mood and Affect: Mood normal.        Behavior: Behavior normal.      Assessment & Plan:  1. Encounter to establish care: - Patient presents today to establish care.  - Return for annual physical examination, labs, and health maintenance. Arrive fasting meaning having no food for at least 8 hours prior to appointment. You may have only water or black coffee. Please take scheduled medications as normal.  2. Essential (primary) hypertension: - Continue Amlodipine and Lisinopril-Hydrochlorothiazide as prescribed. No refills needed as of present.  -  Counseled on blood pressure goal of less than 140/90, low-sodium, DASH diet, medication compliance, 150 minutes of moderate intensity exercise per week as tolerated. Discussed medication compliance, adverse effects. - Follow-up with primary provider in 3 months or sooner if needed.   3. Prediabetes: - Hemoglobin A1c 6.1% on 02/11/2021. - Discussed the importance of healthy eating habits, low-carbohydrate diet, low-sugar diet, and regular aerobic exercise (at least 150 minutes a week as tolerated) to achieve or maintain control of prediabetes. - Follow-up in 6 months or sooner if needed.   4. Hyperlipidemia, unspecified hyperlipidemia type: 5. History of CVA (cerebrovascular accident): -Practice low-fat heart healthy diet and at least 150 minutes of moderate intensity exercise weekly as tolerated.  - Continue Simvastatin as prescribed.  - Follow-up with primary provider as scheduled. - simvastatin (ZOCOR) 40 MG tablet; Take 1 tablet (40 mg total) by mouth daily at 6 PM.  Dispense: 120 tablet; Refill: 0  6. Language barrier: - Declined interpretation services. Patient accompanied by her daughter, [name], who serves as interpreter and part-historian.    Patient was given clear instructions to go to Emergency Department or return to medical center if symptoms don't improve, worsen, or new problems develop.The patient verbalized understanding.  I discussed the assessment and treatment plan with the patient. The patient was provided an opportunity to ask questions and all were answered. The patient agreed with the plan and demonstrated an understanding of the instructions.   The patient was advised to call back or seek an in-person evaluation if the symptoms worsen or if the condition fails to improve as anticipated.    [name], NP 02/20/2021, 9:51 AM Primary Care at [facility]

## 2021-02-20 ENCOUNTER — Ambulatory Visit (INDEPENDENT_AMBULATORY_CARE_PROVIDER_SITE_OTHER): Payer: 59 | Admitting: Family

## 2021-02-20 ENCOUNTER — Encounter: Payer: Self-pay | Admitting: Family

## 2021-02-20 ENCOUNTER — Other Ambulatory Visit: Payer: Self-pay

## 2021-02-20 VITALS — BP 149/68 | HR 68 | Temp 98.5°F | Ht <= 58 in | Wt 130.0 lb

## 2021-02-20 DIAGNOSIS — R7303 Prediabetes: Secondary | ICD-10-CM | POA: Diagnosis not present

## 2021-02-20 DIAGNOSIS — E785 Hyperlipidemia, unspecified: Secondary | ICD-10-CM

## 2021-02-20 DIAGNOSIS — Z7689 Persons encountering health services in other specified circumstances: Secondary | ICD-10-CM

## 2021-02-20 DIAGNOSIS — Z8673 Personal history of transient ischemic attack (TIA), and cerebral infarction without residual deficits: Secondary | ICD-10-CM

## 2021-02-20 DIAGNOSIS — Z758 Other problems related to medical facilities and other health care: Secondary | ICD-10-CM

## 2021-02-20 DIAGNOSIS — I10 Essential (primary) hypertension: Secondary | ICD-10-CM | POA: Diagnosis not present

## 2021-02-20 DIAGNOSIS — Z789 Other specified health status: Secondary | ICD-10-CM

## 2021-02-20 MED ORDER — SIMVASTATIN 40 MG PO TABS: 40.0000 mg | ORAL_TABLET | Freq: Every day | ORAL | 0 refills | Status: DC

## 2021-02-20 NOTE — Patient Instructions (Signed)
Thank you for choosing Primary Care at [facility] for your medical home!    Sandra [name] was seen by Sandra Herter, NP today.   Sandra [name] primary care provider is Sandra Herter, NP.   For the best care possible,  you should try to see Sandra Fruits, NP whenever you come to clinic.   We look forward to seeing you again soon!  If you have any questions about your visit today,  please call us at [phone]  Or feel free to reach your provider via [place].   Keeping you healthy   Get these tests Blood pressure- Have your blood pressure checked once a year by your healthcare provider.  Normal blood pressure is 120/80. Weight- Have your body mass index (BMI) calculated to screen for obesity.  BMI is a measure of body fat based on [name] and weight. You can also calculate your own BMI at [employer]velBags.it. Cholesterol- Have your cholesterol checked regularly starting at age 53, sooner may be necessary if you have diabetes, high blood pressure, if a family member developed heart diseases at an early age or if you smoke.  Chlamydia, HIV, and other sexual transmitted disease- Get screened each year until the age of 28 then within three months of each new sexual partner. Diabetes- Have your blood sugar checked regularly if you have high blood pressure, high cholesterol, a family history of diabetes or if you are overweight.   Get these vaccines Flu shot- Every fall. Tetanus shot- Every 10 years. Menactra- Single dose; prevents meningitis.   Take these steps Don't smoke- If you do smoke, ask your healthcare provider about quitting. For tips on how to quit, go to www.smokefree.gov or call 1-800-QUIT-NOW. Be physically active- Exercise 5 days a week for at least 30 minutes.  If you are not already physically active start slow and gradually work up to 30 minutes of moderate physical activity.  Examples of moderate activity include walking briskly, mowing the yard, dancing,  swimming bicycling, etc. Eat a healthy diet- Eat a variety of healthy foods such as [name], vegetables, low fat milk, low fat cheese, yogurt, lean meats, poultry, fish, beans, tofu, etc.  For more information on healthy eating, go to www.thenutritionsource.org Drink alcohol in moderation- Limit alcohol intake two drinks or less a day.  Never drink and drive. Dentist- Brush and floss teeth twice daily; visit your dentis twice a year. Depression-Your emotional health is as important as your physical health.  If you're feeling down, losing interest in things you normally enjoy please talk with your healthcare provider. Gun Safety- If you keep a gun in your home, keep it unloaded and with the safety lock on.  Bullets should be stored separately. Helmet use- Always wear a helmet when riding a motorcycle, bicycle, rollerblading or skateboarding. Safe sex- If you may be exposed to a sexually transmitted infection, use a condom Seat belts- Seat bels can save your life; always wear one. Smoke/Carbon Monoxide detectors- These detectors need to be installed on the appropriate level of your home.  Replace batteries at least once a year. Skin Cancer- When out in the sun, cover up and use sunscreen SPF 15 or higher. Violence- If anyone is threatening or hurting you, please tell your healthcare provider.

## 2021-02-20 NOTE — Progress Notes (Signed)
Pt presents to establish care accompanied by daughter [name]  Needs refill on Simvastatin pt has not had it due to insurance and pharmacy change

## 2021-03-27 ENCOUNTER — Other Ambulatory Visit (HOSPITAL_COMMUNITY): Payer: Self-pay

## 2021-04-15 ENCOUNTER — Other Ambulatory Visit: Payer: Self-pay

## 2021-04-15 DIAGNOSIS — B20 Human immunodeficiency virus [HIV] disease: Secondary | ICD-10-CM

## 2021-04-17 ENCOUNTER — Other Ambulatory Visit: Payer: Self-pay

## 2021-04-17 ENCOUNTER — Other Ambulatory Visit: Payer: 59

## 2021-04-17 ENCOUNTER — Ambulatory Visit: Payer: Self-pay | Admitting: Pharmacist

## 2021-04-17 DIAGNOSIS — B20 Human immunodeficiency virus [HIV] disease: Secondary | ICD-10-CM

## 2021-04-18 LAB — T-HELPER CELL (CD4) - (RCID CLINIC ONLY)
CD4 % Helper T Cell: 6 % — ABNORMAL LOW (ref 33–65)
CD4 T Cell Abs: 176 /uL — ABNORMAL LOW (ref 400–1790)

## 2021-04-20 LAB — HIV-1 RNA QUANT-NO REFLEX-BLD
HIV 1 RNA Quant: 37 copies/mL — ABNORMAL HIGH
HIV-1 RNA Quant, Log: 1.57 Log copies/mL — ABNORMAL HIGH

## 2021-04-22 ENCOUNTER — Telehealth: Payer: Self-pay

## 2021-04-22 NOTE — Progress Notes (Signed)
Please call Micha's daughter to let her know that her labs look much better. Her viral load is down to only 31 - this is great and in the undetectable range! ?Her immune system is slowly going to show improvements as long as she continues her medication.  ? ?Please have her continue the Bactrim and Biktarvy every day together to keep this "in remission" and allow her body to recover.  See them in June!

## 2021-04-22 NOTE — Telephone Encounter (Signed)
Spoke with patient's daughter, relayed that Zekiah's viral load is considered undetectable. Discussed that Inaya's immune system will continue to improve as long as she continues to take both her Biktarvy and Bactrim together. Patient's daughter verbalized understanding.  ? ?Beryle Flock, RN ? ?

## 2021-04-22 NOTE — Telephone Encounter (Signed)
-----   Message from [place] Callas, NP sent at 04/22/2021  3:54 PM EDT ----- ?Please call Sherol's daughter to let her know that her labs look much better. Her viral load is down to only 31 - this is great and in the undetectable range! ?Her immune system is slowly going to show improvements as long as she continues her medication.  ? ?Please have her continue the Bactrim and Biktarvy every day together to keep this "in remission" and allow her body to recover.  See them in June! ?

## 2021-04-23 ENCOUNTER — Ambulatory Visit (INDEPENDENT_AMBULATORY_CARE_PROVIDER_SITE_OTHER): Payer: 59 | Admitting: Pharmacist

## 2021-04-23 ENCOUNTER — Other Ambulatory Visit: Payer: Self-pay

## 2021-04-23 ENCOUNTER — Telehealth: Payer: Self-pay | Admitting: Student-PharmD

## 2021-04-23 DIAGNOSIS — B20 Human immunodeficiency virus [HIV] disease: Secondary | ICD-10-CM | POA: Diagnosis not present

## 2021-04-23 DIAGNOSIS — I10 Essential (primary) hypertension: Secondary | ICD-10-CM

## 2021-04-23 MED ORDER — AMLODIPINE BESYLATE 10 MG PO TABS: ORAL_TABLET | ORAL | 0 refills | Status: DC

## 2021-04-23 MED ORDER — BIKTARVY 50-200-25 MG PO TABS: 1.0000 | ORAL_TABLET | Freq: Every day | ORAL | 2 refills | Status: DC

## 2021-04-23 MED ORDER — SULFAMETHOXAZOLE-TRIMETHOPRIM 400-80 MG PO TABS: 1.0000 | ORAL_TABLET | Freq: Every day | ORAL | 2 refills | Status: DC

## 2021-04-23 MED ORDER — LISINOPRIL-HYDROCHLOROTHIAZIDE 20-12.5 MG PO TABS: 1.0000 | ORAL_TABLET | Freq: Every day | ORAL | 0 refills | Status: DC

## 2021-04-23 NOTE — Progress Notes (Signed)
? ?04/23/2021 ? ?HPI: Sandra [name] is a 65 y.o. female who presents to the [facility] clinic for HIV/Medication Adherence follow-up. ? ?Patient Active Problem List  ? Diagnosis Date Noted  ? Pedal edema 04/17/2020  ? Chronic pain of left knee 05/16/2019  ? HIV (human immunodeficiency virus infection) ([place]) 01/19/2019  ? AIDS (acquired immune deficiency syndrome) ([place]) 11/30/2017  ? CVA (cerebral vascular accident) ([place]) 11/25/2017  ? Stroke [facility]) 12/08/2014  ? Left-sided weakness   ? HLD (hyperlipidemia)   ? Essential hypertension   ? Cerebrovascular accident (CVA) due to occlusion of cerebral artery ([place])   ? ? ?Patient's Medications  ?New Prescriptions  ? No medications on file  ?Previous Medications  ? AMLODIPINE (NORVASC) 10 MG TABLET    TAKE 1 TABLET(10 MG) BY MOUTH DAILY  ? BICTEGRAVIR-EMTRICITABINE-TENOFOVIR AF (BIKTARVY) 50-200-25 MG TABS TABLET    Take 1 tablet by mouth daily.  ? HYDROCODONE-ACETAMINOPHEN (NORCO/VICODIN) 5-325 MG TABLET    Take 1/2 to 1 tablet every 6 hours as needed for pain.  ? LISINOPRIL-HYDROCHLOROTHIAZIDE (ZESTORETIC) 20-12.5 MG TABLET    TAKE 1 TABLET BY MOUTH DAILY  ? SIMVASTATIN (ZOCOR) 40 MG TABLET    Take 1 tablet (40 mg total) by mouth daily at 6 PM.  ? SULFAMETHOXAZOLE-TRIMETHOPRIM (BACTRIM) 400-80 MG TABLET    Take 1 tablet by mouth daily.  ? VALACYCLOVIR (VALTREX) 1000 MG TABLET    Take 1 tablet (1,000 mg total) by mouth 3 (three) times daily.  ?Modified Medications  ? No medications on file  ?Discontinued Medications  ? No medications on file  ? ? ?Allergies: ?No Known Allergies ? ?Past Medical History: ?Past Medical History:  ?Diagnosis Date  ? Arthritis   ? Headache   ? HIV (human immunodeficiency virus infection) ([place]) 01/19/2019  ? Hypertension   ? Stroke [facility])   ? ? ?Social History: ?Social History  ? ?Socioeconomic History  ? Marital status: Single  ?  Spouse name: Not on file  ? Number of children: Not on file  ? Years of education: Not on file  ? Highest education  level: Not on file  ?Occupational History  ? Not on file  ?Tobacco Use  ? Smoking status: Never  ?  Passive exposure: Never  ? Smokeless tobacco: Never  ?Vaping Use  ? Vaping Use: Never used  ?Substance and Sexual Activity  ? Alcohol use: Never  ? Drug use: Never  ? Sexual activity: Not Currently  ?Other Topics Concern  ? Not on file  ?Social History Narrative  ? ** Merged History Encounter **  ?    ? ?Social Determinants of Health  ? ?Financial Resource Strain: Not on file  ?Food Insecurity: Not on file  ?Transportation Needs: Not on file  ?Physical Activity: Not on file  ?Stress: Not on file  ?Social Connections: Not on file  ? ? ?Labs: ?Lab Results  ?Component Value Date  ? HIV1RNAQUANT 37 (H) 04/17/2021  ? HIV1RNAQUANT 310,000 (H) 02/11/2021  ? HIV1RNAQUANT 21 (H) 10/16/2020  ? CD4TABS 176 (L) 04/17/2021  ? CD4TABS 137 (L) 10/16/2020  ? CD4TABS 165 (L) 07/17/2020  ? ? ?RPR and STI ?Lab Results  ?Component Value Date  ? LABRPR NON-REACTIVE 02/02/2020  ? LABRPR NON-REACTIVE 11/23/2018  ? LABRPR Non Reactive 11/27/2017  ? ? ?STI Results GC CT  ?11/23/2018 ?12:11 PM Negative   Negative    ? ? ?Hepatitis B ?Lab Results  ?Component Value Date  ? HEPBSAG Negative 11/27/2017  ? ?Hepatitis C ?No  results found for: [place], [place] ?Hepatitis A ?Lab Results  ?Component Value Date  ? 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 ?Lipids: ?Lab Results  ?Component Value Date  ? CHOL 214 (H) 02/11/2021  ? TRIG 185 (H) 02/11/2021  ? HDL 38 (L) 02/11/2021  ? CHOLHDL 5.6 (H) 02/11/2021  ? VLDL 15 11/26/2017  ? LDLCALC 144 (H) 02/11/2021  ? ? ?Current HIV Regimen: ?Biktarvy ? ?Assessment: ?Sandra [name] presents today to the clinic for a medication adherence follow up. She is accompanied by her daughter, who served as our interpreter. Patient was last seen by [facility] in 02/2021. CD4 level at that visit was 120 and patient was contacted to start Bactrim for PJP ppx.  ? ?Overall, the patient reports feeling fine. She states she does not miss doses of  her Biktarvy. Her daughter lives with her and helps with her medications. Patient says she takes her medications in the morning at 8am. Patients reports no issues or concerns with her medications. However, patient was unaware about starting Bactrim. Counseled patient about Bactrim and its purpose in preventing infections as her immune system is recovering. Discussed with patient about improvement in her HIV RNA and CD4 cells since last visit. Patient notes about a decrease in appetite for about a month but is not overly concerned about it. Patient denies any rapid weight loss recently. Discussed with patient about how loss of appetite could be from HIV RNA numbers being high and as viral load decreases that appetite should improve.  ? ?Discussed methods on helping with medication access and adherence. Patient says she would like medications shipped to home if possible. Will send prescriptions to [facility] in [place] for mail-order. Told patient that the pharmacy will call to finalize shipment. Patient verbalized understanding. Provided patient with pillbox today.  ? ?Plan: ?- Sent prescriptions for Amlodipine, Lisinopril/HCTZ, Biktarvy, and Bactrim to [facility] in [place] for mail order. ?- Start taking Bactrim and continue all other medications.  ?- Follow Up on 6/6 with [name] ?- Contact with any questions or concerns ? ?Sandra [name], PharmD Candidate ?04/23/2021 1:22 PM ? ?

## 2021-05-08 NOTE — Progress Notes (Signed)
Erroneous encounter

## 2021-05-13 ENCOUNTER — Encounter: Payer: 59 | Admitting: Family

## 2021-05-13 DIAGNOSIS — Z789 Other specified health status: Secondary | ICD-10-CM

## 2021-05-13 DIAGNOSIS — Z0000 Encounter for general adult medical examination without abnormal findings: Secondary | ICD-10-CM

## 2021-05-13 DIAGNOSIS — I10 Essential (primary) hypertension: Secondary | ICD-10-CM

## 2021-05-13 DIAGNOSIS — E785 Hyperlipidemia, unspecified: Secondary | ICD-10-CM

## 2021-05-13 DIAGNOSIS — Z13820 Encounter for screening for osteoporosis: Secondary | ICD-10-CM

## 2021-05-13 DIAGNOSIS — Z113 Encounter for screening for infections with a predominantly sexual mode of transmission: Secondary | ICD-10-CM

## 2021-05-13 DIAGNOSIS — Z124 Encounter for screening for malignant neoplasm of cervix: Secondary | ICD-10-CM

## 2021-05-13 DIAGNOSIS — Z1329 Encounter for screening for other suspected endocrine disorder: Secondary | ICD-10-CM

## 2021-05-13 DIAGNOSIS — Z1231 Encounter for screening mammogram for malignant neoplasm of breast: Secondary | ICD-10-CM

## 2021-05-13 DIAGNOSIS — Z130 Encounter for screening for diseases of the blood and blood-forming organs and certain disorders involving the immune mechanism: Secondary | ICD-10-CM

## 2021-05-28 ENCOUNTER — Other Ambulatory Visit: Payer: Self-pay

## 2021-05-28 ENCOUNTER — Other Ambulatory Visit: Payer: Self-pay | Admitting: Family

## 2021-05-28 DIAGNOSIS — E785 Hyperlipidemia, unspecified: Secondary | ICD-10-CM

## 2021-05-28 DIAGNOSIS — Z8673 Personal history of transient ischemic attack (TIA), and cerebral infarction without residual deficits: Secondary | ICD-10-CM

## 2021-05-28 MED ORDER — SIMVASTATIN 40 MG PO TABS: 40.0000 mg | ORAL_TABLET | Freq: Every day | ORAL | 0 refills | Status: DC

## 2021-05-28 NOTE — Telephone Encounter (Signed)
Medication Refill - Medication: simvastatin (ZOCOR) 40 MG tablet  Has the patient contacted their pharmacy? Yes.   (Agent: If no, request that the patient contact the pharmacy for the refill. If patient does not wish to contact the pharmacy document the reason why and proceed with request.) (Agent: If yes, when and what did the pharmacy advise?)  Preferred Pharmacy (with phone number or street name):  [facility] [address] [place]  [place] [address]  Phone: [phone] Fax: [phone]   Has the patient been seen for an appointment in the last year OR does the patient have an upcoming appointment? Yes.    Agent: Please be advised that RX refills may take up to 3 business days. We ask that you follow-up with your pharmacy.

## 2021-05-29 NOTE — Telephone Encounter (Signed)
Refilled 05/28/21, receipt confirmed by pharmacy. Requested Prescriptions  Pending Prescriptions Disp Refills  . simvastatin (ZOCOR) 40 MG tablet 120 tablet 0    Sig: Take 1 tablet (40 mg total) by mouth daily at 6 PM.     Cardiovascular:  Antilipid - Statins Failed - 05/28/2021  5:38 PM      Failed - Lipid Panel in normal range within the last 12 months    Cholesterol  Date Value Ref Range Status  02/11/2021 214 (H) <200 mg/dL Final   LDL Cholesterol (Calc)  Date Value Ref Range Status  02/11/2021 144 (H) mg/dL (calc) Final    Comment:    Reference range: <100 . Desirable range <100 mg/dL for primary prevention;   <70 mg/dL for patients with CHD or diabetic patients  with > or = 2 CHD risk factors. Marland Kitchen LDL-C is now calculated using the Martin-Hopkins  calculation, which is a validated novel method providing  better accuracy than the Friedewald equation in the  estimation of LDL-C.  Cresenciano Genre et al. Annamaria Helling. 2229;798(92): 2061-2068  (http://education.[employer].com/faq/FAQ164)    HDL  Date Value Ref Range Status  02/11/2021 38 (L) > OR = 50 mg/dL Final   Triglycerides  Date Value Ref Range Status  02/11/2021 185 (H) <150 mg/dL Final         Passed - Patient is not pregnant      Passed - Valid encounter within last 12 months    Recent Outpatient Visits          3 months ago Encounter to establish care   Primary Care at [facility], Flonnie Hailstone, NP      Future Appointments            In 1 month Doren Custard, Melton Krebs, NP [facility] for Infectious Disease, [facility]   In 1 month Camillia Herter, NP Primary Care at [facility]

## 2021-06-11 ENCOUNTER — Ambulatory Visit: Payer: Self-pay | Admitting: Infectious Diseases

## 2021-06-26 NOTE — Progress Notes (Deleted)
Patient ID: Sandra [name], female    DOB: [birth year]  MRN: [id number]  CC: Annual Physical Exam  Subjective: Sandra [name] is a 66 y.o. female who presents for annual physical exam.   Her concerns today include:  HTN - Amlodipine, Lisinopril-HCTZ  HLD /CVA - Simvastatin   Mammo  Osteoporosis screening  PAP referral   Patient Active Problem List   Diagnosis Date Noted   Pedal edema 04/17/2020   Chronic pain of left knee 05/16/2019   HIV (human immunodeficiency virus infection) ([place]) 01/19/2019   AIDS (acquired immune deficiency syndrome) ([place]) 11/30/2017   CVA (cerebral vascular accident) ([place]) 11/25/2017   Stroke ([place]) 12/08/2014   Left-sided weakness    HLD (hyperlipidemia)    Essential hypertension    Cerebrovascular accident (CVA) due to occlusion of cerebral artery ([place])      Current Outpatient Medications on File Prior to Visit  Medication Sig Dispense Refill   amLODipine (NORVASC) 10 MG tablet TAKE 1 TABLET(10 MG) BY MOUTH DAILY 90 tablet 0   bictegravir-emtricitabine-tenofovir AF (BIKTARVY) 50-200-25 MG TABS tablet Take 1 tablet by mouth daily. 30 tablet 2   HYDROcodone-acetaminophen (NORCO/VICODIN) 5-325 MG tablet Take 1/2 to 1 tablet every 6 hours as needed for pain. 20 tablet 0   lisinopril-hydrochlorothiazide (ZESTORETIC) 20-12.5 MG tablet Take 1 tablet by mouth daily. 90 tablet 0   simvastatin (ZOCOR) 40 MG tablet Take 1 tablet (40 mg total) by mouth daily at 6 PM. 120 tablet 0   sulfamethoxazole-trimethoprim (BACTRIM) 400-80 MG tablet Take 1 tablet by mouth daily. 30 tablet 2   valACYclovir (VALTREX) 1000 MG tablet Take 1 tablet (1,000 mg total) by mouth 3 (three) times daily. 21 tablet 0   No current facility-administered medications on file prior to visit.    No Known Allergies  Social History   Socioeconomic History   Marital status: Single    Spouse name: Not on file   Number of children: Not on file   Years of education: Not on file    Highest education level: Not on file  Occupational History   Not on file  Tobacco Use   Smoking status: Never    Passive exposure: Never   Smokeless tobacco: Never  Vaping Use   Vaping Use: Never used  Substance and Sexual Activity   Alcohol use: Never   Drug use: Never   Sexual activity: Not Currently  Other Topics Concern   Not on file  Social History Narrative   ** Merged History Encounter **       Social Determinants of Health   Financial Resource Strain: Low Risk  (11/25/2017)   Overall Financial Resource Strain (CARDIA)    Difficulty of Paying Living Expenses: Not hard at all  Food Insecurity: No Food Insecurity (11/25/2017)   Hunger Vital Sign    Worried About Running Out of Food in the Last Year: Never true    [place] in the Last Year: Never true  Transportation Needs: No Transportation Needs (11/25/2017)   PRAPARE - Hydrologist (Medical): No    Lack of Transportation (Non-Medical): No  Physical Activity: Unknown (11/25/2017)   Exercise Vital Sign    Days of Exercise per Week: Patient refused    Minutes of Exercise per Session: Patient refused  Stress: No Stress Concern Present (11/25/2017)   [place]    Feeling of Stress : Not at all  Social Connections: Unknown (  11/25/2017)   Social Connection and Isolation Panel [NHANES]    Frequency of Communication with Friends and Family: More than three times a week    Frequency of Social Gatherings with Friends and Family: More than three times a week    Attends Religious Services: More than 4 times per year    Active Member of [employer] or Organizations: No    Attends Archivist Meetings: Never    Marital Status: Patient refused  Intimate Partner Violence: Unknown (11/25/2017)   Humiliation, Afraid, Rape, and Kick questionnaire    Fear of Current or Ex-Partner: Patient refused    Emotionally Abused: Patient  refused    Physically Abused: Patient refused    Sexually Abused: Patient refused    Family History  Problem Relation Age of Onset   Colon cancer Neg Hx    Esophageal cancer Neg Hx    Rectal cancer Neg Hx    Stomach cancer Neg Hx     Past Surgical History:  Procedure Laterality Date   EYE SURGERY      ROS: Review of Systems Negative except as stated above  PHYSICAL EXAM: There were no vitals taken for this visit.  Physical Exam  {female adult master:310786} {female adult master:310785}     Latest Ref Rng & Units 02/11/2021   11:49 AM 10/16/2020    9:34 AM 07/17/2020   11:23 AM  CMP  Glucose 65 - 99 mg/dL 95  106  112   BUN 7 - 25 mg/dL '12  10  14   '$ Creatinine 0.50 - 1.05 mg/dL 0.88  0.88  1.03   Sodium 135 - 146 mmol/L 138  137  138   Potassium 3.5 - 5.3 mmol/L 4.0  4.3  3.8   Chloride 98 - 110 mmol/L 103  101  102   CO2 20 - 32 mmol/L '28  31  27   '$ Calcium 8.6 - 10.4 mg/dL 9.2  9.6  9.7   Total Protein 6.1 - 8.1 g/dL 8.2     Total Bilirubin 0.2 - 1.2 mg/dL 0.8     AST 10 - 35 U/L 18     ALT 6 - 29 U/L 13      Lipid Panel     Component Value Date/Time   CHOL 214 (H) 02/11/2021 1149   TRIG 185 (H) 02/11/2021 1149   HDL 38 (L) 02/11/2021 1149   CHOLHDL 5.6 (H) 02/11/2021 1149   VLDL 15 11/26/2017 0354   LDLCALC 144 (H) 02/11/2021 1149    CBC    Component Value Date/Time   WBC 6.3 02/02/2020 1050   RBC 4.35 02/02/2020 1050   HGB 13.3 02/02/2020 1050   HGB 11.4 11/27/2017 0905   HCT 39.2 02/02/2020 1050   PLT 187 02/02/2020 1050   MCV 90.1 02/02/2020 1050   MCH 30.6 02/02/2020 1050   MCHC 33.9 02/02/2020 1050   RDW 11.3 02/02/2020 1050   LYMPHSABS 3,383 02/02/2020 1050   MONOABS 1.3 (H) 11/25/2017 0733   EOSABS 38 02/02/2020 1050   BASOSABS 19 02/02/2020 1050    ASSESSMENT AND PLAN:  There are no diagnoses linked to this encounter.   Patient was given the opportunity to ask questions.  Patient verbalized understanding of the plan and was able  to repeat key elements of the plan. Patient was given clear instructions to go to Emergency Department or return to medical center if symptoms don't improve, worsen, or new problems develop.The patient verbalized understanding.   No orders  of the defined types were placed in this encounter.    Requested Prescriptions    No prescriptions requested or ordered in this encounter    No follow-ups on file.  Camillia Herter, NP

## 2021-07-01 ENCOUNTER — Ambulatory Visit: Payer: Self-pay | Admitting: Infectious Diseases

## 2021-07-02 ENCOUNTER — Encounter: Payer: 59 | Admitting: Family

## 2021-07-02 DIAGNOSIS — Z8673 Personal history of transient ischemic attack (TIA), and cerebral infarction without residual deficits: Secondary | ICD-10-CM

## 2021-07-02 DIAGNOSIS — Z1329 Encounter for screening for other suspected endocrine disorder: Secondary | ICD-10-CM

## 2021-07-02 DIAGNOSIS — I10 Essential (primary) hypertension: Secondary | ICD-10-CM

## 2021-07-02 DIAGNOSIS — Z0000 Encounter for general adult medical examination without abnormal findings: Secondary | ICD-10-CM

## 2021-07-02 DIAGNOSIS — Z130 Encounter for screening for diseases of the blood and blood-forming organs and certain disorders involving the immune mechanism: Secondary | ICD-10-CM

## 2021-07-02 DIAGNOSIS — Z13820 Encounter for screening for osteoporosis: Secondary | ICD-10-CM

## 2021-07-02 DIAGNOSIS — Z113 Encounter for screening for infections with a predominantly sexual mode of transmission: Secondary | ICD-10-CM

## 2021-07-02 DIAGNOSIS — Z124 Encounter for screening for malignant neoplasm of cervix: Secondary | ICD-10-CM

## 2021-07-02 DIAGNOSIS — Z1231 Encounter for screening mammogram for malignant neoplasm of breast: Secondary | ICD-10-CM

## 2021-07-02 DIAGNOSIS — E785 Hyperlipidemia, unspecified: Secondary | ICD-10-CM

## 2021-07-15 NOTE — Progress Notes (Signed)
Patient ID: Sandra [name], female    DOB: 08-31-1955  MRN: [id number]  CC: Annual Physical Exam  Subjective: Sandra [name] is a 66 y.o. female who presents for annual physical exam. She is accompanied by her daughter who serves as interpreter and part-historian.   Her concerns today include:  Taking blood pressure medications as prescribed. Checking blood pressures at home. Forgot home blood pressure log at home. However, states blood pressures have been on the high end. Did not take any blood pressure medication today due to needing refills. Denies red flag symptoms. Needs refills on Simvastatin. She was established with Neurology for history of stroke but lost follow-up during pandemic. No further issues/concerns.   Patient Active Problem List   Diagnosis Date Noted   Pedal edema 04/17/2020   Chronic pain of left knee 05/16/2019   HIV (human immunodeficiency virus infection) ([place]) 01/19/2019   AIDS (acquired immune deficiency syndrome) ([place]) 11/30/2017   CVA (cerebral vascular accident) ([place]) 11/25/2017   Stroke ([place]) 12/08/2014   Left-sided weakness    HLD (hyperlipidemia)    Essential hypertension    Cerebrovascular accident (CVA) due to occlusion of cerebral artery ([place])      Current Outpatient Medications on File Prior to Visit  Medication Sig Dispense Refill   sulfamethoxazole-trimethoprim (BACTRIM) 400-80 MG tablet Take 1 tablet by mouth daily.     bictegravir-emtricitabine-tenofovir AF (BIKTARVY) 50-200-25 MG TABS tablet Take 1 tablet by mouth daily. 30 tablet 2   valACYclovir (VALTREX) 1000 MG tablet Take 1 tablet (1,000 mg total) by mouth 3 (three) times daily. 21 tablet 0   No current facility-administered medications on file prior to visit.    No Known Allergies  Social History   Socioeconomic History   Marital status: Single    Spouse name: Not on file   Number of children: Not on file   Years of education: Not on file   Highest education level: Not on  file  Occupational History   Not on file  Tobacco Use   Smoking status: Never    Passive exposure: Never   Smokeless tobacco: Never  Vaping Use   Vaping Use: Never used  Substance and Sexual Activity   Alcohol use: Never   Drug use: Never   Sexual activity: Not Currently  Other Topics Concern   Not on file  Social History Narrative   ** Merged History Encounter **       Social Determinants of Health   Financial Resource Strain: Low Risk  (11/25/2017)   Overall Financial Resource Strain (CARDIA)    Difficulty of Paying Living Expenses: Not hard at all  Food Insecurity: No Food Insecurity (11/25/2017)   Hunger Vital Sign    Worried About Running Out of Food in the Last Year: Never true    [place] in the Last Year: Never true  Transportation Needs: No Transportation Needs (11/25/2017)   PRAPARE - Hydrologist (Medical): No    Lack of Transportation (Non-Medical): No  Physical Activity: Unknown (11/25/2017)   Exercise Vital Sign    Days of Exercise per Week: Patient refused    Minutes of Exercise per Session: Patient refused  Stress: No Stress Concern Present (11/25/2017)   [place]    Feeling of Stress : Not at all  Social Connections: Unknown (11/25/2017)   Social Connection and Isolation Panel [NHANES]    Frequency of Communication with Friends and Family:  More than three times a week    Frequency of Social Gatherings with Friends and Family: More than three times a week    Attends Religious Services: More than 4 times per year    Active Member of Clubs or Organizations: No    Attends Archivist Meetings: Never    Marital Status: Patient refused  Intimate Partner Violence: Unknown (11/25/2017)   Humiliation, Afraid, Rape, and Kick questionnaire    Fear of Current or Ex-Partner: Patient refused    Emotionally Abused: Patient refused    Physically Abused:  Patient refused    Sexually Abused: Patient refused    Family History  Problem Relation Age of Onset   Colon cancer Neg Hx    Esophageal cancer Neg Hx    Rectal cancer Neg Hx    Stomach cancer Neg Hx     Past Surgical History:  Procedure Laterality Date   EYE SURGERY      ROS: Review of Systems Negative except as stated above  PHYSICAL EXAM: BP (!) 185/77 (BP Location: Right Arm, Patient Position: Sitting, Cuff Size: Normal)   Pulse 67   Temp 98.3 F (36.8 C)   Resp 18   Wt 128 lb (58.1 kg)   SpO2 97%   BMI 27.69 kg/m   Physical Exam HENT:     Head: Normocephalic and atraumatic.     Right Ear: Tympanic membrane, ear canal and external ear normal.     Left Ear: Tympanic membrane, ear canal and external ear normal.     Nose: Nose normal.     Mouth/Throat:     Mouth: Mucous membranes are moist.     Pharynx: Oropharynx is clear.  Eyes:     Extraocular Movements: Extraocular movements intact.     Conjunctiva/sclera: Conjunctivae normal.     Pupils: Pupils are equal, round, and reactive to light.  Cardiovascular:     Rate and Rhythm: Normal rate and regular rhythm.     Pulses: Normal pulses.     Heart sounds: Normal heart sounds.  Pulmonary:     Effort: Pulmonary effort is normal.     Breath sounds: Normal breath sounds.  Chest:     Comments: Patient declined.  Abdominal:     General: Bowel sounds are normal.     Palpations: Abdomen is soft.  Genitourinary:    Comments: Patient declined.  Musculoskeletal:        General: Normal range of motion.     Right shoulder: Normal.     Left shoulder: Normal.     Right upper arm: Normal.     Left upper arm: Normal.     Right elbow: Normal.     Left elbow: Normal.     Right forearm: Normal.     Left forearm: Normal.     Right wrist: Normal.     Left wrist: Normal.     Right hand: Normal.     Left hand: Normal.     Cervical back: Normal, normal range of motion and neck supple.     Thoracic back: Normal.      Lumbar back: Normal.     Right hip: Normal.     Left hip: Normal.     Right upper leg: Normal.     Left upper leg: Normal.     Right knee: Normal.     Left knee: Normal.     Right lower leg: Normal.     Left lower leg: Normal.  Right ankle: Normal.     Left ankle: Normal.     Right foot: Normal.     Left foot: Normal.  Skin:    General: Skin is warm and dry.     Capillary Refill: Capillary refill takes less than 2 seconds.  Neurological:     General: No focal deficit present.     Mental Status: She is alert and oriented to person, place, and time.  Psychiatric:        Mood and Affect: Mood normal.        Behavior: Behavior normal.     ASSESSMENT AND PLAN: 1. Annual physical exam - Counseled on 150 minutes of exercise per week as tolerated, healthy eating (including decreased daily intake of saturated fats, cholesterol, added sugars, sodium), STI prevention, and routine healthcare maintenance.  2. Screening for metabolic disorder - TJQ30+SPQZ to check kidney function, liver function, and electrolyte balance.  - CMP14+EGFR  3. Thyroid disorder screen - TSH to check thyroid function.  - TSH  4. Screening for deficiency anemia - CBC to screen for anemia. - CBC  5. Encounter for screening mammogram for malignant neoplasm of breast - Referral for breast cancer screening by mammogram.  - MM Digital Screening; Future  6. Pap smear for cervical cancer screening 7. Routine screening for STI (sexually transmitted infection) - Per patient request referral to Gynecology for cervical cancer screening by PAP smear/STI screening.  - Ambulatory referral to Gynecology  8. Osteoporosis screening - DG bone density screening for osteoporosis/osteopenia. - DG Bone Density; Future  9. Uncontrolled hypertension - Blood pressure not at goal during today's visit. Patient asymptomatic without chest pressure, chest pain, palpitations, shortness of breath, worst headache of life, and any  additional red flag symptoms. - Hydralazine 25 mg tablet administered today in office.  - Continue Amlodipine as prescribed.  - Increase Lisinopril-Hydrochlorothiazide from 20-12.5 mg table to 20-25 mg tablet.  - Counseled on blood pressure goal of less than 140/90, low-sodium, DASH diet, medication compliance, 150 minutes of moderate intensity exercise per week as tolerated. Discussed medication compliance, adverse effects. - Follow-up with in 7 days with clinical pharmacist for blood pressure check. Write down your blood pressure readings each day and bring those results along with your home blood pressure monitor to your appointment. Medications may be adjusted at that time if needed. - [facility]erral to [facility] for further evaluation and management.  - Follow-up with primary provider as scheduled. - Ambulatory referral to [facility] - [facility] - lisinopril-hydrochlorothiazide (ZESTORETIC) 20-25 MG tablet; Take 1 tablet by mouth daily.  Dispense: 30 tablet; Refill: 2 - amLODipine (NORVASC) 10 MG tablet; TAKE 1 TABLET(10 MG) BY MOUTH DAILY  Dispense: 30 tablet; Refill: 2 - hydrALAZINE (APRESOLINE) tablet 25 mg  10. Hyperlipidemia, unspecified hyperlipidemia type - Continue Simvastatin as prescribed.  - Update lipid panel.  - Referral to [facility] for further evaluation and management. - Lipid panel - Ambulatory referral to [facility] - simvastatin (ZOCOR) 40 MG tablet; Take 1 tablet (40 mg total) by mouth daily at 6 PM.  Dispense: 30 tablet; Refill: 3  11. History of CVA (cerebrovascular accident) - Continue Simvastatin as prescribed.  - Referral to Neurology for further evaluation and management. - simvastatin (ZOCOR) 40 MG tablet; Take 1 tablet (40 mg total) by mouth daily at 6 PM.  Dispense: 30 tablet; Refill: 3 - Ambulatory referral to Neurology  12. Prediabetes - Update diabetes screening. -  Hemoglobin A1c  13. Language barrier - Patient accompanied by her daughter, [name], who serves as interpreter and part-historian.     Patient was given the opportunity to ask questions.  Patient verbalized understanding of the plan and was able to repeat key elements of the plan. Patient was given clear instructions to go to Emergency Department or return to medical center if symptoms don't improve, worsen, or new problems develop.The patient verbalized understanding.   Orders Placed This Encounter  Procedures   MM Digital Screening   DG Bone Density   Lipid panel   TSH   CBC   Hemoglobin A1c   CMP14+EGFR   Ambulatory referral to Gynecology   Ambulatory referral to [facility]   Ambulatory referral to Neurology     Requested Prescriptions   Signed Prescriptions Disp Refills   lisinopril-hydrochlorothiazide (ZESTORETIC) 20-25 MG tablet 30 tablet 2    Sig: Take 1 tablet by mouth daily.   amLODipine (NORVASC) 10 MG tablet 30 tablet 2    Sig: TAKE 1 TABLET(10 MG) BY MOUTH DAILY   simvastatin (ZOCOR) 40 MG tablet 30 tablet 3    Sig: Take 1 tablet (40 mg total) by mouth daily at 6 PM.    Return in about 1 year (around 07/23/2022) for Physical per patient preference, Follow-Up or next available 1 week BP check with Lurena Joiner, RPH-CPP.  Camillia Herter, NP

## 2021-07-22 ENCOUNTER — Ambulatory Visit (INDEPENDENT_AMBULATORY_CARE_PROVIDER_SITE_OTHER): Payer: 59 | Admitting: Family

## 2021-07-22 ENCOUNTER — Encounter: Payer: Self-pay | Admitting: Family

## 2021-07-22 VITALS — BP 185/77 | HR 67 | Temp 98.3°F | Resp 18 | Wt 128.0 lb

## 2021-07-22 DIAGNOSIS — Z113 Encounter for screening for infections with a predominantly sexual mode of transmission: Secondary | ICD-10-CM

## 2021-07-22 DIAGNOSIS — Z8673 Personal history of transient ischemic attack (TIA), and cerebral infarction without residual deficits: Secondary | ICD-10-CM

## 2021-07-22 DIAGNOSIS — Z1329 Encounter for screening for other suspected endocrine disorder: Secondary | ICD-10-CM | POA: Diagnosis not present

## 2021-07-22 DIAGNOSIS — Z0000 Encounter for general adult medical examination without abnormal findings: Secondary | ICD-10-CM

## 2021-07-22 DIAGNOSIS — Z130 Encounter for screening for diseases of the blood and blood-forming organs and certain disorders involving the immune mechanism: Secondary | ICD-10-CM

## 2021-07-22 DIAGNOSIS — Z789 Other specified health status: Secondary | ICD-10-CM

## 2021-07-22 DIAGNOSIS — E785 Hyperlipidemia, unspecified: Secondary | ICD-10-CM

## 2021-07-22 DIAGNOSIS — I10 Essential (primary) hypertension: Secondary | ICD-10-CM

## 2021-07-22 DIAGNOSIS — Z13228 Encounter for screening for other metabolic disorders: Secondary | ICD-10-CM | POA: Diagnosis not present

## 2021-07-22 DIAGNOSIS — Z124 Encounter for screening for malignant neoplasm of cervix: Secondary | ICD-10-CM

## 2021-07-22 DIAGNOSIS — Z1231 Encounter for screening mammogram for malignant neoplasm of breast: Secondary | ICD-10-CM

## 2021-07-22 DIAGNOSIS — R7303 Prediabetes: Secondary | ICD-10-CM

## 2021-07-22 DIAGNOSIS — Z13820 Encounter for screening for osteoporosis: Secondary | ICD-10-CM

## 2021-07-22 MED ORDER — HYDRALAZINE HCL 10 MG PO TABS: 25.0000 mg | ORAL_TABLET | Freq: Once | ORAL | Status: DC

## 2021-07-22 MED ORDER — AMLODIPINE BESYLATE 10 MG PO TABS: ORAL_TABLET | ORAL | 2 refills | Status: DC

## 2021-07-22 MED ORDER — LISINOPRIL-HYDROCHLOROTHIAZIDE 20-25 MG PO TABS: 1.0000 | ORAL_TABLET | Freq: Every day | ORAL | 2 refills | Status: DC

## 2021-07-22 MED ORDER — HYDRALAZINE HCL 10 MG PO TABS
25.0000 mg | ORAL_TABLET | Freq: Once | ORAL | Status: AC
  Administered 2021-07-22: 25 mg via ORAL

## 2021-07-22 MED ORDER — SIMVASTATIN 40 MG PO TABS
40.0000 mg | ORAL_TABLET | Freq: Every day | ORAL | 3 refills | Status: DC
Start: 2021-07-22 — End: 2021-09-02

## 2021-07-22 NOTE — Progress Notes (Signed)
  Pt presents for annual physical needs refill on BP meds accompanied by daughter [name]

## 2021-07-23 ENCOUNTER — Other Ambulatory Visit: Payer: Self-pay | Admitting: Family

## 2021-07-23 ENCOUNTER — Encounter: Payer: Self-pay | Admitting: Family

## 2021-07-23 DIAGNOSIS — R7303 Prediabetes: Secondary | ICD-10-CM

## 2021-07-23 DIAGNOSIS — Z13228 Encounter for screening for other metabolic disorders: Secondary | ICD-10-CM

## 2021-07-23 DIAGNOSIS — E039 Hypothyroidism, unspecified: Secondary | ICD-10-CM

## 2021-07-23 HISTORY — DX: Prediabetes: R73.03

## 2021-07-23 HISTORY — DX: Hypothyroidism, unspecified: E03.9

## 2021-07-23 LAB — CMP14+EGFR
ALT: 26 IU/L (ref 0–32)
AST: 20 IU/L (ref 0–40)
Albumin/Globulin Ratio: 2.3 — ABNORMAL HIGH (ref 1.2–2.2)
Albumin: 4.9 g/dL (ref 3.9–4.9)
Alkaline Phosphatase: 46 IU/L (ref 44–121)
BUN/Creatinine Ratio: 10 — ABNORMAL LOW (ref 12–28)
BUN: 11 mg/dL (ref 8–27)
Bilirubin Total: 0.3 mg/dL (ref 0.0–1.2)
CO2: 25 mmol/L (ref 20–29)
Calcium: 10.4 mg/dL — ABNORMAL HIGH (ref 8.7–10.3)
Chloride: 99 mmol/L (ref 96–106)
Creatinine, Ser: 1.13 mg/dL — ABNORMAL HIGH (ref 0.57–1.00)
Globulin, Total: 2.1 g/dL (ref 1.5–4.5)
Glucose: 97 mg/dL (ref 70–99)
Potassium: 3.8 mmol/L (ref 3.5–5.2)
Sodium: 138 mmol/L (ref 134–144)
Total Protein: 7 g/dL (ref 6.0–8.5)
eGFR: 54 mL/min/{1.73_m2} — ABNORMAL LOW (ref >59)

## 2021-07-23 LAB — HEMOGLOBIN A1C
Est. average glucose Bld gHb Est-mCnc: 123 mg/dL
Hgb A1c MFr Bld: 5.9 % — ABNORMAL HIGH (ref 4.8–5.6)

## 2021-07-23 LAB — LIPID PANEL
Chol/HDL Ratio: 2.4 ratio (ref 0.0–4.4)
Cholesterol, Total: 224 mg/dL — ABNORMAL HIGH (ref 100–199)
HDL: 95 mg/dL (ref >39)
LDL Chol Calc (NIH): 117 mg/dL — ABNORMAL HIGH (ref 0–99)
Triglycerides: 68 mg/dL (ref 0–149)
VLDL Cholesterol Cal: 12 mg/dL (ref 5–40)

## 2021-07-23 LAB — CBC
Hematocrit: 38 % (ref 34.0–46.6)
Hemoglobin: 13.3 g/dL (ref 11.1–15.9)
MCH: 32.1 pg (ref 26.6–33.0)
MCHC: 35 g/dL (ref 31.5–35.7)
MCV: 92 fL (ref 79–97)
Platelets: 221 10*3/uL (ref 150–450)
RBC: 4.14 x10E6/uL (ref 3.77–5.28)
RDW: 13.1 % (ref 11.7–15.4)
WBC: 6 10*3/uL (ref 3.4–10.8)

## 2021-07-23 LAB — TSH: TSH: 7.38 u[IU]/mL — ABNORMAL HIGH (ref 0.450–4.500)

## 2021-07-23 MED ORDER — LEVOTHYROXINE SODIUM 25 MCG PO TABS: 25.0000 ug | ORAL_TABLET | Freq: Every day | ORAL | 2 refills | Status: DC

## 2021-08-23 ENCOUNTER — Ambulatory Visit: Payer: 59 | Admitting: Pharmacist

## 2021-09-02 ENCOUNTER — Other Ambulatory Visit: Payer: Self-pay

## 2021-09-02 ENCOUNTER — Other Ambulatory Visit (HOSPITAL_COMMUNITY): Payer: Self-pay

## 2021-09-02 ENCOUNTER — Ambulatory Visit (INDEPENDENT_AMBULATORY_CARE_PROVIDER_SITE_OTHER): Payer: 59 | Admitting: Family

## 2021-09-02 ENCOUNTER — Ambulatory Visit: Payer: 59

## 2021-09-02 ENCOUNTER — Encounter: Payer: Self-pay | Admitting: Family

## 2021-09-02 ENCOUNTER — Telehealth: Payer: Self-pay | Admitting: *Deleted

## 2021-09-02 VITALS — BP 153/85 | HR 71 | Temp 100.3°F | Wt 135.0 lb

## 2021-09-02 DIAGNOSIS — I10 Essential (primary) hypertension: Secondary | ICD-10-CM | POA: Diagnosis not present

## 2021-09-02 DIAGNOSIS — E039 Hypothyroidism, unspecified: Secondary | ICD-10-CM

## 2021-09-02 DIAGNOSIS — Z8673 Personal history of transient ischemic attack (TIA), and cerebral infarction without residual deficits: Secondary | ICD-10-CM | POA: Diagnosis not present

## 2021-09-02 DIAGNOSIS — Z0000 Encounter for general adult medical examination without abnormal findings: Secondary | ICD-10-CM | POA: Diagnosis not present

## 2021-09-02 DIAGNOSIS — E785 Hyperlipidemia, unspecified: Secondary | ICD-10-CM | POA: Diagnosis not present

## 2021-09-02 DIAGNOSIS — B20 Human immunodeficiency virus [HIV] disease: Secondary | ICD-10-CM | POA: Diagnosis not present

## 2021-09-02 DIAGNOSIS — R69 Illness, unspecified: Secondary | ICD-10-CM | POA: Diagnosis not present

## 2021-09-02 MED ORDER — LEVOTHYROXINE SODIUM 25 MCG PO TABS: 25.0000 ug | ORAL_TABLET | Freq: Every day | ORAL | 3 refills | Status: DC

## 2021-09-02 MED ORDER — BIKTARVY 50-200-25 MG PO TABS: 1.0000 | ORAL_TABLET | Freq: Every day | ORAL | 3 refills | Status: DC

## 2021-09-02 MED ORDER — AMLODIPINE BESYLATE 10 MG PO TABS: ORAL_TABLET | ORAL | 3 refills | Status: DC

## 2021-09-02 MED ORDER — SIMVASTATIN 40 MG PO TABS
40.0000 mg | ORAL_TABLET | Freq: Every day | ORAL | 3 refills | Status: DC
Start: 2021-09-02 — End: 2021-12-06

## 2021-09-02 MED ORDER — SULFAMETHOXAZOLE-TRIMETHOPRIM 400-80 MG PO TABS
1.0000 | ORAL_TABLET | Freq: Every day | ORAL | 3 refills | Status: DC
Start: 2021-09-02 — End: 2021-10-03

## 2021-09-02 MED ORDER — LISINOPRIL-HYDROCHLOROTHIAZIDE 20-25 MG PO TABS: 1.0000 | ORAL_TABLET | Freq: Every day | ORAL | 3 refills | Status: DC

## 2021-09-02 MED ORDER — LEVOTHYROXINE SODIUM 50 MCG PO TABS: 50.0000 ug | ORAL_TABLET | Freq: Every day | ORAL | 3 refills | Status: DC

## 2021-09-02 NOTE — Assessment & Plan Note (Signed)
   Discussed importance of safe sexual practice and condom use. Condoms and STD testing offered.   Due for Shingrix, high dose influenza (once available), and Tetanus   Colon cancer screening up to date.   Due for routine dental care which she will schedule independently and can refer to [facility] if needed.

## 2021-09-02 NOTE — Assessment & Plan Note (Addendum)
Sandra [name] has poorly controlled virus secondary to being off medication for 2 months related to insurance coverage issues. This should be resolved as she has insurance coverage now. Reviewed previous lab work and discussed plan of care. Reminded to contact clinic if having problems getting medications and avoid waiting. Check lab work today including [place]. Restart Biktarvy and Bactrim as she remains at high risk for opportunistic infection. Plan for follow up in 1 month or sooner if needed with lab work on the same day.

## 2021-09-02 NOTE — Assessment & Plan Note (Signed)
Most recent TSH of 7.3 and was started on 25 mcg levothyroxine. This is likely in part contributing to her fatigue. Will continue current dose of levothyroxine with follow up and changes per Internal Medicine.

## 2021-09-02 NOTE — Assessment & Plan Note (Signed)
Blood pressure mildly elevated today and has been off medication secondary to insurance. No current neurological or ophthalmological signs/symptoms. Restart lisinopril-HCTZ and amlodipine with changes per Internal Medicine.

## 2021-09-02 NOTE — Telephone Encounter (Signed)
Please reschedule appt

## 2021-09-02 NOTE — Patient Instructions (Addendum)
Nice to see you.  We will check your lab work today.  Take your medication daily as prescribed.  Refills have been sent to the pharmacy.  Plan for follow up with [name], NP in 1 months or sooner if needed with lab work on the same day.  Have a great day and stay safe!

## 2021-09-02 NOTE — Progress Notes (Signed)
Brief Narrative   Patient ID: Sandra [name], female    DOB: Jan 05, 1956, 66 y.o.   MRN: [id number]  Ms. 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 Initial CD4 count of 60 with viral load 380,000. Genosure with no significant medication resistant mutation and several novel mutations. [id number] negative. No history of opportunistic infection. Entered care at [facility] Stage 3. Treatment experience with Biktarvy.   Subjective:    Chief Complaint  Patient presents with   Follow-up    [facility]     HPI:  Sandra [name] is a 66 y.o. female with AIDS/HIV disease last seen by Alfonse Spruce, PharmD, CPP on 04/23/21  with improved adherence and good tolerance to [facility] and was unaware that she was supposed to be taking Bactrim secondary to being at high risk of opportunistic infection with CD4 count 120 in February 2023. Updated viral load was undetectable at 37 with CD4 count 176. Medications were sent to mail order to help with access and adherence. Missed follow up appointment on 06/11/21. Ms. Tomasso primary preferred language is Twi and a medical interpreter was declined by daughter with patient agreement. Here today for follow up of HIV and hypertension.  Ms. Aughenbaugh has been off all of her medication for the last 2 months secondary to issues with insurance coverage with new coverage beginning at the beginning of August. Feeling okay today but has been feeling tired at weak at times. Denies fevers, chills, night sweats, headaches, changes in vision, neck pain/stiffness, nausea, diarrhea, vomiting, lesions or rashes. AC is not currently working in her home and is getting it fixed today. Condoms offered. Healthcare maintenance due includes influenza (when available) and Shingrix vaccination.   No Known Allergies    Outpatient Medications Prior to  Visit  Medication Sig Dispense Refill   bictegravir-emtricitabine-tenofovir AF (BIKTARVY) 50-200-25 MG TABS tablet Take 1 tablet by mouth daily. 30 tablet 2   valACYclovir (VALTREX) 1000 MG tablet Take 1 tablet (1,000 mg total) by mouth 3 (three) times daily. 21 tablet 0   amLODipine (NORVASC) 10 MG tablet TAKE 1 TABLET(10 MG) BY MOUTH DAILY 30 tablet 2   levothyroxine (SYNTHROID) 25 MCG tablet Take 1 tablet (25 mcg total) by mouth daily. 30 tablet 2   lisinopril-hydrochlorothiazide (ZESTORETIC) 20-25 MG tablet Take 1 tablet by mouth daily. 30 tablet 2   simvastatin (ZOCOR) 40 MG tablet Take 1 tablet (40 mg total) by mouth daily at 6 PM. 30 tablet 3   sulfamethoxazole-trimethoprim (BACTRIM) 400-80 MG tablet Take 1 tablet by mouth daily.     No facility-administered medications prior to visit.     Past Medical History:  Diagnosis Date   Arthritis    Headache    HIV (human immunodeficiency virus infection) ([place]) 01/19/2019   Hypertension    Hypothyroidism 07/23/2021   Stroke [facility])      Past Surgical History:  Procedure Laterality Date   EYE SURGERY        Review of Systems  Constitutional:  Negative for appetite change, chills, diaphoresis, fatigue, fever and unexpected weight change.  Eyes:        Negative for acute change in vision  Respiratory:  Negative for chest tightness, shortness of breath and wheezing.   Cardiovascular:  Negative for chest pain.  Gastrointestinal:  Negative for diarrhea, nausea and vomiting.  Genitourinary:  Negative for dysuria, pelvic pain and  vaginal discharge.  Musculoskeletal:  Negative for neck pain and neck stiffness.  Skin:  Negative for rash.  Neurological:  Negative for seizures, syncope, weakness and headaches.  Hematological:  Negative for adenopathy. Does not bruise/bleed easily.  Psychiatric/Behavioral:  Negative for hallucinations.       Objective:    BP (!) 153/85   Pulse 71   Temp 100.3 F (37.9 C) (Oral)   Wt 135 lb (61.2  kg)   SpO2 96%   BMI 29.21 kg/m  Nursing note and vital signs reviewed.  Physical Exam Constitutional:      General: She is not in acute distress.    Appearance: She is well-developed.  Eyes:     Conjunctiva/sclera: Conjunctivae normal.  Cardiovascular:     Rate and Rhythm: Normal rate and regular rhythm.     Heart sounds: Normal heart sounds. No murmur heard.    No friction rub. No gallop.  Pulmonary:     Effort: Pulmonary effort is normal. No respiratory distress.     Breath sounds: Normal breath sounds. No wheezing or rales.  Chest:     Chest wall: No tenderness.  Abdominal:     General: Bowel sounds are normal.     Palpations: Abdomen is soft.     Tenderness: There is no abdominal tenderness.  Musculoskeletal:     Cervical back: Neck supple.  Lymphadenopathy:     Cervical: No cervical adenopathy.  Skin:    General: Skin is warm and dry.     Findings: No rash.  Neurological:     Mental Status: She is alert and oriented to person, place, and time.  Psychiatric:        Behavior: Behavior normal.        Thought Content: Thought content normal.        Judgment: Judgment normal.         07/22/2021    2:11 PM 02/20/2021    8:58 AM 02/11/2021   11:15 AM 11/01/2020    9:39 AM 09/27/2019   10:46 AM  Depression screen PHQ 2/9  Decreased Interest 0 0 0 0 0  Down, Depressed, Hopeless 0 0 0 0 0  PHQ - 2 Score 0 0 0 0 0       Assessment & Plan:    Patient Active Problem List   Diagnosis Date Noted   Healthcare maintenance 09/02/2021   Hypothyroidism 07/23/2021   Prediabetes 07/23/2021   Pedal edema 04/17/2020   Chronic pain of left knee 05/16/2019   HIV (human immunodeficiency virus infection) ([place]) 01/19/2019   AIDS (acquired immune deficiency syndrome) ([place]) 11/30/2017   CVA (cerebral vascular accident) ([place]) 11/25/2017   Stroke ([place]) 12/08/2014   Left-sided weakness    HLD (hyperlipidemia)    Essential hypertension    Cerebrovascular accident (CVA) due to  occlusion of cerebral artery ([place])      Problem List Items Addressed This Visit       Cardiovascular and Mediastinum   Essential hypertension    Blood pressure mildly elevated today and has been off medication secondary to insurance. No current neurological or ophthalmological signs/symptoms. Restart lisinopril-HCTZ and amlodipine with changes per Internal Medicine.       Relevant Medications   amLODipine (NORVASC) 10 MG tablet   lisinopril-hydrochlorothiazide (ZESTORETIC) 20-25 MG tablet   simvastatin (ZOCOR) 40 MG tablet     Endocrine   Hypothyroidism    Most recent TSH of 7.3 and was started on 25 mcg levothyroxine. This is likely in  part contributing to her fatigue. Will continue current dose of levothyroxine with follow up and changes per Internal Medicine.       Relevant Medications   levothyroxine (SYNTHROID) 25 MCG tablet     Other   HLD (hyperlipidemia)   Relevant Medications   amLODipine (NORVASC) 10 MG tablet   lisinopril-hydrochlorothiazide (ZESTORETIC) 20-25 MG tablet   simvastatin (ZOCOR) 40 MG tablet   AIDS (acquired immune deficiency syndrome) ([place]) - Primary    Ms. Witucki has poorly controlled virus secondary to being off medication for 2 months related to insurance coverage issues. This should be resolved as she has insurance coverage now. Reviewed previous lab work and discussed plan of care. Reminded to contact clinic if having problems getting medications and avoid waiting. Check lab work today including [place]. Restart Biktarvy and Bactrim as she remains at high risk for opportunistic infection. Plan for follow up in 1 month or sooner if needed with lab work on the same day.      Relevant Medications   bictegravir-emtricitabine-tenofovir AF (BIKTARVY) 50-200-25 MG TABS tablet   sulfamethoxazole-trimethoprim (BACTRIM) 400-80 MG tablet   Other Relevant Orders   HIV RNA, RTPCR W/R GT (RTI, PI,INT)   T-helper cell (CD4)- ([facility] clinic only)   Healthcare  maintenance    Discussed importance of safe sexual practice and condom use. Condoms and STD testing offered.  Due for Shingrix, high dose influenza (once available), and Tetanus  Colon cancer screening up to date.  Due for routine dental care which she will schedule independently and can refer to [facility] if needed.       Other Visit Diagnoses     History of CVA (cerebrovascular accident)       Relevant Medications   simvastatin (ZOCOR) 40 MG tablet        I have discontinued Neera Totzke's levothyroxine. I have also changed her levothyroxine. Additionally, I am having her maintain her valACYclovir, amLODipine, Biktarvy, lisinopril-hydrochlorothiazide, simvastatin, and sulfamethoxazole-trimethoprim.   Meds ordered this encounter  Medications   amLODipine (NORVASC) 10 MG tablet    Sig: TAKE 1 TABLET(10 MG) BY MOUTH DAILY    Dispense:  30 tablet    Refill:  3    Order Specific Question:   Supervising Provider    Answer:   Baxter Flattery, CYNTHIA [[id number]]   bictegravir-emtricitabine-tenofovir AF (BIKTARVY) 50-200-25 MG TABS tablet    Sig: Take 1 tablet by mouth daily.    Dispense:  30 tablet    Refill:  3    Order Specific Question:   Supervising Provider    Answer:   Carlyle Basques [[id number]]   DISCONTD: levothyroxine (SYNTHROID) 50 MCG tablet    Sig: Take 1 tablet (50 mcg total) by mouth daily.    Dispense:  30 tablet    Refill:  3    Order Specific Question:   Supervising Provider    Answer:   Carlyle Basques [[id number]]   lisinopril-hydrochlorothiazide (ZESTORETIC) 20-25 MG tablet    Sig: Take 1 tablet by mouth daily.    Dispense:  30 tablet    Refill:  3    Order Specific Question:   Supervising Provider    Answer:   Baxter Flattery, CYNTHIA [[id number]]   simvastatin (ZOCOR) 40 MG tablet    Sig: Take 1 tablet (40 mg total) by mouth daily at 6 PM.    Dispense:  30 tablet    Refill:  3    Order Specific Question:   Supervising Provider    Answer:  SNIDER, CYNTHIA [[id number]]    sulfamethoxazole-trimethoprim (BACTRIM) 400-80 MG tablet    Sig: Take 1 tablet by mouth daily.    Dispense:  30 tablet    Refill:  3    Order Specific Question:   Supervising Provider    Answer:   Carlyle Basques [[id number]]   levothyroxine (SYNTHROID) 25 MCG tablet    Sig: Take 1 tablet (25 mcg total) by mouth daily.    Dispense:  30 tablet    Refill:  3    Please discontinue 50 mcg dose sent previously    Order Specific Question:   Supervising Provider    Answer:   Carlyle Basques [[id number]]     Follow-up: Return in about 1 month (around 10/03/2021).   Terri Piedra, MSN, FNP-C Nurse Practitioner [facility] [place] number: [phone]

## 2021-09-03 LAB — T-HELPER CELL (CD4) - (RCID CLINIC ONLY)
CD4 % Helper T Cell: 4 % — ABNORMAL LOW (ref 33–65)
CD4 T Cell Abs: 110 /uL — ABNORMAL LOW (ref 400–1790)

## 2021-09-04 ENCOUNTER — Other Ambulatory Visit (HOSPITAL_COMMUNITY)
Admission: RE | Admit: 2021-09-04 | Discharge: 2021-09-04 | Disposition: A | Discharge status: Home / Self Care | Payer: 59 | Source: Ambulatory Visit | Attending: Family Medicine | Admitting: Family Medicine

## 2021-09-04 ENCOUNTER — Ambulatory Visit (INDEPENDENT_AMBULATORY_CARE_PROVIDER_SITE_OTHER): Payer: 59 | Admitting: Family Medicine

## 2021-09-04 ENCOUNTER — Encounter: Payer: Self-pay | Admitting: Family Medicine

## 2021-09-04 VITALS — BP 145/78 | HR 69 | Wt 134.0 lb

## 2021-09-04 DIAGNOSIS — Z124 Encounter for screening for malignant neoplasm of cervix: Secondary | ICD-10-CM | POA: Diagnosis not present

## 2021-09-04 DIAGNOSIS — Z01419 Encounter for gynecological examination (general) (routine) without abnormal findings: Secondary | ICD-10-CM | POA: Diagnosis not present

## 2021-09-04 NOTE — Progress Notes (Signed)
Annual Exam  Patient here with her daughter. Patient would like to establish care.   Abdominal HYST 11/25/2017 on chart per pt and daughter has not had HYST Mammogram:12/15/2017 WNL  Family Hx of Breast Cancer: None  Last pap: 11/13/15 wants pap today.   CC: None

## 2021-09-04 NOTE — Progress Notes (Signed)
Subjective:     Sandra [name] is a 66 y.o. female and is here for a comprehensive physical exam. The patient reports no problems.   The following portions of the patient's history were reviewed and updated as appropriate: allergies, current medications, past family history, past medical history, past social history, past surgical history, and problem list.  Review of Systems Pertinent items noted in HPI and remainder of comprehensive ROS otherwise negative.   Objective:    BP (!) 145/78   Pulse 69   Wt 134 lb (60.8 kg)   BMI 28.99 kg/m  General appearance: alert, cooperative, and appears stated age Head: Normocephalic, without obvious abnormality, atraumatic Neck: no adenopathy, supple, symmetrical, trachea midline, and thyroid not enlarged, symmetric, no tenderness/mass/nodules Lungs: clear to auscultation bilaterally Breasts: normal appearance, no masses or tenderness Heart: regular rate and rhythm, S1, S2 normal, no murmur, click, rub or gallop Abdomen: soft, non-tender; bowel sounds normal; no masses,  no organomegaly Pelvic: cervix normal in appearance, external genitalia normal, no adnexal masses or tenderness, no cervical motion tenderness, uterus normal size, shape, and consistency, and vaginal atrophy Extremities: extremities normal, atraumatic, no cyanosis or edema Pulses: 2+ and symmetric Skin: Skin color, texture, turgor normal. No rashes or lesions Lymph nodes: Cervical, supraclavicular, and axillary nodes normal. Neurologic: Grossly normal    Assessment:    GYN female exam.      Plan:   Screening for malignant neoplasm of cervix - Wants an STD screen iwth this - Plan: Cytology - PAP  Encounter for gynecological examination without abnormal finding - will scheduled mammogram  Return in 1 year (on 09/05/2022) for schedule her mammogram.    See After Visit Summary for Counseling Recommendations

## 2021-09-05 ENCOUNTER — Other Ambulatory Visit: Payer: Self-pay | Admitting: Pharmacist

## 2021-09-05 DIAGNOSIS — Z21 Asymptomatic human immunodeficiency virus [HIV] infection status: Secondary | ICD-10-CM

## 2021-09-05 MED ORDER — BIKTARVY 50-200-25 MG PO TABS: 1.0000 | ORAL_TABLET | Freq: Every day | ORAL | 0 refills | Status: AC

## 2021-09-05 NOTE — Progress Notes (Signed)
Medication Samples have been provided to the patient.  Drug name: Biktarvy        Strength: 50/200/25 mg       Qty: 14 tablets (2 bottles)   LOT: [place]   Exp.Date: 09/2023  Dosing instructions: Take one tablet by mouth once daily  The patient has been instructed regarding the correct time, dose, and frequency of taking this medication, including desired effects and most common side effects.   Dezarai Prew L. Eber Hong, PharmD, BCIDP, AAHIVP, CPP Clinical Pharmacist Practitioner Infectious Diseases [place] [facility] 12/19/2019, 10:07 AM

## 2021-09-10 ENCOUNTER — Other Ambulatory Visit: Payer: 59

## 2021-09-12 ENCOUNTER — Ambulatory Visit
Admission: RE | Admit: 2021-09-12 | Discharge: 2021-09-12 | Disposition: A | Discharge status: Home / Self Care | Payer: 59 | Source: Ambulatory Visit | Attending: Family | Admitting: Family

## 2021-09-12 DIAGNOSIS — Z13820 Encounter for screening for osteoporosis: Secondary | ICD-10-CM

## 2021-09-12 DIAGNOSIS — M810 Age-related osteoporosis without current pathological fracture: Secondary | ICD-10-CM | POA: Diagnosis not present

## 2021-09-12 DIAGNOSIS — Z780 Asymptomatic menopausal state: Secondary | ICD-10-CM | POA: Diagnosis not present

## 2021-09-12 DIAGNOSIS — M85852 Other specified disorders of bone density and structure, left thigh: Secondary | ICD-10-CM | POA: Diagnosis not present

## 2021-09-12 LAB — CYTOLOGY - PAP
Chlamydia: NEGATIVE
Comment: NEGATIVE
Comment: NEGATIVE
Comment: NEGATIVE
Comment: NEGATIVE
Comment: NORMAL
HPV 16: NEGATIVE
HPV 18 / 45: NEGATIVE
High risk HPV: POSITIVE — AB
Neisseria Gonorrhea: NEGATIVE

## 2021-09-18 LAB — HIV-1 INTEGRASE GENOTYPE

## 2021-09-18 LAB — HIV RNA, RTPCR W/R GT (RTI, PI,INT)
HIV 1 RNA Quant: 137000 copies/mL — ABNORMAL HIGH
HIV-1 RNA Quant, Log: 5.14 Log copies/mL — ABNORMAL HIGH

## 2021-09-18 LAB — HIV-1 GENOTYPE: HIV-1 Genotype: DETECTED — AB

## 2021-09-19 ENCOUNTER — Telehealth: Payer: Self-pay

## 2021-09-19 NOTE — Telephone Encounter (Signed)
TC to make aware of abnormal pap An need for Colpo Pt daughter voiced understanding  Call transferred to make [place] appt.

## 2021-10-02 ENCOUNTER — Other Ambulatory Visit: Payer: Self-pay | Admitting: Family

## 2021-10-02 ENCOUNTER — Encounter: Payer: Self-pay | Admitting: Cardiovascular Disease

## 2021-10-02 DIAGNOSIS — M810 Age-related osteoporosis without current pathological fracture: Secondary | ICD-10-CM

## 2021-10-03 ENCOUNTER — Other Ambulatory Visit: Payer: Self-pay

## 2021-10-03 ENCOUNTER — Encounter: Payer: Self-pay | Admitting: Infectious Diseases

## 2021-10-03 ENCOUNTER — Ambulatory Visit (HOSPITAL_BASED_OUTPATIENT_CLINIC_OR_DEPARTMENT_OTHER): Payer: 59 | Admitting: Family

## 2021-10-03 ENCOUNTER — Ambulatory Visit (INDEPENDENT_AMBULATORY_CARE_PROVIDER_SITE_OTHER): Payer: 59 | Admitting: Infectious Diseases

## 2021-10-03 ENCOUNTER — Other Ambulatory Visit (HOSPITAL_COMMUNITY): Payer: Self-pay

## 2021-10-03 ENCOUNTER — Telehealth: Payer: Self-pay

## 2021-10-03 VITALS — BP 142/82 | HR 64 | Temp 98.2°F | Wt 132.0 lb

## 2021-10-03 DIAGNOSIS — B20 Human immunodeficiency virus [HIV] disease: Secondary | ICD-10-CM

## 2021-10-03 DIAGNOSIS — Z23 Encounter for immunization: Secondary | ICD-10-CM | POA: Diagnosis not present

## 2021-10-03 DIAGNOSIS — E785 Hyperlipidemia, unspecified: Secondary | ICD-10-CM

## 2021-10-03 DIAGNOSIS — Z0000 Encounter for general adult medical examination without abnormal findings: Secondary | ICD-10-CM

## 2021-10-03 DIAGNOSIS — E039 Hypothyroidism, unspecified: Secondary | ICD-10-CM | POA: Diagnosis not present

## 2021-10-03 DIAGNOSIS — R69 Illness, unspecified: Secondary | ICD-10-CM | POA: Diagnosis not present

## 2021-10-03 MED ORDER — BIKTARVY 50-200-25 MG PO TABS: 1.0000 | ORAL_TABLET | Freq: Every day | ORAL | 11 refills | Status: DC

## 2021-10-03 MED ORDER — SULFAMETHOXAZOLE-TRIMETHOPRIM 400-80 MG PO TABS: 1.0000 | ORAL_TABLET | Freq: Every day | ORAL | 5 refills | Status: DC

## 2021-10-03 NOTE — Progress Notes (Signed)
Name: Sandra [name]  DOB: [birth year] MRN: [id number] PCP: Sandra Herter, NP    Subjective:   CC: Routine HIV follow up care.      HPI: Sandra [name] is a 66 y.o. female with HIV, stage 3 at diagnosis (11-2017 during hospitalization for acute stroke) with CD4 nadir 50 on once daily Biktarvy for management.   Her daughter is here today as a Twi interpretor was not available for the visit.   Sandra [name] has all of her pill bottles with her today. There are 2 doses of synthroid (50 mcg and 25 mcg) - she has been taking both of these doses. Her energy is actually a lot better and thinks it may be due to this increase. She denies any palpitations, anxiety, weight loss or interrupted sleep. Continues to take her Amlodipine and Lisinopril/hctz as well as statin.   LOV with Sandra [name] showed her VL was up to 130,000 again and CD4 down to 110. Reports no complaints today suggestive of associated opportunistic infection or advancing HIV disease such as fevers, night sweats, weight loss, anorexia, cough, SOB, nausea, vomiting, diarrhea, headache, sensory changes, lymphadenopathy or oral thrush.  Her daughter [facility] [place] for her to pick these up locally. Otherwise has had trouble picking up refills timely.   Needs flu vaccine today. Pap smear results abnormal with need for colposcopy - pending scheduling now      Review of Systems  Constitutional:  Negative for chills, fever, malaise/fatigue and weight loss.  HENT:  Negative for sore throat.   Respiratory:  Negative for cough, sputum production and shortness of breath.   Cardiovascular: Negative.   Gastrointestinal:  Negative for abdominal pain, diarrhea and vomiting.  Musculoskeletal:  Negative for joint pain, myalgias and neck pain.  Skin:  Negative for rash.  Neurological:  Negative for tingling, focal weakness and headaches.  Psychiatric/Behavioral:  Negative for depression and  substance abuse. The patient is not nervous/anxious.      Past Medical History:  Diagnosis Date   Arthritis    Headache    [place]) [place] 01/19/2019   Hypertension    Hypothyroidism 07/23/2021   Stroke ([facility]     Outpatient Medications Prior to Visit  Medication Sig Dispense Refill   amLODipine (NORVASC) 10 MG tablet TAKE 1 TABLET(10 MG) BY MOUTH DAILY 30 tablet 3   levothyroxine (SYNTHROID) 25 MCG tablet Take 1 tablet (25 mcg total) by mouth daily. 30 tablet 3   lisinopril-hydrochlorothiazide (ZESTORETIC) 20-25 MG tablet Take 1 tablet by mouth daily. 30 tablet 3   simvastatin (ZOCOR) 40 MG tablet Take 1 tablet (40 mg total) by mouth daily at 6 PM. 30 tablet 3   valACYclovir (VALTREX) 1000 MG tablet Take 1 tablet (1,000 mg total) by mouth 3 (three) times daily. 21 tablet 0   bictegravir-emtricitabine-tenofovir AF (BIKTARVY) 50-200-25 MG TABS tablet Take 1 tablet by mouth daily. 30 tablet 3   sulfamethoxazole-trimethoprim (BACTRIM) 400-80 MG tablet Take 1 tablet by mouth daily. 30 tablet 3   No facility-administered medications prior to visit.     No Known Allergies   Social History   Tobacco Use   Smoking status: Never    Passive exposure: Never   Smokeless tobacco: Never  Vaping Use   Vaping Use: Never used  Substance Use Topics   Alcohol use: Never   Drug use: Never     Social History  Substance and Sexual Activity  Sexual Activity Not Currently     Objective:   Vitals:   10/03/21 0915  BP: (!) 142/82  Pulse: 64  Temp: 98.2 F (36.8 C)  TempSrc: Oral  SpO2: 98%  Weight: 132 lb (59.9 kg)    Body mass index is 28.56 kg/m.   Physical Exam HENT:     Mouth/Throat:     Mouth: No oral lesions.     Dentition: No dental abscesses.  Cardiovascular:     Rate and Rhythm: Normal rate and regular rhythm.     Heart sounds: Normal heart sounds.  Pulmonary:     Effort: Pulmonary effort is normal.     Breath sounds:  Normal breath sounds.  Abdominal:     General: There is no distension.     Palpations: Abdomen is soft.     Tenderness: There is no abdominal tenderness.  Musculoskeletal:        General: No tenderness. Normal range of motion.  Lymphadenopathy:     Cervical: No cervical adenopathy.  Skin:    General: Skin is warm and dry.     Findings: No rash.  Neurological:     Mental Status: She is alert and oriented to person, place, and time.  Psychiatric:        Judgment: Judgment normal.     Lab Results Lab Results  Component Value Date   WBC 6.0 07/22/2021   HGB 13.3 07/22/2021   HCT 38.0 07/22/2021   MCV 92 07/22/2021   PLT 221 07/22/2021    Lab Results  Component Value Date   CREATININE 1.13 (H) 07/22/2021   BUN 11 07/22/2021   NA 138 07/22/2021   K 3.8 07/22/2021   CL 99 07/22/2021   CO2 25 07/22/2021    Lab Results  Component Value Date   ALT 26 07/22/2021   AST 20 07/22/2021   ALKPHOS 46 07/22/2021   BILITOT 0.3 07/22/2021    Lab Results  Component Value Date   CHOL 224 (H) 07/22/2021   HDL 95 07/22/2021   LDLCALC 117 (H) 07/22/2021   TRIG 68 07/22/2021   CHOLHDL 2.4 07/22/2021   HIV 1 RNA Quant  Date Value  09/02/2021 137,000 copies/mL (H)  04/17/2021 37 copies/mL (H)  02/11/2021 310,000 Copies/mL (H)   CD4 T Cell Abs (/uL)  Date Value  09/02/2021 110 (L)  04/17/2021 176 (L)  10/16/2020 137 (L)     Assessment & Plan:   Patient Active Problem List   Diagnosis Date Noted   HIV (human immunodeficiency virus infection) ([place]) 01/19/2019   AIDS (acquired immune deficiency syndrome) ([place]) 11/30/2017   Healthcare maintenance 09/02/2021   Hypothyroidism 07/23/2021   Prediabetes 07/23/2021   Pedal edema 04/17/2020   Chronic pain of left knee 05/16/2019   CVA (cerebral vascular accident) ([place]) 11/25/2017   Stroke ([place]) 12/08/2014   Left-sided weakness    HLD (hyperlipidemia)    Essential hypertension    Cerebrovascular accident (CVA) due to  occlusion of cerebral artery ([place])     Problem List Items Addressed This Visit       High   HIV (human immunodeficiency virus infection) ([place]) (Chronic)    Uncontrolled with VL 137,000 copies.  Continue biktarvy in setting of poor adherence d/t insurance and pharmacy issues. Fortunately, she is tolerating the medication well without side effects. No drug interactions identified. Pertinent lab tests ordered today.  Reminded about ICAP re-enrollment in July / January.   No dental needs today.  No concern over anxious/depressed mood.  Sexual health and family planning discussed - no needs - she is s/p menopause Pap smear abnormal with LSIL - colpo pending.  Vaccines updated today - see health maintenance section.    Return in about 3 months (around 01/02/2022).        Relevant Medications   bictegravir-emtricitabine-tenofovir AF (BIKTARVY) 50-200-25 MG TABS tablet   sulfamethoxazole-trimethoprim (BACTRIM) 400-80 MG tablet   AIDS (acquired immune deficiency syndrome) ([place])    Reviewed previous genosure - no mutations detected that would warrant med change. She has historically had trouble with getting refills from pharmacy timely with her daughters busy schedule. This was at one point corrected with mail order pharmacy however now she has different insurance. Will change bactrim and biktarvy to local pharmacy at their request to facilitate ease of refills.  Continue Bactrim daily for ppx. No concern for OI today related to advanced HIV/AIDS.       Relevant Medications   bictegravir-emtricitabine-tenofovir AF (BIKTARVY) 50-200-25 MG TABS tablet   sulfamethoxazole-trimethoprim (BACTRIM) 400-80 MG tablet   Other Relevant Orders   HIV 1 RNA quant-no reflex-bld   Flu Vaccine QUAD High Dose(Fluad) (Completed)     Unprioritized   HLD (hyperlipidemia)    Continue high intensity statin given h/o HIV and previous stroke for secondary prevention.       Hypothyroidism - Primary    Not  clear how she has a 50 mcg and 25 mcg rxs but she has felt better on this higher dose of synthroid. Will repeat her TSH today to see what her levels show. Recommend she follow up with PCP in 37mto recheck to ensure she is still euthyroid on 722mgc / d dose.       Relevant Orders   TSH + free T4   Healthcare maintenance    Flu vaccine today. Would recommend Shingrix to be given at pharmacy if insurance will cover (otherwise quite expensive > $200).      Return in about 3 months (around 01/02/2022).     SJanene Madeira MSN, NP-C [facility] Centerfor [facility]ious DAnsonvillePager: [phone]: [phone] 10/03/21  12:11 PM

## 2021-10-03 NOTE — Assessment & Plan Note (Signed)
Not clear how she has a 50 mcg and 25 mcg rxs but she has felt better on this higher dose of synthroid. Will repeat her TSH today to see what her levels show. Recommend she follow up with PCP in 59mto recheck to ensure she is still euthyroid on 762mgc / d dose.

## 2021-10-03 NOTE — Assessment & Plan Note (Signed)
Flu vaccine today. Would recommend Shingrix to be given at pharmacy if insurance will cover (otherwise quite expensive > $200).

## 2021-10-03 NOTE — Telephone Encounter (Signed)
[facility] [place] [facility] [address] and requested that all of patient's medications be transferred to that location. Spoke with patient's daughter, [name], to update her.   Beryle Flock, RN

## 2021-10-03 NOTE — Assessment & Plan Note (Addendum)
Uncontrolled with VL 137,000 copies.  Continue biktarvy in setting of poor adherence d/t insurance and pharmacy issues. Fortunately, she is tolerating the medication well without side effects. No drug interactions identified. Pertinent lab tests ordered today.  Reminded about ICAP re-enrollment in July / January.   No dental needs today.  No concern over anxious/depressed mood.  Sexual health and family planning discussed - no needs - she is s/p menopause Pap smear abnormal with LSIL - colpo pending.  Vaccines updated today - see health maintenance section.    Return in about 3 months (around 01/02/2022).

## 2021-10-03 NOTE — Patient Instructions (Addendum)
Nice to see you both today.   Continue the Biktarvy and Bactrim once a day TOGETHER.   Please continue the Synthroid 50 mcg + 25 mcg TOGETHER in the morning before food.   Continue your Lisinopril/HCTZ and your Amlodipine TOGETHER for your blood pressure  Continue the cholesterol medicine every day.   We need to see where we can send all of your prescriptions based on your insurance coverage.   Please come back in 3 months so we can see how your labs look.

## 2021-10-03 NOTE — Assessment & Plan Note (Signed)
Reviewed previous genosure - no mutations detected that would warrant med change. She has historically had trouble with getting refills from pharmacy timely with her daughters busy schedule. This was at one point corrected with mail order pharmacy however now she has different insurance. Will change bactrim and biktarvy to local pharmacy at their request to facilitate ease of refills.  Continue Bactrim daily for ppx. No concern for OI today related to advanced HIV/AIDS.

## 2021-10-03 NOTE — Telephone Encounter (Signed)
-----   Message from Shumway Callas, NP sent at 10/03/2021 12:03 PM EDT ----- [facility] [place] [facility] - I sent her biktarvy and bactrim rx's there for her. Can you let her daughter know we are a go and to [facility] [address] to transfer her other prescriptions to that new [place] location please - thank you!

## 2021-10-03 NOTE — Assessment & Plan Note (Signed)
Continue high intensity statin given h/o HIV and previous stroke for secondary prevention.

## 2021-10-04 ENCOUNTER — Telehealth: Payer: Self-pay | Admitting: Family

## 2021-10-04 ENCOUNTER — Ambulatory Visit: Payer: 59 | Admitting: Pharmacist

## 2021-10-04 ENCOUNTER — Telehealth: Payer: Self-pay | Admitting: Infectious Diseases

## 2021-10-04 NOTE — Telephone Encounter (Signed)
[name]'s TSH is overly suppressed  0.06. She has an appointment with Vinson Moselle Ausdall in the [facility] clinic this afternoon.   I reached out to him to see if we can have her reduce back to 25 mcg once daily and stop the additional 50 mcg. I have a suspicion her last elevated levels were off her synthroid in looking back at her fill history.   Annie Main will kindly reach back out to me if he is not able to meet with her today.    [name], MSN, NP-C [facility] for Infectious Disease [name] Pager: [phone] Office: [phone] [facility] Main Line: [place] Communication Welcome

## 2021-10-04 NOTE — Telephone Encounter (Signed)
Copied from [place] [id number]. Topic: Appointment Scheduling - Scheduling Inquiry for Clinic >> Oct 04, 2021  2:45 PM Erskine Squibb wrote: Reason for CRM: The patient called in stating she went to wrong location for her appt with Lurena Joiner and needs to reschedule. Please assist patient further to reschedule   Lurena Joiner, can look at your schedule and see if you can work this patient sooner than the Nov. Appointment that I found?

## 2021-10-08 LAB — HIV-1 RNA QUANT-NO REFLEX-BLD
HIV 1 RNA Quant: 42 Copies/mL — ABNORMAL HIGH
HIV-1 RNA Quant, Log: 1.63 Log cps/mL — ABNORMAL HIGH

## 2021-10-08 LAB — T4, FREE: Free T4: 1.5 ng/dL (ref 0.8–1.8)

## 2021-10-08 LAB — TSH+FREE T4: TSH W/REFLEX TO FT4: 0.06 mIU/L — ABNORMAL LOW (ref 0.40–4.50)

## 2021-10-08 NOTE — Telephone Encounter (Signed)
Looks like she missed her appt with [facility] team.   Can someone please call her daughter, [name] to inform her of the recommended changes to her synthroid? Her Primary care team was looking at an earlier appointment for her as well.   See details in previous phone note

## 2021-10-08 NOTE — Progress Notes (Signed)
HIV rna is much better indicating good compliance with medication. Will continue follow up q24m

## 2021-10-10 ENCOUNTER — Encounter: Payer: 59 | Admitting: Advanced Practice Midwife

## 2021-10-11 NOTE — Telephone Encounter (Signed)
Bliss friend,   Do I have anything available sooner than Nov? If not, she will just have to reschedule to my first available even if in November.

## 2021-10-22 NOTE — Progress Notes (Signed)
Erroneous encounter-disregard

## 2021-10-25 ENCOUNTER — Encounter: Payer: 59 | Admitting: Family

## 2021-10-25 DIAGNOSIS — Z8673 Personal history of transient ischemic attack (TIA), and cerebral infarction without residual deficits: Secondary | ICD-10-CM

## 2021-10-25 DIAGNOSIS — E785 Hyperlipidemia, unspecified: Secondary | ICD-10-CM

## 2021-10-25 DIAGNOSIS — I10 Essential (primary) hypertension: Secondary | ICD-10-CM

## 2021-10-25 DIAGNOSIS — Z789 Other specified health status: Secondary | ICD-10-CM

## 2021-10-25 DIAGNOSIS — E0590 Thyrotoxicosis, unspecified without thyrotoxic crisis or storm: Secondary | ICD-10-CM

## 2021-10-26 DIAGNOSIS — Z8249 Family history of ischemic heart disease and other diseases of the circulatory system: Secondary | ICD-10-CM | POA: Diagnosis not present

## 2021-10-26 DIAGNOSIS — I10 Essential (primary) hypertension: Secondary | ICD-10-CM | POA: Diagnosis not present

## 2021-10-26 DIAGNOSIS — Z833 Family history of diabetes mellitus: Secondary | ICD-10-CM | POA: Diagnosis not present

## 2021-10-26 DIAGNOSIS — E039 Hypothyroidism, unspecified: Secondary | ICD-10-CM | POA: Diagnosis not present

## 2021-10-26 DIAGNOSIS — I69334 Monoplegia of upper limb following cerebral infarction affecting left non-dominant side: Secondary | ICD-10-CM | POA: Diagnosis not present

## 2021-10-31 ENCOUNTER — Encounter: Payer: Self-pay | Admitting: Obstetrics and Gynecology

## 2021-10-31 ENCOUNTER — Ambulatory Visit (INDEPENDENT_AMBULATORY_CARE_PROVIDER_SITE_OTHER): Payer: 59 | Admitting: Obstetrics and Gynecology

## 2021-10-31 ENCOUNTER — Other Ambulatory Visit (HOSPITAL_COMMUNITY)
Admission: RE | Admit: 2021-10-31 | Discharge: 2021-10-31 | Disposition: A | Discharge status: Home / Self Care | Payer: 59 | Source: Ambulatory Visit | Attending: Obstetrics and Gynecology | Admitting: Obstetrics and Gynecology

## 2021-10-31 VITALS — BP 159/80 | HR 77 | Wt 133.4 lb

## 2021-10-31 DIAGNOSIS — D069 Carcinoma in situ of cervix, unspecified: Secondary | ICD-10-CM | POA: Diagnosis not present

## 2021-10-31 DIAGNOSIS — R87619 Unspecified abnormal cytological findings in specimens from cervix uteri: Secondary | ICD-10-CM

## 2021-10-31 HISTORY — PX: COLPOSCOPY W/ BIOPSY / CURETTAGE: SUR283

## 2021-10-31 HISTORY — PX: ENDOMETRIAL BIOPSY: PRO73

## 2021-10-31 NOTE — Progress Notes (Signed)
Patient presents for Colpo.

## 2021-10-31 NOTE — Procedures (Signed)
Colposcopy Procedure Note  Pre-operative Diagnosis:  HIV+ 09/04/2021 pap with atypical endocervical cells, favor neoplastic with EC/TZ unable to be determinded b/c of atrophy but it was satisfactory for evaluation. HPV+, 16/18/45 negative 11/2015 pap: cytology and HPV negative  Post-operative Diagnosis: Normal appearing cervix   Procedure Details  The risks (including infection, bleeding, pain) and benefits of the procedure were explained to the patient and written informed consent was obtained.  The patient was placed in the dorsal lithotomy position. A Graves was speculum inserted in the vagina, and the cervix was visualized.  Acetic acid staining was done and the cervix was viewed with green filter; lugol's staining with green filter was also done.   Biopsy from 10, 2, 4 and 8 o'clock and then single toothed tenaculum applied and endocervical curettage in all four quadrants done. During the ECC, I felt I probably got into the endometrial cavity and because of her unusual pap result so an endometrial biopsy was done after cleansing. She sounded to 5.5-6cm and had scanty material withdrawn on two attempts. There was no bleeding after procedure.   Findings: Normal appearing cervix  Adequate: No  Specimens: 10, 2, 4 and 8 o'clock cervical biopsies (sent together), endocervical curettage and endometrial biopsy  Condition: Stable  Complications: None  Plan: The patient was advised to call for any fever or for prolonged or severe pain or bleeding. She was advised to use OTC analgesics as needed for mild to moderate pain. She was advised to avoid vaginal intercourse for 48 hours or until the bleeding has completely stopped.  In person interpreter used  RTC 2 weeks to go over results with in person interpreter.   Durene Romans MD Attending Center for [employer] [facility])

## 2021-11-01 LAB — SURGICAL PATHOLOGY

## 2021-11-06 NOTE — Progress Notes (Signed)
Erroneous encounter-disregard

## 2021-11-14 ENCOUNTER — Ambulatory Visit: Payer: 59 | Admitting: Obstetrics and Gynecology

## 2021-11-14 ENCOUNTER — Encounter: Payer: Self-pay | Admitting: Obstetrics and Gynecology

## 2021-11-15 ENCOUNTER — Ambulatory Visit: Payer: 59 | Admitting: Pharmacist

## 2021-11-15 ENCOUNTER — Encounter: Payer: 59 | Admitting: Family

## 2021-11-18 NOTE — Progress Notes (Signed)
GYN Note Patient did not keep her colposcopy biopsy follow up appointment for 11/14/2021.  Office to try and contact patient for visit with asap visit with any GYN surgeon to go over results. She probably needs a LEEP or cold knife cone in the [place], Jr MD Attending Center for [employer] [facility])

## 2021-11-19 ENCOUNTER — Telehealth: Payer: Self-pay

## 2021-11-19 NOTE — Telephone Encounter (Signed)
Called pt to notify of appt to discuss her biopsy results. Pt vm full. Called 2x

## 2021-11-20 ENCOUNTER — Telehealth: Payer: Self-pay

## 2021-11-20 ENCOUNTER — Telehealth: Payer: Self-pay | Admitting: *Deleted

## 2021-11-20 NOTE — Telephone Encounter (Signed)
Attempted to call pt, no answer and voicemail full

## 2021-11-20 NOTE — Telephone Encounter (Signed)
Called pt to notify of appt to discuss her biopsy results. Pt vm full.

## 2021-11-25 NOTE — Progress Notes (Signed)
Erroneous encounter-disregard

## 2021-12-02 ENCOUNTER — Encounter: Payer: 59 | Admitting: Family

## 2021-12-02 DIAGNOSIS — Z8673 Personal history of transient ischemic attack (TIA), and cerebral infarction without residual deficits: Secondary | ICD-10-CM

## 2021-12-02 DIAGNOSIS — E785 Hyperlipidemia, unspecified: Secondary | ICD-10-CM

## 2021-12-02 DIAGNOSIS — I10 Essential (primary) hypertension: Secondary | ICD-10-CM

## 2021-12-02 DIAGNOSIS — Z789 Other specified health status: Secondary | ICD-10-CM

## 2021-12-02 DIAGNOSIS — E039 Hypothyroidism, unspecified: Secondary | ICD-10-CM

## 2021-12-03 NOTE — Progress Notes (Signed)
Patient ID: Sandra [name], female    DOB: 17-Mar-1955  MRN: [id number]  CC: Chronic Care Management   Subjective: Sandra [name] is a 66 y.o. female who presents for chronic care management. She is accompanied by her son-in-law who serves at interpreter and part-historian.  Her concerns today include:  - Has not taken blood pressure medications in almost 1 week due to needing refills. No issues/concerns with medications. Denies red flag symptoms. Checking blood pressures infrequently in the home setting. Recalls last blood pressure was 140's/60's. Has not established with Cardiology since last visit. [place] appointment. - Doing well on cholesterol medication, no issues/concerns. - Doing well on thyroid medication, no issues/concerns. Son-in-law states he thinks patient has an appointment with Endocrinology scheduled soon.  - Son-in-law reports patient has an appointment with [facility] on 12/20/2021.  Patient Active Problem List   Diagnosis Date Noted   Healthcare maintenance 09/02/2021   Hypothyroidism 07/23/2021   Prediabetes 07/23/2021   Pedal edema 04/17/2020   Chronic pain of left knee 05/16/2019   HIV (human immunodeficiency virus infection) ([place]) 01/19/2019   AIDS (acquired immune deficiency syndrome) ([place]) 11/30/2017   CVA (cerebral vascular accident) ([place]) 11/25/2017   Stroke ([place]) 12/08/2014   Left-sided weakness    HLD (hyperlipidemia)    Essential hypertension    Cerebrovascular accident (CVA) due to occlusion of cerebral artery ([place])      Current Outpatient Medications on File Prior to Visit  Medication Sig Dispense Refill   bictegravir-emtricitabine-tenofovir AF (BIKTARVY) 50-200-25 MG TABS tablet Take 1 tablet by mouth daily. 30 tablet 11   lisinopril-hydrochlorothiazide (ZESTORETIC) 20-25 MG tablet Take 1 tablet by mouth daily. 30 tablet 3   sulfamethoxazole-trimethoprim (BACTRIM)  400-80 MG tablet Take 1 tablet by mouth daily. 30 tablet 5   valACYclovir (VALTREX) 1000 MG tablet Take 1 tablet (1,000 mg total) by mouth 3 (three) times daily. 21 tablet 0   No current facility-administered medications on file prior to visit.    No Known Allergies  Social History   Socioeconomic History   Marital status: Single    Spouse name: Not on file   Number of children: Not on file   Years of education: Not on file   Highest education level: Not on file  Occupational History   Not on file  Tobacco Use   Smoking status: Never    Passive exposure: Never   Smokeless tobacco: Never  Vaping Use   Vaping Use: Never used  Substance and Sexual Activity   Alcohol use: Never   Drug use: Never   Sexual activity: Not Currently  Other Topics Concern   Not on file  Social History Narrative   ** Merged History Encounter **       Social Determinants of Health   Financial Resource Strain: Low Risk  (11/25/2017)   Overall Financial Resource Strain (CARDIA)    Difficulty of Paying Living Expenses: Not hard at all  Food Insecurity: No Food Insecurity (11/25/2017)   Hunger Vital Sign    Worried About Running Out of Food in the Last Year: Never true    [place] in the Last Year: Never true  Transportation Needs: No Transportation Needs (11/25/2017)   PRAPARE - Hydrologist (Medical): No    Lack of Transportation (Non-Medical): No  Physical Activity: Unknown (11/25/2017)   Exercise Vital Sign    Days of Exercise  per Week: Patient refused    Minutes of Exercise per Session: Patient refused  Stress: No Stress Concern Present (11/25/2017)   [place]    Feeling of Stress : Not at all  Social Connections: Unknown (11/25/2017)   Social Connection and Isolation Panel [NHANES]    Frequency of Communication with Friends and Family: More than three times a week    Frequency of  Social Gatherings with Friends and Family: More than three times a week    Attends Religious Services: More than 4 times per year    Active Member of [employer] or Organizations: No    Attends Archivist Meetings: Never    Marital Status: Patient refused  Intimate Partner Violence: Unknown (11/25/2017)   Humiliation, Afraid, Rape, and Kick questionnaire    Fear of Current or Ex-Partner: Patient refused    Emotionally Abused: Patient refused    Physically Abused: Patient refused    Sexually Abused: Patient refused    Family History  Problem Relation Age of Onset   Stroke Mother    Stroke Sister    Colon cancer Neg Hx    Esophageal cancer Neg Hx    Rectal cancer Neg Hx    Stomach cancer Neg Hx     Past Surgical History:  Procedure Laterality Date   COLPOSCOPY W/ BIOPSY / CURETTAGE  10/31/2021   ENDOMETRIAL BIOPSY  10/31/2021   EYE SURGERY      ROS: Review of Systems Negative except as stated above  PHYSICAL EXAM: BP (!) 166/79   Pulse 73   Temp 98.3 F (36.8 C)   Resp 16   Wt 130 lb (59 kg)   SpO2 98%   BMI 28.12 kg/m   Physical Exam HENT:     Head: Normocephalic and atraumatic.  Eyes:     Extraocular Movements: Extraocular movements intact.     Conjunctiva/sclera: Conjunctivae normal.     Pupils: Pupils are equal, round, and reactive to light.  Cardiovascular:     Rate and Rhythm: Normal rate and regular rhythm.     Pulses: Normal pulses.     Heart sounds: Normal heart sounds.  Pulmonary:     Effort: Pulmonary effort is normal.     Breath sounds: Normal breath sounds.  Musculoskeletal:     Cervical back: Normal range of motion and neck supple.  Neurological:     General: No focal deficit present.     Mental Status: She is alert and oriented to person, place, and time.  Psychiatric:        Mood and Affect: Mood normal.        Behavior: Behavior normal.     ASSESSMENT AND PLAN: 1. Uncontrolled hypertension - Blood pressure not at goal during  today's visit. Patient asymptomatic without chest pressure, chest pain, palpitations, shortness of breath, worst headache of life, and any additional red flag symptoms. - Since last visit patient did not establish with Cardiology because she missed the referral call due to [place].  - Increase Lisinopril from 20 mg daily to 40 mg daily.  - Continue Amlodipine as prescribed.  - Continue Hydrochlorothiazide as prescribed.  - Counseled on blood pressure goal of less than 140/90, low-sodium, DASH diet, medication compliance, 150 minutes of moderate intensity exercise per week as tolerated. Discussed medication compliance, adverse effects. - Update BMP.  - Referral to Cardiology for further evaluation/management. During the interim patient will follow-up with me in  2 weeks or sooner if needed until established with Cardiology.  - Basic Metabolic Panel - amLODipine (NORVASC) 10 MG tablet; TAKE 1 TABLET(10 MG) BY MOUTH DAILY  Dispense: 30 tablet; Refill: 3 - lisinopril (ZESTRIL) 40 MG tablet; Take 1 tablet (40 mg total) by mouth daily.  Dispense: 30 tablet; Refill: 2 - hydrochlorothiazide (HYDRODIURIL) 25 MG tablet; Take 1 tablet (25 mg total) by mouth daily.  Dispense: 30 tablet; Refill: 2 - Ambulatory referral to Cardiology  2. Hyperlipidemia, unspecified hyperlipidemia type - Continue Simvastatin as prescribed.  - Referral to Cardiology for further evaluation/management. - simvastatin (ZOCOR) 40 MG tablet; Take 1 tablet (40 mg total) by mouth daily at 6 PM.  Dispense: 30 tablet; Refill: 2 - Ambulatory referral to Cardiology  3. History of CVA (cerebrovascular accident) - Continue Simvastatin as prescribed.  - Referral to Neurology for further evaluation/management. - simvastatin (ZOCOR) 40 MG tablet; Take 1 tablet (40 mg total) by mouth daily at 6 PM.  Dispense: 30 tablet; Refill: 2 - Ambulatory referral to Neurology  4. Hypothyroidism, unspecified type - Continue  Levothyroxine as prescribed.  - Update TSH.  - Referral to Endocrinology for further evaluation/management. - TSH - levothyroxine (SYNTHROID) 25 MCG tablet; Take 1 tablet (25 mcg total) by mouth daily.  Dispense: 30 tablet; Refill: 2 - Ambulatory referral to Endocrinology  5. Language barrier - Patient accompanied by her son-in-law who serves as interpreter and part-historian.   Patient was given the opportunity to ask questions.  Patient verbalized understanding of the plan and was able to repeat key elements of the plan. Patient was given clear instructions to go to Emergency Department or return to medical center if symptoms don't improve, worsen, or new problems develop.The patient verbalized understanding.   Orders Placed This Encounter  Procedures   Basic Metabolic Panel   TSH   Ambulatory referral to Cardiology   Ambulatory referral to Endocrinology   Ambulatory referral to Neurology     Requested Prescriptions   Signed Prescriptions Disp Refills   amLODipine (NORVASC) 10 MG tablet 30 tablet 3    Sig: TAKE 1 TABLET(10 MG) BY MOUTH DAILY   levothyroxine (SYNTHROID) 25 MCG tablet 30 tablet 2    Sig: Take 1 tablet (25 mcg total) by mouth daily.   simvastatin (ZOCOR) 40 MG tablet 30 tablet 2    Sig: Take 1 tablet (40 mg total) by mouth daily at 6 PM.   lisinopril (ZESTRIL) 40 MG tablet 30 tablet 2    Sig: Take 1 tablet (40 mg total) by mouth daily.   hydrochlorothiazide (HYDRODIURIL) 25 MG tablet 30 tablet 2    Sig: Take 1 tablet (25 mg total) by mouth daily.    Follow-up with primary provider as scheduled.   Camillia Herter, NP

## 2021-12-06 ENCOUNTER — Ambulatory Visit (INDEPENDENT_AMBULATORY_CARE_PROVIDER_SITE_OTHER): Payer: 59 | Admitting: Family

## 2021-12-06 ENCOUNTER — Telehealth: Payer: Self-pay | Admitting: Family

## 2021-12-06 VITALS — BP 166/79 | HR 73 | Temp 98.3°F | Resp 16 | Wt 130.0 lb

## 2021-12-06 DIAGNOSIS — I10 Essential (primary) hypertension: Secondary | ICD-10-CM

## 2021-12-06 DIAGNOSIS — Z8673 Personal history of transient ischemic attack (TIA), and cerebral infarction without residual deficits: Secondary | ICD-10-CM | POA: Diagnosis not present

## 2021-12-06 DIAGNOSIS — E039 Hypothyroidism, unspecified: Secondary | ICD-10-CM

## 2021-12-06 DIAGNOSIS — Z789 Other specified health status: Secondary | ICD-10-CM | POA: Diagnosis not present

## 2021-12-06 DIAGNOSIS — Z13228 Encounter for screening for other metabolic disorders: Secondary | ICD-10-CM

## 2021-12-06 DIAGNOSIS — E785 Hyperlipidemia, unspecified: Secondary | ICD-10-CM | POA: Diagnosis not present

## 2021-12-06 MED ORDER — LEVOTHYROXINE SODIUM 25 MCG PO TABS: 25.0000 ug | ORAL_TABLET | Freq: Every day | ORAL | 2 refills | Status: DC

## 2021-12-06 MED ORDER — SIMVASTATIN 40 MG PO TABS: 40.0000 mg | ORAL_TABLET | Freq: Every day | ORAL | 2 refills | Status: DC

## 2021-12-06 MED ORDER — HYDROCHLOROTHIAZIDE 25 MG PO TABS: 25.0000 mg | ORAL_TABLET | Freq: Every day | ORAL | 2 refills | Status: DC

## 2021-12-06 MED ORDER — LISINOPRIL 40 MG PO TABS: 40.0000 mg | ORAL_TABLET | Freq: Every day | ORAL | 2 refills | Status: DC

## 2021-12-06 MED ORDER — AMLODIPINE BESYLATE 10 MG PO TABS: ORAL_TABLET | ORAL | 3 refills | Status: DC

## 2021-12-06 NOTE — Progress Notes (Signed)
.  Pt presents for chronic care management   -needs refills on Amlodipine, Lisinopril-Hctz, Levothyroxine unsure if 40mg or 50 mcg, Simvastatin

## 2021-12-06 NOTE — Progress Notes (Unsigned)
   GYNECOLOGY OFFICE VISIT NOTE  History:   Sandra [name] is a 66 y.o. [id number] here today for discussion of her biopsies from her colposcopy.   Pap History:  2017: Pap wnl, HPV negative 09/04/21: HPV+ (non 16/18/45), atypical endocervical cells favor neoplastic  10/31/21: ECC and EMB positive for CIN3, biopsies at 10, 2, 4 and 8 were c/w CIN2-3.      Past Medical History:  Diagnosis Date   Arthritis    Headache    HIV (human immunodeficiency virus infection) ([place]) 01/19/2019   Hypertension    Hypothyroidism 07/23/2021   Stroke [facility])     Past Surgical History:  Procedure Laterality Date   COLPOSCOPY W/ BIOPSY / CURETTAGE  10/31/2021   ENDOMETRIAL BIOPSY  10/31/2021   EYE SURGERY      The following portions of the patient's history were reviewed and updated as appropriate: allergies, current medications, past family history, past medical history, past social history, past surgical history and problem list.   Health Maintenance:   Normal mammogram on 2019. MXR was ordered by PCP.    Review of Systems:  Pertinent items noted in HPI and remainder of comprehensive ROS otherwise negative.  Physical Exam:  There were no vitals taken for this visit. CONSTITUTIONAL: Well-developed, well-nourished female in no acute distress.  HEENT:  Normocephalic, atraumatic. External right and left ear normal. No scleral icterus.  NECK: Normal range of motion, supple, no masses noted on observation SKIN: No rash noted. Not diaphoretic. No erythema. No pallor. MUSCULOSKELETAL: Normal range of motion. No edema noted. NEUROLOGIC: Alert and oriented to person, place, and time. Normal muscle tone coordination. No cranial nerve deficit noted. PSYCHIATRIC: Normal mood and affect. Normal behavior. Normal judgment and thought content.  PELVIC: Deferred  Labs and Imaging No results found for this or any previous visit (from the past 168 hour(s)). No results found.  Assessment and Plan:   1. CIN III  (cervical intraepithelial neoplasia grade III) with severe dysplasia Discussed given finding of CIN2-3 and that it is positive in the canal, I would recommend a LEEP procedure. We discussed the risk of positive margins of abnormal cells due to ECC being positive. An alternative would be to have a cold knife cone which would potentially remove more cells. Alternatively, given her history of HIV/AIDS one option would also be to have a hysterectomy. However, given that she does not have a long standing history of abnormal pap smears, I think the safest and best first step would be to attempt a LEEP procedure. Discussed the procedure and process and that it is done in the office. Reviewed limitations following the procedure and side effects.    Diagnoses and all orders for this visit:  CIN III (cervical intraepithelial neoplasia grade III) with severe dysplasia    Routine preventative health maintenance measures emphasized. Please refer to After Visit Summary for other counseling recommendations.   No follow-ups on file.  Radene Gunning, MD, [place] for [facility], [place]

## 2021-12-07 LAB — BASIC METABOLIC PANEL
BUN/Creatinine Ratio: 11 — ABNORMAL LOW (ref 12–28)
BUN: 11 mg/dL (ref 8–27)
CO2: 23 mmol/L (ref 20–29)
Calcium: 9.8 mg/dL (ref 8.7–10.3)
Chloride: 100 mmol/L (ref 96–106)
Creatinine, Ser: 0.99 mg/dL (ref 0.57–1.00)
Glucose: 100 mg/dL — ABNORMAL HIGH (ref 70–99)
Potassium: 4.8 mmol/L (ref 3.5–5.2)
Sodium: 139 mmol/L (ref 134–144)
eGFR: 63 mL/min/{1.73_m2} (ref >59)

## 2021-12-07 LAB — TSH: TSH: 1.62 u[IU]/mL (ref 0.450–4.500)

## 2021-12-10 ENCOUNTER — Encounter: Payer: Self-pay | Admitting: Obstetrics and Gynecology

## 2021-12-10 ENCOUNTER — Ambulatory Visit: Payer: 59 | Admitting: Obstetrics and Gynecology

## 2021-12-10 VITALS — BP 131/75 | HR 68 | Wt 131.0 lb

## 2021-12-10 DIAGNOSIS — D069 Carcinoma in situ of cervix, unspecified: Secondary | ICD-10-CM

## 2021-12-19 ENCOUNTER — Encounter: Payer: Self-pay | Admitting: Infectious Diseases

## 2021-12-19 ENCOUNTER — Other Ambulatory Visit: Payer: Self-pay

## 2021-12-19 ENCOUNTER — Ambulatory Visit (INDEPENDENT_AMBULATORY_CARE_PROVIDER_SITE_OTHER): Payer: 59 | Admitting: Infectious Diseases

## 2021-12-19 VITALS — BP 147/78 | HR 65 | Resp 16 | Ht 61.0 in | Wt 133.0 lb

## 2021-12-19 DIAGNOSIS — B20 Human immunodeficiency virus [HIV] disease: Secondary | ICD-10-CM

## 2021-12-19 DIAGNOSIS — R69 Illness, unspecified: Secondary | ICD-10-CM | POA: Diagnosis not present

## 2021-12-19 NOTE — Assessment & Plan Note (Addendum)
She and her daughter report improved adherence with her Biktarvy and pharmacy has been delivering medication timely.  No side effects or drug interactions noted.  We discussed her last viral load of 42 is a good sign that her medicine is working.  Reinforced that she needs to keep this case so her immune system can heal and recover.  Flu vaccine provided today.  Agree with management of abnormal Pap smear with LEEP procedure.  We discussed this today.  Appreciate Dr. Josefine Class care.  Return to clinic in 3 to 4 months to again ensure adherence and immune system recovery.   Will get her set up with a dental clinic here in the clinic to help with the extractions and dentures.

## 2021-12-19 NOTE — Assessment & Plan Note (Signed)
Last T-cell count in August was 110.  Will continue her Bactrim today and redraw level.

## 2021-12-19 NOTE — Progress Notes (Signed)
Name: Sandra [name]  DOB: 02-08-1955 MRN: [id number] PCP: Camillia Herter, NP    Subjective:   CC: Routine HIV follow up care.      HPI: Sandra [name] is a 66 y.o. female with HIV, stage 3 at diagnosis (11-2017 during hospitalization for acute stroke) with CD4 nadir 50 on once daily Biktarvy prescribed for management.   Her daughter is here today as a Twi interpretor was not available for the visit.   Met with primary care provider this month - multiple referrals placed for management of chronic medical conditions.   Dr. Damita Dunnings with GYN saw her 12/05 - h/o CIN3; planned for LEEP coming up soon. Hysterectomy was also considered given h/o AIDS.   I saw her last 3 months ago -  her viral load was down to 42 copies indicating much improved adherence to her medicines and good control on Biktarvy.  She and her daughter agree that she has been able to get her medications filled regularly without any delay or interruption.  Continues on Bactrim for prophylaxis as well.   Needs flu vaccine today. Pap smear results abnormal with need for colposcopy - pending scheduling now. She has some pain today after she got some soap in her eye. She is requesting her COVID vaccine next visit.   Planning on extractions for all of her teeth. Had to go to the [facility] free clinic for dental care and wondering if we have an option to help with their needs - just too far and too full of other patients. Not comfortable driving that far.   She would also like to know if there is a program to help with cataract removal. 5 years ago she had right one taken care of and now the left needs help. She has pain occasionally in the left eye.       Review of Systems  Constitutional:  Negative for chills, fever, malaise/fatigue and weight loss.  HENT:  Negative for sore throat.   Respiratory:  Negative for cough, sputum production and shortness of breath.   Cardiovascular: Negative.   Gastrointestinal:  Negative for  abdominal pain, diarrhea and vomiting.  Musculoskeletal:  Negative for joint pain, myalgias and neck pain.  Skin:  Negative for rash.  Neurological:  Negative for tingling, focal weakness and headaches.  Psychiatric/Behavioral:  Negative for depression and substance abuse. The patient is not nervous/anxious.      Past Medical History:  Diagnosis Date   Arthritis    Headache    HIV (human immunodeficiency virus infection) ([place]) 01/19/2019   Hypertension    Hypothyroidism 07/23/2021   Stroke [facility])     Outpatient Medications Prior to Visit  Medication Sig Dispense Refill   amLODipine (NORVASC) 10 MG tablet TAKE 1 TABLET(10 MG) BY MOUTH DAILY 30 tablet 3   bictegravir-emtricitabine-tenofovir AF (BIKTARVY) 50-200-25 MG TABS tablet Take 1 tablet by mouth daily. 30 tablet 11   hydrochlorothiazide (HYDRODIURIL) 25 MG tablet Take 1 tablet (25 mg total) by mouth daily. 30 tablet 2   levothyroxine (SYNTHROID) 25 MCG tablet Take 1 tablet (25 mcg total) by mouth daily. 30 tablet 2   lisinopril (ZESTRIL) 40 MG tablet Take 1 tablet (40 mg total) by mouth daily. 30 tablet 2   simvastatin (ZOCOR) 40 MG tablet Take 1 tablet (40 mg total) by mouth daily at 6 PM. 30 tablet 2   sulfamethoxazole-trimethoprim (BACTRIM) 400-80 MG tablet Take 1 tablet by mouth daily. 30 tablet 5   valACYclovir (VALTREX) 1000 MG  tablet Take 1 tablet (1,000 mg total) by mouth 3 (three) times daily. 21 tablet 0   No facility-administered medications prior to visit.     No Known Allergies   Social History   Tobacco Use   Smoking status: Never    Passive exposure: Never   Smokeless tobacco: Never  Vaping Use   Vaping Use: Never used  Substance Use Topics   Alcohol use: Never   Drug use: Never     Social History   Substance and Sexual Activity  Sexual Activity Not Currently     Objective:   Vitals:   12/19/21 0838 12/19/21 0911  BP: (!) 157/82 (!) 147/78  Pulse: 65   Resp: 16   SpO2: 100%   Weight: 133  lb (60.3 kg)   Height: _0  (1.549 m)     Body mass index is 25.13 kg/m.   Physical Exam HENT:     Mouth/Throat:     Mouth: No oral lesions.     Dentition: No dental abscesses.  Eyes:     Comments: Left eye is injected and intercanthus.  No pain with any EOM  Cardiovascular:     Rate and Rhythm: Normal rate and regular rhythm.     Heart sounds: Normal heart sounds.  Pulmonary:     Effort: Pulmonary effort is normal.     Breath sounds: Normal breath sounds.  Abdominal:     General: There is no distension.     Palpations: Abdomen is soft.     Tenderness: There is no abdominal tenderness.  Musculoskeletal:        General: No tenderness. Normal range of motion.  Lymphadenopathy:     Cervical: No cervical adenopathy.  Skin:    General: Skin is warm and dry.     Findings: No rash.  Neurological:     Mental Status: She is alert and oriented to person, place, and time.  Psychiatric:        Judgment: Judgment normal.     Lab Results Lab Results  Component Value Date   WBC 6.0 07/22/2021   HGB 13.3 07/22/2021   HCT 38.0 07/22/2021   MCV 92 07/22/2021   PLT 221 07/22/2021    Lab Results  Component Value Date   CREATININE 0.99 12/06/2021   BUN 11 12/06/2021   NA 139 12/06/2021   K 4.8 12/06/2021   CL 100 12/06/2021   CO2 23 12/06/2021    Lab Results  Component Value Date   ALT 26 07/22/2021   AST 20 07/22/2021   ALKPHOS 46 07/22/2021   BILITOT 0.3 07/22/2021    Lab Results  Component Value Date   CHOL 224 (H) 07/22/2021   HDL 95 07/22/2021   LDLCALC 117 (H) 07/22/2021   TRIG 68 07/22/2021   CHOLHDL 2.4 07/22/2021   HIV 1 RNA Quant  Date Value  10/03/2021 42 Copies/mL (H)  09/02/2021 137,000 copies/mL (H)  04/17/2021 37 copies/mL (H)   CD4 T Cell Abs (/uL)  Date Value  09/02/2021 110 (L)  04/17/2021 176 (L)  10/16/2020 137 (L)     Assessment & Plan:   Patient Active Problem List   Diagnosis Date Noted   HIV (human immunodeficiency virus  infection) ([place]) 01/19/2019   AIDS (acquired immune deficiency syndrome) ([place]) 11/30/2017   Healthcare maintenance 09/02/2021   Hypothyroidism 07/23/2021   Prediabetes 07/23/2021   Pedal edema 04/17/2020   Chronic pain of left knee 05/16/2019   CVA (cerebral vascular accident) ([place]) 11/25/2017  Stroke ([place]) 12/08/2014   Left-sided weakness    HLD (hyperlipidemia)    Essential hypertension    Cerebrovascular accident (CVA) due to occlusion of cerebral artery ([place])     Problem List Items Addressed This Visit       High   HIV (human immunodeficiency virus infection) ([place]) (Chronic)    She and her daughter report improved adherence with her Biktarvy and pharmacy has been delivering medication timely.  No side effects or drug interactions noted.  We discussed her last viral load of 42 is a good sign that her medicine is working.  Reinforced that she needs to keep this case so her immune system can heal and recover.  Flu vaccine provided today.  Agree with management of abnormal Pap smear with LEEP procedure.  We discussed this today.  Appreciate Dr. Josefine Class care.  Return to clinic in 3 to 4 months to again ensure adherence and immune system recovery.   Will get her set up with a dental clinic here in the clinic to help with the extractions and dentures.       AIDS (acquired immune deficiency syndrome) ([place]) - Primary    Last T-cell count in August was 110.  Will continue her Bactrim today and redraw level.       Relevant Orders   HIV 1 RNA quant-no reflex-bld   T-helper cells (CD4) count  Return in about 3 months (around 03/20/2022).   Total encounter time: 30 minutes including direct face-to-face discussion/interpretation, review of medication she brought with her today, review of multiple care provider notes since her last office visit  [name], MSN, NP-C [facility] for [place] Pager: [phone] Office:  [phone]  12/19/21  12:56 PM

## 2021-12-19 NOTE — Patient Instructions (Addendum)
Nice to see you!  Please continue the Biktarvy and Bactrim everyday together.   Please call Joycelyn Schmid @ [phone] to schedule a dental appointment.   For cataracts I would recommend calling Dr. Zenia Resides office here in [facility]:  [address]  [phone]

## 2021-12-20 LAB — T-HELPER CELLS (CD4) COUNT (NOT AT ARMC)
CD4 % Helper T Cell: 7 % — ABNORMAL LOW (ref 33–65)
CD4 T Cell Abs: 182 /uL — ABNORMAL LOW (ref 400–1790)

## 2021-12-23 LAB — HIV-1 RNA QUANT-NO REFLEX-BLD
HIV 1 RNA Quant: 309 Copies/mL — ABNORMAL HIGH
HIV-1 RNA Quant, Log: 2.49 Log cps/mL — ABNORMAL HIGH

## 2021-12-24 ENCOUNTER — Ambulatory Visit: Payer: 59 | Attending: Family Medicine | Admitting: Pharmacist

## 2021-12-24 VITALS — BP 150/79 | HR 77

## 2021-12-24 DIAGNOSIS — I10 Essential (primary) hypertension: Secondary | ICD-10-CM

## 2021-12-24 NOTE — Progress Notes (Unsigned)
Cardiology Office Note:    Date:  12/24/2021   ID:  David Stall, DOB Oct 24, 1955, MRN [id number]  PCP:  Camillia Herter, NP   [facility] Providers Cardiologist:  None { Click to update primary MD,subspecialty MD or APP then REFRESH:1}    Referring MD: Camillia Herter, NP   No chief complaint on file. HTN  History of Present Illness:    Sandra [name] is a 66 y.o. female with a hx of HTN, CVA 12/08/2014, HLD, HIV/AIDS, hypothyroid on synthroid/TSH nl,     Cardiology Studies: TTE 11/26/2017: EF 60-65%, negative bubble for PFO, no significant valve dx  Carotid Duplex BL: 1-39% stenosis BL  TTE 12/08/2014: EF is normal, no PFO  Past Medical History:  Diagnosis Date   Arthritis    Headache    HIV (human immunodeficiency virus infection) ([place]) 01/19/2019   Hypertension    Hypothyroidism 07/23/2021   Stroke [facility])     Past Surgical History:  Procedure Laterality Date   COLPOSCOPY W/ BIOPSY / CURETTAGE  10/31/2021   ENDOMETRIAL BIOPSY  10/31/2021   EYE SURGERY      Current Medications: No outpatient medications have been marked as taking for the 12/25/21 encounter (Appointment) with Janina Mayo, MD.     Allergies:   Patient has no known allergies.   Social History   Socioeconomic History   Marital status: Single    Spouse name: Not on file   Number of children: Not on file   Years of education: Not on file   Highest education level: Not on file  Occupational History   Not on file  Tobacco Use   Smoking status: Never    Passive exposure: Never   Smokeless tobacco: Never  Vaping Use   Vaping Use: Never used  Substance and Sexual Activity   Alcohol use: Never   Drug use: Never   Sexual activity: Not Currently  Other Topics Concern   Not on file  Social History Narrative   ** Merged History Encounter **       Social Determinants of Health   Financial Resource Strain: Low Risk  (11/25/2017)   Overall Financial Resource Strain (CARDIA)     Difficulty of Paying Living Expenses: Not hard at all  Food Insecurity: No Food Insecurity (11/25/2017)   Hunger Vital Sign    Worried About Running Out of Food in the Last Year: Never true    [place] in the Last Year: Never true  Transportation Needs: No Transportation Needs (11/25/2017)   PRAPARE - Hydrologist (Medical): No    Lack of Transportation (Non-Medical): No  Physical Activity: Unknown (11/25/2017)   Exercise Vital Sign    Days of Exercise per Week: Patient refused    Minutes of Exercise per Session: Patient refused  Stress: No Stress Concern Present (11/25/2017)   [place]    Feeling of Stress : Not at all  Social Connections: Unknown (11/25/2017)   Social Connection and Isolation Panel [NHANES]    Frequency of Communication with Friends and Family: More than three times a week    Frequency of Social Gatherings with Friends and Family: More than three times a week    Attends Religious Services: More than 4 times per year    Active Member of [employer] or Organizations: No    Attends Archivist Meetings: Never    Marital Status: Patient refused  Family History: The patient's ***family history includes Stroke in her mother and sister. There is no history of Colon cancer, Esophageal cancer, Rectal cancer, or Stomach cancer.  ROS:   Please see the history of present illness.    *** All other systems reviewed and are negative.  EKGs/Labs/Other Studies Reviewed:    The following studies were reviewed today: ***  EKG:  EKG is *** ordered today.  The ekg ordered today demonstrates ***  Recent Labs: 07/22/2021: ALT 26; Hemoglobin 13.3; Platelets 221 12/06/2021: BUN 11; Creatinine, Ser 0.99; Potassium 4.8; Sodium 139; TSH 1.620  Recent Lipid Panel    Component Value Date/Time   CHOL 224 (H) 07/22/2021 1523   TRIG 68 07/22/2021 1523   HDL 95 07/22/2021  1523   CHOLHDL 2.4 07/22/2021 1523   CHOLHDL 5.6 (H) 02/11/2021 1149   VLDL 15 11/26/2017 0354   LDLCALC 117 (H) 07/22/2021 1523   LDLCALC 144 (H) 02/11/2021 1149     Risk Assessment/Calculations:   {Does this patient have ATRIAL FIBRILLATION?[phone]}  No BP recorded.  {Refresh Note OR Click here to enter BP  :1}***         Physical Exam:    VS:  There were no vitals taken for this visit.    Wt Readings from Last 3 Encounters:  12/19/21 133 lb (60.3 kg)  12/10/21 131 lb (59.4 kg)  12/06/21 130 lb (59 kg)     GEN: *** Well nourished, well developed in no acute distress HEENT: Normal NECK: No JVD; No carotid bruits LYMPHATICS: No lymphadenopathy CARDIAC: ***RRR, no murmurs, rubs, gallops RESPIRATORY:  Clear to auscultation without rales, wheezing or rhonchi  ABDOMEN: Soft, non-tender, non-distended MUSCULOSKELETAL:  No edema; No deformity  SKIN: Warm and dry NEUROLOGIC:  Alert and oriented x 3 PSYCHIATRIC:  Normal affect   ASSESSMENT:    HTN: stage II HTN. Continue norvasc 10 mg daily. On lisinopril 40 mg, will change to to losartan 50 mg daily ***, continue HCTz 25 mg daily  CVA: on simvastatin 40 mg, LDL ** mg/dL, *** asa  HIV: on Biktarvy. CD4 182  PLAN:    In order of problems listed above:  Change simvastatin 40 mg daily to crestor 40 mg daily Start asa 81 mg daily Change lisinopril to losartan 50 mg daily      {Are you ordering a CV Procedure (e.g. stress test, cath, DCCV, TEE, etc)?   Press F2        :[id number]}    Medication Adjustments/Labs and Tests Ordered: Current medicines are reviewed at length with the patient today.  Concerns regarding medicines are outlined above.  No orders of the defined types were placed in this encounter.  No orders of the defined types were placed in this encounter.   There are no Patient Instructions on file for this visit.   Signed, Janina Mayo, MD  12/24/2021 9:05 AM    [place]

## 2021-12-24 NOTE — Progress Notes (Signed)
   S:     No chief complaint on file.  66 y.o. female who presents for hypertension evaluation, education, and management.  PMH is significant for HTN, CVA with residual left-sided weakness, HIV, preDM, chronic L knee pain, and chronic pedal edema. Patient was referred and last seen by Primary Care Provider, [name], on 12/06/2021. BP was 166/79 mmHg at that visit. Of note, she was out of refills at that visit. She also reported 140s/60s at home. Medications were resumed and her lisinopril was increased to '40mg'$  daily.   Since that visit, she was seen by OBGYN on 12/10/21 and her BP was 131/75 mmHg. She was seen by [facility] on 12/19/2021 and BP was 147/78 mmHg.   Today, patient arrives in good spirits and presents with her daughter. Denies dizziness, headache, blurred vision, swelling.   Patient reports hypertension is longstanding.   Family/Social history:  FHx: stroke Tobacco: never smoker  Alcohol: none reported  Medication adherence reported. Patient has taken BP medications today.   Current antihypertensives include: amlodipine 10 mg daily, HCTZ 25 mg daily, lisinopril 40 mg daily   Reported home BP readings: none given  Patient reported dietary habits: -Endorses compliance with salt restriction  -Denies any excessive caffeine intake   Patient-reported exercise habits: none  O:  Vitals:   12/24/21 1416  BP: (!) 150/79  Pulse: 77   Last 3 Office BP readings: BP Readings from Last 3 Encounters:  12/24/21 (!) 150/79  12/19/21 (!) 147/78  12/10/21 131/75    BMET    Component Value Date/Time   NA 139 12/06/2021 1122   K 4.8 12/06/2021 1122   CL 100 12/06/2021 1122   CO2 23 12/06/2021 1122   GLUCOSE 100 (H) 12/06/2021 1122   GLUCOSE 95 02/11/2021 1149   BUN 11 12/06/2021 1122   CREATININE 0.99 12/06/2021 1122   CREATININE 0.88 02/11/2021 1149   CALCIUM 9.8 12/06/2021 1122   GFRNONAA 66 02/02/2020 1050   GFRAA 77 02/02/2020 1050    Renal function: Estimated  Creatinine Clearance: 46.6 mL/min (by C-G formula based on SCr of 0.99 mg/dL).  Clinical ASCVD: Yes  The ASCVD Risk score (Arnett DK, et al., 2019) failed to calculate for the following reasons:   The patient has a prior MI or stroke diagnosis  A/P: Hypertension diagnosed currently above goal on current medications. BP goal < 130/80 mmHg. Medication adherence appears appropriate. Discussed changing her lisinopril and HCTZ to combination valsartan-HCTZ, however, she prefers to wait until being seen by her Cardiologist tomorrow.    -Continued current antihypertensive regimen for now.  -F/u labs ordered - none -Counseled on lifestyle modifications for blood pressure control including reduced dietary sodium, increased exercise, adequate sleep. -Encouraged patient to check BP at home and bring log of readings to next visit. Counseled on proper use of home BP cuff.    Results reviewed and written information provided.    Written patient instructions provided. Patient verbalized understanding of treatment plan.  Total time in face to face counseling 30 minutes.    Follow-up:  Pharmacist prn.  Benard Halsted, PharmD, [facility] [place], [place] [phone]

## 2021-12-25 ENCOUNTER — Ambulatory Visit: Payer: 59 | Attending: Internal Medicine | Admitting: Internal Medicine

## 2021-12-25 ENCOUNTER — Encounter: Payer: Self-pay | Admitting: Internal Medicine

## 2021-12-25 VITALS — BP 169/82 | HR 62 | Ht 62.0 in | Wt 133.8 lb

## 2021-12-25 DIAGNOSIS — I10 Essential (primary) hypertension: Secondary | ICD-10-CM | POA: Diagnosis not present

## 2021-12-25 MED ORDER — ASPIRIN 81 MG PO TBEC: 81.0000 mg | DELAYED_RELEASE_TABLET | Freq: Every day | ORAL | 3 refills | Status: DC

## 2021-12-25 MED ORDER — ROSUVASTATIN CALCIUM 40 MG PO TABS: 40.0000 mg | ORAL_TABLET | Freq: Every day | ORAL | 3 refills | Status: DC

## 2021-12-25 MED ORDER — VALSARTAN-HYDROCHLOROTHIAZIDE 80-12.5 MG PO TABS: 1.0000 | ORAL_TABLET | Freq: Every day | ORAL | 3 refills | Status: DC

## 2021-12-25 NOTE — Patient Instructions (Addendum)
Medication Instructions:  Stop Lisinopril. Stop Simvastatin. Start Crestor 40 ( Take 1 tablet Daily). Start Valsartan/HCTZ 80/12.5 mg ( Take 1 Tablet Daily). Start Aspirin 81 mg ( Take 1 Tablet Daily). *If you need a refill on your cardiac medications before your next appointment, please call your pharmacy*   Lab Work: Lipid Panel, CMET In 3 months If you have labs (blood work) drawn today and your tests are completely normal, you will receive your results only by: [facility] (if you have MyChart) OR A paper copy in the mail If you have any lab test that is abnormal or we need to change your treatment, we will call you to review the results.   Testing/Procedures: No Testing   Follow-Up: At [facility], you and your health needs are our priority.  As part of our continuing mission to provide you with exceptional heart care, we have created designated Provider Care Teams.  These Care Teams include your primary Cardiologist (physician) and Advanced Practice Providers (APPs -  Physician Assistants and Nurse Practitioners) who all work together to provide you with the care you need, when you need it.  We recommend signing up for the patient portal called "MyChart".  Sign up information is provided on this After Visit Summary.  MyChart is used to connect with patients for Virtual Visits (Telemedicine).  Patients are able to view lab/test results, encounter notes, upcoming appointments, etc.  Non-urgent messages can be sent to your provider as well.   To learn more about what you can do with MyChart, go to [email or web address].    Your next appointment:   6 month(s)  The format for your next appointment:   In Person  Provider:   Janina Mayo, MD   Other Instructions Please check blood pressures for one week and write down the numbers. Please bring this paper with you to see the pharmacist and at your doctor visits.  I will have you see a pharmacist in 3 months, to  evaluate our home blood pressure measurements.  Important Information About [place]

## 2021-12-26 NOTE — Progress Notes (Signed)
Please give Sandra [name]'s daughter Sandra [name]  a call to let her know that her viral load is a little higher than I'd like to see at 300 copies. Would make sure she gets the biktarvy everyday so her body can have a true opportunity to heal. Her TCell count (Immune system) is still about the same as it was before at ~190 cells. I would like for her to continue the bactrim (preventative antibiotic) as well everyday.   Thank you!

## 2022-01-09 ENCOUNTER — Other Ambulatory Visit (HOSPITAL_COMMUNITY)
Admission: RE | Admit: 2022-01-09 | Discharge: 2022-01-09 | Disposition: A | Discharge status: Home / Self Care | Payer: 59 | Source: Ambulatory Visit | Attending: Obstetrics and Gynecology | Admitting: Obstetrics and Gynecology

## 2022-01-09 ENCOUNTER — Encounter: Payer: Self-pay | Admitting: Obstetrics and Gynecology

## 2022-01-09 ENCOUNTER — Ambulatory Visit: Payer: 59 | Admitting: Obstetrics and Gynecology

## 2022-01-09 VITALS — BP 166/83 | HR 76 | Wt 133.0 lb

## 2022-01-09 DIAGNOSIS — D061 Carcinoma in situ of exocervix: Secondary | ICD-10-CM | POA: Diagnosis not present

## 2022-01-09 DIAGNOSIS — D060 Carcinoma in situ of endocervix: Secondary | ICD-10-CM | POA: Diagnosis not present

## 2022-01-09 DIAGNOSIS — D069 Carcinoma in situ of cervix, unspecified: Secondary | ICD-10-CM | POA: Diagnosis not present

## 2022-01-09 HISTORY — PX: LEEP: PRO1013

## 2022-01-09 NOTE — Procedures (Signed)
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  The patient was placed in the dorsal lithotomy position. A coated Graves was speculum inserted in the vagina, and the cervix was visualized.  A colposcopy was performed with acetic acid and lugol's staining, with the below noted findings and no change as prior with it being inadequate and normal appearing cervix; approximately 1.5cm of cervix was coming off the vaginal wall.  The cervical stromal bed was injected with 11m of 1% lidocaine with epinephrine. Entering at 12 o'clock on the cervix and using a large, shallow Fischer Loop and a setting of 50/50 blend current, a LEEP was done. I went from 12 to 4 o'clock (clockwise) and piece came off (#1), and then I did the remainder going clock wise from 4 to 12 o'clock (#2). A portion was ent to be left at 9 to 12 o'clock and this was removed with the electrode (#3)  Next, an ECC was done and hemostasis achieved with the ball electrode on 50 coagulation current and application of Monsel's. On coagulation, the entire LEEP bed and surgical margins were thoroughly cauterized  Findings: normal appearing cervix  Adequate: no  Specimens: L#1 12 to 4 o'clock (fischer loop). #2 4 to 12 o'clock (fischer loop). #3 9 to 12 o'clock (fischer loop).  post LEEP ECC   Condition: Stable  Complications: None  Plan: The patient was advised to call for any fever or for prolonged or severe pain or bleeding. She was advised to use OTC analgesics as needed for mild to moderate pain. Pelvic rest was advised until after her four week post operative visit.    CDurene RomansMD Attending Center  for [employer](Fish farm manager

## 2022-01-09 NOTE — Progress Notes (Signed)
Patient presents for LEEP.

## 2022-01-13 LAB — SURGICAL PATHOLOGY

## 2022-01-19 NOTE — Progress Notes (Deleted)
Office Visit Note  Patient: Sandra [name]             Date of Birth: 31-Aug-1955           MRN: [id number]             PCP: Camillia Herter, NP Referring: Camillia Herter, NP Visit Date: 01/20/2022 Occupation: @GUAROCC$ @  Subjective:  No chief complaint on file.   History of Present Illness: Sandra [name] is a 67 y.o. female her for evaluation and management of osteoporosis. She has required multiple dental extraction procedures recently. She has a history of stroke. ***   09/12/21 DEXA LUMBAR SPINE (L1-L4):   BMD (in g/cm2): 0.878 T-score: -2.5 Z-score: -0.4   LEFT FEMORAL NECK:   BMD (in g/cm2): 0.790 T-score: -1.1 Z-score: 0.2   LEFT TOTAL HIP:   BMD (in g/cm2): 0.919 T-score: -0.7 Z-score: 0.3  ***  Activities of Daily Living:  Patient reports morning stiffness for *** {minute/hour:19697}.   Patient {ACTIONS;DENIES/REPORTS:21021675::"Denies"} nocturnal pain.  Difficulty dressing/grooming: {ACTIONS;DENIES/REPORTS:21021675::"Denies"} Difficulty climbing stairs: {ACTIONS;DENIES/REPORTS:21021675::"Denies"} Difficulty getting out of chair: {ACTIONS;DENIES/REPORTS:21021675::"Denies"} Difficulty using hands for taps, buttons, cutlery, and/or writing: {ACTIONS;DENIES/REPORTS:21021675::"Denies"}  No Rheumatology ROS completed.   PMFS History:  Patient Active Problem List   Diagnosis Date Noted   Healthcare maintenance 09/02/2021   Hypothyroidism 07/23/2021   Prediabetes 07/23/2021   Pedal edema 04/17/2020   Chronic pain of left knee 05/16/2019   HIV (human immunodeficiency virus infection) ([place]) 01/19/2019   AIDS (acquired immune deficiency syndrome) ([place]) 11/30/2017   CVA (cerebral vascular accident) ([place]) 11/25/2017   Stroke ([place]) 12/08/2014   Left-sided weakness    HLD (hyperlipidemia)    Essential hypertension    Cerebrovascular accident (CVA) due to occlusion of cerebral artery ([place])     Past Medical History:  Diagnosis Date   Arthritis    Headache     HIV (human immunodeficiency virus infection) ([place]) 01/19/2019   Hypertension    Hypothyroidism 07/23/2021   Stroke ([place])     Family History  Problem Relation Age of Onset   Stroke Mother    Stroke Sister    Colon cancer Neg Hx    Esophageal cancer Neg Hx    Rectal cancer Neg Hx    Stomach cancer Neg Hx    Past Surgical History:  Procedure Laterality Date   COLPOSCOPY W/ BIOPSY / CURETTAGE  10/31/2021   ENDOMETRIAL BIOPSY  10/31/2021   EYE SURGERY     LEEP  01/09/2022   Social History   Social History Narrative   ** Merged History Encounter **       Immunization History  Administered Date(s) Administered   Fluad Quad(high Dose 65+) 12/15/2018, 11/01/2020, 10/03/2021   Influenza,inj,Quad PF,6+ Mos 12/09/2014, 09/27/2019   PFIZER Comirnaty(Gray Top)Covid-19 Tri-Sucrose Vaccine 04/17/2020, 07/31/2020   PFIZER(Purple Top)SARS-COV-2 Vaccination 04/18/2019, 05/09/2019   Pneumococcal Conjugate-13 01/19/2019   Pneumococcal Polysaccharide-23 04/17/2020     Objective: Vital Signs: There were no vitals taken for this visit.   Physical Exam   Musculoskeletal Exam: ***  CDAI Exam: CDAI Score: -- Patient Global: --; Provider Global: -- Swollen: --; Tender: -- Joint Exam 01/20/2022   No joint exam has been documented for this visit   There is currently no information documented on the homunculus. Go to the Rheumatology activity and complete the homunculus joint exam.  Investigation: No additional findings.  Imaging: No results found.  Recent Labs: Lab Results  Component Value Date   WBC 6.0 07/22/2021   HGB 13.3  07/22/2021   PLT 221 07/22/2021   NA 139 12/06/2021   K 4.8 12/06/2021   CL 100 12/06/2021   CO2 23 12/06/2021   GLUCOSE 100 (H) 12/06/2021   BUN 11 12/06/2021   CREATININE 0.99 12/06/2021   BILITOT 0.3 07/22/2021   ALKPHOS 46 07/22/2021   AST 20 07/22/2021   ALT 26 07/22/2021   PROT 7.0 07/22/2021   ALBUMIN 4.9 07/22/2021   CALCIUM 9.8 12/06/2021    GFRAA 77 02/02/2020   QFTBGOLDPLUS Negative 11/27/2017    Speciality Comments: No specialty comments available.  Procedures:  No procedures performed Allergies: Patient has no known allergies.   Assessment / Plan:     Visit Diagnoses: No diagnosis found.  Orders: No orders of the defined types were placed in this encounter.  No orders of the defined types were placed in this encounter.   Face-to-face time spent with patient was *** minutes. Greater than 50% of time was spent in counseling and coordination of care.  Follow-Up Instructions: No follow-ups on file.   Collier Salina, MD  Note - This record has been created using [employer].  Chart creation errors have been sought, but may not always  have been located. Such creation errors do not reflect on  the standard of medical care.

## 2022-01-20 ENCOUNTER — Ambulatory Visit: Payer: 59 | Attending: Internal Medicine | Admitting: Internal Medicine

## 2022-01-27 ENCOUNTER — Telehealth: Payer: Self-pay

## 2022-01-27 ENCOUNTER — Telehealth: Payer: Self-pay | Admitting: *Deleted

## 2022-01-27 NOTE — Telephone Encounter (Signed)
Left message for pt daughter ot call office back regarding appt w/ Dr. Ilda Basset

## 2022-01-27 NOTE — Telephone Encounter (Signed)
Pts daughter made aware of results and that Dr Ilda Basset will discuss next steps at her follow up appt on 02/10/22

## 2022-01-27 NOTE — Telephone Encounter (Signed)
-----  Message from Aletha Halim, MD sent at 01/26/2022  8:38 PM EST ----- Can you let her know that some of the abnormal cells are still left behind and we will talk about next steps at her follow up appointment?  Also, I don't see a one month follow up for her. Edwin Dada, can you make a one month f/u for her, in person with me? thanks

## 2022-02-06 DIAGNOSIS — Z419 Encounter for procedure for purposes other than remedying health state, unspecified: Secondary | ICD-10-CM | POA: Diagnosis not present

## 2022-02-10 ENCOUNTER — Ambulatory Visit: Payer: 59 | Admitting: Obstetrics and Gynecology

## 2022-02-12 ENCOUNTER — Ambulatory Visit (INDEPENDENT_AMBULATORY_CARE_PROVIDER_SITE_OTHER): Payer: Medicaid Other | Admitting: Obstetrics and Gynecology

## 2022-02-12 ENCOUNTER — Encounter: Payer: Self-pay | Admitting: Obstetrics and Gynecology

## 2022-02-12 VITALS — BP 200/100 | HR 59 | Wt 127.8 lb

## 2022-02-12 DIAGNOSIS — Z9889 Other specified postprocedural states: Secondary | ICD-10-CM | POA: Diagnosis not present

## 2022-02-12 DIAGNOSIS — I160 Hypertensive urgency: Secondary | ICD-10-CM | POA: Diagnosis not present

## 2022-02-12 NOTE — Progress Notes (Signed)
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  Patient has had substernal chest pain for past 3 days and hasn't been taking her BP medicines because she ran out.   Review of Systems:  as noted in the History of Present Illness.   Patient Active Problem List   Diagnosis Date Noted   H/O LEEP 02/12/2022   Healthcare maintenance 09/02/2021   Hypothyroidism 07/23/2021   Prediabetes 07/23/2021   Pedal edema 04/17/2020   Chronic pain of left knee 05/16/2019   HIV (human immunodeficiency virus infection) ([place]) 01/19/2019   AIDS (acquired immune deficiency syndrome) ([place]) 11/30/2017   CVA (cerebral vascular accident) ([place]) 11/25/2017   Stroke ([place]) 12/08/2014   Left-sided weakness    HLD (hyperlipidemia)    Essential hypertension    Cerebrovascular accident (CVA) due to occlusion of cerebral artery (HCC)    Medications:  Ladonya Frech had no medications administered during this visit. Current Outpatient Medications  Medication Sig Dispense Refill   amLODipine (NORVASC) 10 MG tablet TAKE 1 TABLET(10 MG) BY MOUTH DAILY 30 tablet 3   aspirin EC 81 MG tablet Take 1 tablet (81 mg total) by mouth daily. Swallow whole. 90 tablet 3   bictegravir-emtricitabine-tenofovir AF (BIKTARVY) 50-200-25 MG TABS tablet Take 1 tablet by mouth daily. 30 tablet 11   hydrochlorothiazide (HYDRODIURIL) 25 MG tablet Take 1 tablet (25 mg total) by mouth daily. 30 tablet 2   levothyroxine (SYNTHROID) 25 MCG tablet Take 1 tablet (25 mcg total) by mouth daily. 30 tablet 2   rosuvastatin (CRESTOR) 40 MG tablet Take 1 tablet (40 mg total) by mouth daily. 90 tablet 3   valACYclovir (VALTREX) 1000 MG tablet Take 1 tablet (1,000 mg total) by mouth 3 (three) times daily.  21 tablet 0   valsartan-hydrochlorothiazide (DIOVAN-HCT) 80-12.5 MG tablet Take 1 tablet by mouth daily. 90 tablet 3   sulfamethoxazole-trimethoprim (BACTRIM) 400-80 MG tablet Take 1 tablet by mouth daily. (Patient not taking: Reported on 02/12/2022) 30 tablet 5   No current facility-administered medications for this visit.    Allergies: has No Known Allergies.  Physical Exam:  BP (!) 200/100 Comment: Left arm, manual  Pulse (!) 59   Wt 127 lb 12.8 oz (58 kg)   BMI 23.37 kg/m  Body mass index is 23.37 kg/m. General appearance: Well nourished, well developed female in no acute distress.  Neuro/Psych:  Normal mood and affect.    Assessment: patient stable  Plan:  1. Hypertensive urgency Recommend to her daughter to go to ED for evaluation which they are amenable to  2. H/O LEEP F/u visit sometime in the next few weeks   Aletha Halim, Brooke Bonito MD Attending Center for [employer] [place])

## 2022-02-12 NOTE — Progress Notes (Signed)
CC: Follow up results from [facility] 1.4.24  Has been having chest pain x 3 days Denies any SOB, blurred or double vision  does note pain midsternum  Daughter stating that she has not had her BP medication today.

## 2022-03-07 DIAGNOSIS — Z419 Encounter for procedure for purposes other than remedying health state, unspecified: Secondary | ICD-10-CM | POA: Diagnosis not present

## 2022-03-17 ENCOUNTER — Other Ambulatory Visit (HOSPITAL_COMMUNITY): Payer: Self-pay

## 2022-03-17 ENCOUNTER — Encounter: Payer: Self-pay | Admitting: Infectious Diseases

## 2022-03-17 ENCOUNTER — Other Ambulatory Visit: Payer: Self-pay

## 2022-03-17 ENCOUNTER — Ambulatory Visit (INDEPENDENT_AMBULATORY_CARE_PROVIDER_SITE_OTHER): Payer: Medicaid Other | Admitting: Infectious Diseases

## 2022-03-17 VITALS — BP 195/84 | HR 87 | Temp 97.3°F | Ht 61.0 in | Wt 134.0 lb

## 2022-03-17 DIAGNOSIS — E039 Hypothyroidism, unspecified: Secondary | ICD-10-CM | POA: Diagnosis not present

## 2022-03-17 DIAGNOSIS — Z79899 Other long term (current) drug therapy: Secondary | ICD-10-CM | POA: Diagnosis not present

## 2022-03-17 DIAGNOSIS — Z9889 Other specified postprocedural states: Secondary | ICD-10-CM

## 2022-03-17 DIAGNOSIS — Z0000 Encounter for general adult medical examination without abnormal findings: Secondary | ICD-10-CM

## 2022-03-17 DIAGNOSIS — B20 Human immunodeficiency virus [HIV] disease: Secondary | ICD-10-CM

## 2022-03-17 MED ORDER — LEVOTHYROXINE SODIUM 25 MCG PO TABS: 25.0000 ug | ORAL_TABLET | Freq: Every day | ORAL | 5 refills | Status: DC

## 2022-03-17 NOTE — Assessment & Plan Note (Signed)
I have refilled her Bactrim and asked her to continue this until her next office visit.  No findings concerning for opportunistic infection on exam today.  She has some vision concerns that do not include missing or spotty vision.  Will need to work on getting her into see ophthalmology.

## 2022-03-17 NOTE — Patient Instructions (Addendum)
Call the [facility] to request automatic refills of all the medications you take:   Biktarvy  Bactrim (preventative antibiotic) Synthroid (take in the morning first thing 30 minutes before breakfast) Crestor (cholesterol medicine) Valsartan-HCTZ 80-12.5 mg (blood pressure medicine)    Please come back to see [place] again in 3 months with our pharmacy team - please bring all of your medication

## 2022-03-17 NOTE — Assessment & Plan Note (Signed)
She will need follow-up with GYN for repeat Pap smear in December

## 2022-03-17 NOTE — Assessment & Plan Note (Signed)
See specifics discussed in HIV problem based charting

## 2022-03-17 NOTE — Assessment & Plan Note (Signed)
She is supposed to be on Synthroid 25 mcg once daily before meals.  We went over this again today and I refilled prescription to the correct pharmacy where her other medicines are.

## 2022-03-17 NOTE — Assessment & Plan Note (Signed)
Will have her back with our pharmacy team in 3 months in an attempt to get her medications blister packaged at [facility] long.  Her daughter [name] is primary caretaker and typically joins her for the visits.  She is out of the country in [place] for [place] months.  I did try to reach her today but phone went straight to voicemail.

## 2022-03-17 NOTE — Assessment & Plan Note (Addendum)
Last viral load was about 300.  Will repeat this today.  She has her Biktarvy with her.  Largely this has been challenging for her to continue her medication routinely due to pharmacy issues and lapses in her fills.  She now has Medicaid available for her.  I attempted to reach Sandra [name] on the phone today but it went straight to voicemail.  I think she would be a great candidate for blister packaging through the [place].  I will have her back to see our pharmacy team in 3 months to discuss this further.  Will have them bring all of her medications with her to this appointment. Viral load and CD4 to be checked today.  Vaccines up-to-date with the exception of Shingrix.  Will need follow-up Pap smear in December of this year with GYN. She has not had colon cancer screening yet Last mammogram was done in 2019.  Does not need endorectal cancer screening. She is on atorvastatin once daily which was recently increased to 21 from cardiology for secondary prevention following stroke.

## 2022-03-17 NOTE — Progress Notes (Signed)
Name: Sandra [name]  DOB: February 13, 1955 MRN: [id number] PCP: Camillia Herter, NP    Subjective:   CC: Routine HIV follow up care.   Here with her daughter.    HPI: Sandra [name] is a 67 y.o. female with HIV, stage 3 at diagnosis (11-2017 during hospitalization for acute stroke) with CD4 nadir 50 on once daily Biktarvy prescribed for management.  She has her Biktarvy with her today but not her Bactrim.  Her granddaughter is with her who does not normally accompany her nor help with her care.  Her daughter Sandra [name] is out of the country right now in [place].   CIN3 now S/P LEEP with GYN. Needs follow up with Dr. Ilda Basset team.  She is doing well and has no concerns over her procedure today.    BP uncontrolled - has been out of her BP medications x 3d now.  Recently saw cardiology and they switched her to valsartan-HCTZ 80-12.5 mg daily.  Amlodipine was stopped.  She is uncertain about her thyroid medication.   She describes her vision to be reddish tinted - denies any missing vision spots. Has been this way for 1 week now.     Review of Systems  Constitutional:  Negative for chills, fever, malaise/fatigue and weight loss.  HENT:  Negative for sore throat.   Respiratory:  Negative for cough, sputum production and shortness of breath.   Cardiovascular: Negative.   Gastrointestinal:  Negative for abdominal pain, diarrhea and vomiting.  Musculoskeletal:  Negative for joint pain, myalgias and neck pain.  Skin:  Negative for rash.  Neurological:  Negative for tingling, focal weakness and headaches.  Psychiatric/Behavioral:  Negative for depression and substance abuse. The patient is not nervous/anxious.      Past Medical History:  Diagnosis Date   Arthritis    Headache    HIV (human immunodeficiency virus infection) ([place]) 01/19/2019   Hypertension    Hypothyroidism 07/23/2021   Prediabetes 07/23/2021   Stroke [facility]     Outpatient Medications Prior to Visit  Medication Sig Dispense  Refill   aspirin EC 81 MG tablet Take 1 tablet (81 mg total) by mouth daily. Swallow whole. 90 tablet 3   bictegravir-emtricitabine-tenofovir AF (BIKTARVY) 50-200-25 MG TABS tablet Take 1 tablet by mouth daily. 30 tablet 11   rosuvastatin (CRESTOR) 40 MG tablet Take 1 tablet (40 mg total) by mouth daily. 90 tablet 3   sulfamethoxazole-trimethoprim (BACTRIM) 400-80 MG tablet Take 1 tablet by mouth daily. 30 tablet 5   valsartan-hydrochlorothiazide (DIOVAN-HCT) 80-12.5 MG tablet Take 1 tablet by mouth daily. 90 tablet 3   valACYclovir (VALTREX) 1000 MG tablet Take 1 tablet (1,000 mg total) by mouth 3 (three) times daily. 21 tablet 0   amLODipine (NORVASC) 10 MG tablet TAKE 1 TABLET(10 MG) BY MOUTH DAILY (Patient not taking: Reported on 03/17/2022) 30 tablet 3   hydrochlorothiazide (HYDRODIURIL) 25 MG tablet Take 1 tablet (25 mg total) by mouth daily. 30 tablet 2   levothyroxine (SYNTHROID) 25 MCG tablet Take 1 tablet (25 mcg total) by mouth daily. 30 tablet 2   No facility-administered medications prior to visit.     No Known Allergies   Social History   Tobacco Use   Smoking status: Never    Passive exposure: Never   Smokeless tobacco: Never  Vaping Use   Vaping Use: Never used  Substance Use Topics   Alcohol use: Never   Drug use: Never     Social History   Substance and Sexual  Activity  Sexual Activity Not Currently   Comment: declined condoms     Objective:   Vitals:   03/17/22 0822 03/17/22 0913  BP: (!) 204/85 (!) 195/84  Pulse: 87   Temp: (!) 97.3 F (36.3 C)   TempSrc: Temporal   Weight: 134 lb (60.8 kg)   Height: '5\' 1"'$  (1.549 m)     Body mass index is 25.32 kg/m.   Physical Exam HENT:     Mouth/Throat:     Mouth: No oral lesions.     Dentition: No dental abscesses.  Eyes:     Comments: Left eye is injected and intercanthus.  No pain with any EOM  Cardiovascular:     Rate and Rhythm: Normal rate and regular rhythm.     Heart sounds: Normal heart  sounds.  Pulmonary:     Effort: Pulmonary effort is normal.     Breath sounds: Normal breath sounds.  Abdominal:     General: There is no distension.     Palpations: Abdomen is soft.     Tenderness: There is no abdominal tenderness.  Musculoskeletal:        General: No tenderness. Normal range of motion.  Lymphadenopathy:     Cervical: No cervical adenopathy.  Skin:    General: Skin is warm and dry.     Findings: No rash.  Neurological:     Mental Status: She is alert and oriented to person, place, and time.  Psychiatric:        Judgment: Judgment normal.     Lab Results Lab Results  Component Value Date   WBC 6.0 07/22/2021   HGB 13.3 07/22/2021   HCT 38.0 07/22/2021   MCV 92 07/22/2021   PLT 221 07/22/2021    Lab Results  Component Value Date   CREATININE 0.99 12/06/2021   BUN 11 12/06/2021   NA 139 12/06/2021   K 4.8 12/06/2021   CL 100 12/06/2021   CO2 23 12/06/2021    Lab Results  Component Value Date   ALT 26 07/22/2021   AST 20 07/22/2021   ALKPHOS 46 07/22/2021   BILITOT 0.3 07/22/2021    Lab Results  Component Value Date   CHOL 224 (H) 07/22/2021   HDL 95 07/22/2021   LDLCALC 117 (H) 07/22/2021   TRIG 68 07/22/2021   CHOLHDL 2.4 07/22/2021   HIV 1 RNA Quant  Date Value  12/19/2021 309 Copies/mL (H)  10/03/2021 42 Copies/mL (H)  09/02/2021 137,000 copies/mL (H)   CD4 T Cell Abs (/uL)  Date Value  12/19/2021 182 (L)  09/02/2021 110 (L)  04/17/2021 176 (L)     Assessment & Plan:   Patient Active Problem List   Diagnosis Date Noted   HIV (human immunodeficiency virus infection) ([place]) 01/19/2019   AIDS (acquired immune deficiency syndrome) ([place]) 11/30/2017   Polypharmacy 03/17/2022   H/O LEEP 02/12/2022   Healthcare maintenance 09/02/2021   Hypothyroidism 07/23/2021   Prediabetes 07/23/2021   Pedal edema 04/17/2020   Chronic pain of left knee 05/16/2019   CVA (cerebral vascular accident) ([place]) 11/25/2017   Stroke ([place]) 12/08/2014    Left-sided weakness    HLD (hyperlipidemia)    Essential hypertension    Cerebrovascular accident (CVA) due to occlusion of cerebral artery ([place])     Problem List Items Addressed This Visit       High   AIDS (acquired immune deficiency syndrome) ([place]) - Primary    I have refilled her Bactrim and asked her to  continue this until her next office visit.  No findings concerning for opportunistic infection on exam today.  She has some vision concerns that do not include missing or spotty vision.  Will need to work on getting her into see ophthalmology.      Relevant Orders   HIV 1 RNA quant-no reflex-bld   T-helper cells (CD4) count   Comp Met (CMET)   HIV (human immunodeficiency virus infection) ([place]) (Chronic)    Last viral load was about 300.  Will repeat this today.  She has her Biktarvy with her.  Largely this has been challenging for her to continue her medication routinely due to pharmacy issues and lapses in her fills.  She now has Medicaid available for her.  I attempted to reach [place] on the phone today but it went straight to voicemail.  I think she would be a great candidate for blister packaging through the [place].  I will have her back to see our pharmacy team in 3 months to discuss this further.  Will have them bring all of her medications with her to this appointment. Viral load and CD4 to be checked today.  Vaccines up-to-date with the exception of Shingrix.  Will need follow-up Pap smear in December of this year with GYN. She has not had colon cancer screening yet Last mammogram was done in 2019.  Does not need endorectal cancer screening. She is on atorvastatin once daily which was recently increased to 37 from cardiology for secondary prevention following stroke.        Unprioritized   Hypothyroidism    She is supposed to be on Synthroid 25 mcg once daily before meals.  We went over this again today and I refilled prescription to the correct  pharmacy where her other medicines are.       Relevant Medications   levothyroxine (SYNTHROID) 25 MCG tablet   Healthcare maintenance    See specifics discussed in HIV problem based charting      H/O LEEP    She will need follow-up with GYN for repeat Pap smear in December      Polypharmacy    Will have her back with our pharmacy team in 3 months in an attempt to get her medications blister packaged at [facility].  Her daughter Sandra [name] is primary caretaker and typically joins her for the visits.  She is out of the country in [place] for several months.  I did try to reach her today but phone went straight to voicemail.     Return in about 3 months (around 06/17/2022).   Total encounter time: 30 minutes including direct face-to-face discussion/interpretation, review of medication she brought with her today, review of multiple care provider notes since her last office visit  [name], MSN, NP-C [facility] for [place] Pager: [phone] Office: [phone]  03/17/22  11:06 AM

## 2022-03-18 LAB — T-HELPER CELLS (CD4) COUNT (NOT AT ARMC)
CD4 % Helper T Cell: 8 % — ABNORMAL LOW (ref 33–65)
CD4 T Cell Abs: 133 /uL — ABNORMAL LOW (ref 400–1790)

## 2022-03-20 LAB — COMPREHENSIVE METABOLIC PANEL
AG Ratio: 1.4 (calc) (ref 1.0–2.5)
ALT: 16 U/L (ref 6–29)
AST: 22 U/L (ref 10–35)
Albumin: 4.7 g/dL (ref 3.6–5.1)
Alkaline phosphatase (APISO): 50 U/L (ref 37–153)
BUN: 10 mg/dL (ref 7–25)
CO2: 26 mmol/L (ref 20–32)
Calcium: 9.5 mg/dL (ref 8.6–10.4)
Chloride: 104 mmol/L (ref 98–110)
Creat: 0.99 mg/dL (ref 0.50–1.05)
Globulin: 3.3 g/dL (calc) (ref 1.9–3.7)
Glucose, Bld: 111 mg/dL — ABNORMAL HIGH (ref 65–99)
Potassium: 3.8 mmol/L (ref 3.5–5.3)
Sodium: 137 mmol/L (ref 135–146)
Total Bilirubin: 1.2 mg/dL (ref 0.2–1.2)
Total Protein: 8 g/dL (ref 6.1–8.1)

## 2022-03-20 LAB — HIV-1 RNA QUANT-NO REFLEX-BLD
HIV 1 RNA Quant: <20 Copies/mL — ABNORMAL HIGH
HIV-1 RNA Quant, Log: <1.3 Log cps/mL — ABNORMAL HIGH

## 2022-03-24 ENCOUNTER — Telehealth: Payer: Self-pay

## 2022-03-24 NOTE — Telephone Encounter (Signed)
Staff attemtped to contact patient's daughter today to relay lab results. Her voicemail box if full at this time. Will attempt to reach again by the end of the day.   Eugenia Mcalpine, LPN

## 2022-03-24 NOTE — Telephone Encounter (Signed)
Spoke w/ Vanita Ingles. Left message for [name] (daughter) to call back and schedule pt for f/u leep w/ Dr. Ilda Basset

## 2022-03-24 NOTE — Telephone Encounter (Signed)
-----   Message from Breckenridge Callas, NP sent at 03/21/2022  1:29 PM EDT ----- Please give her daughter [name] called to let her know that Michelena's viral load is undetectable, great job getting in the medication every day.  Keep it up.  Her CD4 count about the same and will need more time to recover.  Recently it was 133.  Please make sure she continues taking her Bactrim for prevention every day as well.  I sent a new prescription recently.

## 2022-03-25 NOTE — Telephone Encounter (Signed)
Called patient, no answer. Left HIPAA compliant voicemail requesting callback.   Called patient's daughter, [name], no answer.   Beryle Flock, RN

## 2022-03-25 NOTE — Progress Notes (Deleted)
Patient ID: Sandra [name]                 DOB: 14-May-1955                      MRN: [id number]      HPI: Sandra [name] is a 67 y.o. female referred by Dr. Phineas Inches to HTN clinic. PMH is significant for HTN, CVA 12/08/2014- residual L sided weakness, preDM, HLD, HIV/AIDS, hypothyroid   Patient saw Dr. Harl Bowie on 12/25/2021, Lisinopril was switched to Diovan HCTZ 80/12.5 mg and simvastatin 40 mg was changed to rosuvastatin 40 mg. BMP and 3 months lipid lab are due. On 02/07 patient was sent to ED  for hypertensive emergency by her OBGYN. Patient reported she did not take her BP medication for few days because she ran out of them.   Current HTN meds: amlodipine 10 mg, Diovan HCTZ 80/12.5 mg daily.  Previously tried: lisinopril  BP goal: <130/80  Family History:   Social History:   Diet:   Exercise:  {types:28256}  Home BP readings:  Date SBP/DBP  HR              Average      Wt Readings from Last 3 Encounters:  03/17/22 134 lb (60.8 kg)  02/12/22 127 lb 12.8 oz (58 kg)  01/09/22 133 lb (60.3 kg)   BP Readings from Last 3 Encounters:  03/17/22 (!) 195/84  02/12/22 (!) 200/100  01/09/22 (!) 166/83   Pulse Readings from Last 3 Encounters:  03/17/22 87  02/12/22 (!) 59  01/09/22 76    Renal function: Estimated Creatinine Clearance: 46.1 mL/min (by C-G formula based on SCr of 0.99 mg/dL).  Past Medical History:  Diagnosis Date   Arthritis    Headache    HIV (human immunodeficiency virus infection) ([place]) 01/19/2019   Hypertension    Hypothyroidism 07/23/2021   Prediabetes 07/23/2021   Stroke [facility])     Current Outpatient Medications on File Prior to Visit  Medication Sig Dispense Refill   aspirin EC 81 MG tablet Take 1 tablet (81 mg total) by mouth daily. Swallow whole. 90 tablet 3   bictegravir-emtricitabine-tenofovir AF (BIKTARVY) 50-200-25 MG TABS tablet Take 1 tablet by mouth daily. 30 tablet 11   levothyroxine (SYNTHROID) 25 MCG tablet Take 1 tablet (25  mcg total) by mouth daily. 30 tablet 5   rosuvastatin (CRESTOR) 40 MG tablet Take 1 tablet (40 mg total) by mouth daily. 90 tablet 3   sulfamethoxazole-trimethoprim (BACTRIM) 400-80 MG tablet Take 1 tablet by mouth daily. 30 tablet 5   valsartan-hydrochlorothiazide (DIOVAN-HCT) 80-12.5 MG tablet Take 1 tablet by mouth daily. 90 tablet 3   No current facility-administered medications on file prior to visit.    No Known Allergies  There were no vitals taken for this visit.   Assessment/Plan:  1. Hypertension -  No problem-specific Assessment & Plan notes found for this encounter.      Thank you  Cammy Copa, Pharm.D [facility] A Division of [facility] [address] [place], [place] [address]  Phone: [phone]; Fax: [phone]

## 2022-03-26 ENCOUNTER — Ambulatory Visit: Payer: Medicaid Other

## 2022-03-27 NOTE — Telephone Encounter (Signed)
Called patient daughter - VM box full.   Called mobile number in chart. Spoke to patient granddaughter (regina daughter)  - [name] is currently out of town. Will attempt to try again later.   [place], CMA

## 2022-03-31 NOTE — Telephone Encounter (Signed)
Spoke with patient's daughter, [name]. Relayed that Halsey's viral load is undetectable and that CD4 is about the same. Encouraged continued daily adherence to Bactrim as CD4 may need some time to recover. [name] verbalized understanding and has no further questions.   Beryle Flock, RN

## 2022-04-07 DIAGNOSIS — Z419 Encounter for procedure for purposes other than remedying health state, unspecified: Secondary | ICD-10-CM | POA: Diagnosis not present

## 2022-04-24 ENCOUNTER — Ambulatory Visit: Payer: Medicaid Other | Attending: Cardiology | Admitting: Student

## 2022-04-24 ENCOUNTER — Encounter: Payer: Self-pay | Admitting: Student

## 2022-04-24 VITALS — BP 180/89 | HR 65

## 2022-04-24 DIAGNOSIS — I10 Essential (primary) hypertension: Secondary | ICD-10-CM | POA: Diagnosis not present

## 2022-04-24 MED ORDER — AMLODIPINE BESYLATE 5 MG PO TABS: 5.0000 mg | ORAL_TABLET | Freq: Every day | ORAL | 3 refills | Status: DC

## 2022-04-24 NOTE — Assessment & Plan Note (Signed)
Assessment: BP is uncontrolled in office BP 200/92 upon repeating measurement it came down to 180 89 with heart rate 65 (goal <130/80). Takes valsartan -HCTZ 80/12.5 regularly and tolerates it well without any side effects Denies SOB, palpitation, chest pain, headaches,or swelling Reiterated the importance of regular exercise and low salt diet   Plan:  Start taking Norvasc 5 mg daily  Continue taking Diovan HCTZ 80/12.5 mg daily  Patient to keep record of BP readings with heart rate and report to [place] at the next visit  Patient to bring home BP for validation at the next OV Patient to see PharmD in 5 weeks for follow up  Follow up lab(s) : none

## 2022-04-24 NOTE — Patient Instructions (Addendum)
Changes made by your pharmacist Carmela Hurt, PharmD at today's visit:    Instructions/Changes  (what do you need to do) Your Notes  (what you did and when you did it)  Start taking Norvasc 5 mg daily    2.continue taking valsartan 80 mg-HCTz 12.5 mg daily   3.cut down on salt intake     Bring all of your meds, your BP cuff and your record of home blood pressures to your next appointment.    HOW TO TAKE YOUR BLOOD PRESSURE AT HOME  Rest 5 minutes before taking your blood pressure.  Don't smoke or drink caffeinated beverages for at least 30 minutes before. Take your blood pressure before (not after) you eat. Sit comfortably with your back supported and both feet on the floor (don't cross your legs). Elevate your arm to heart level on a table or a desk. Use the proper sized cuff. It should fit smoothly and snugly around your bare upper arm. There should be enough room to slip a fingertip under the cuff. The bottom edge of the cuff should be 1 inch above the crease of the elbow. Ideally, take 3 measurements at one sitting and record the average.  Important lifestyle changes to control high blood pressure  Intervention  Effect on the BP  Lose extra pounds and watch your waistline Weight loss is one of the most effective lifestyle changes for controlling blood pressure. If you're overweight or obese, losing even a small amount of weight can help reduce blood pressure. Blood pressure might go down by about 1 millimeter of mercury (mm Hg) with each kilogram (about 2.2 pounds) of weight lost.  Exercise regularly As a general goal, aim for at least 30 minutes of moderate physical activity every day. Regular physical activity can lower high blood pressure by about 5 to 8 mm Hg.  Eat a healthy diet Eating a diet rich in whole grains, fruits, vegetables, and low-fat dairy products and low in saturated fat and cholesterol. A healthy diet can lower high blood pressure by up to 11 mm Hg.  Reduce  salt (sodium) in your diet Even a small reduction of sodium in the diet can improve heart health and reduce high blood pressure by about 5 to 6 mm Hg.  Limit alcohol One drink equals 12 ounces of beer, 5 ounces of wine, or 1.5 ounces of 80-proof liquor.  Limiting alcohol to less than one drink a day for women or two drinks a day for men can help lower blood pressure by about 4 mm Hg.   If you have any questions or concerns please use My Chart to send questions or call the office at [phone]

## 2022-04-24 NOTE — Progress Notes (Signed)
Patient ID: Sandra [name]                 DOB: [birth year]                      MRN: [id number]      HPI: Sandra [name] is a 67 y.o. female referred by Dr. Wyline Mood to HTN clinic. 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 At the last visit with Dr.Branch  lisinopril was changed to valsartan-HCTZ and amlodipine was stopped. Crestor 40 mg and ASA was started for secondary prevention.  Patient presented with her daughter for hypertension follow up. Reports she checks her BP at home but forgot to bring BP monitor or log. Her BP always high at the doctors office but at home it is lower she was unable to tell numbers for home BP readings. She feels good. Takes and tolerates all her medications well without side effects. She has cut down on salt intake but still she add salt on some of her cooked food. She does 10 min of stretches and walking every other day.  Current HTN meds: valsartan 80 mg-HCTz 12.5 mg daily Previously tried: Norvasc 10 mg  BP goal: <130/80   Family History: mother, sister and younger brother, father- stroke   Social History:  Alcohol: none  Smoking: never   Diet: reduced salt intake still room to improve   Exercise: 10 min Q2D (5 min stretches and 5 min walking) motivated to increase time to 20 min every other day  in next 4 weeks   Home BP readings: don't remember but reports its good    Wt Readings from Last 3 Encounters:  03/17/22 134 lb (60.8 kg)  02/12/22 127 lb 12.8 oz (58 kg)  01/09/22 133 lb (60.3 kg)   BP Readings from Last 3 Encounters:  04/24/22 (!) 180/89  03/17/22 (!) 195/84  02/12/22 (!) 200/100   Pulse Readings from Last 3 Encounters:  04/24/22 65  03/17/22 87  02/12/22 (!) 59    Renal function: CrCl cannot be calculated (Patient's most recent lab result is older than the maximum 21 days allowed.).  Past Medical History:  Diagnosis Date   Arthritis    Headache    HIV  (human immunodeficiency virus infection) (HCC) 01/19/2019   Hypertension    Hypothyroidism 07/23/2021   Prediabetes 07/23/2021   Stroke [facility])     Current Outpatient Medications on File Prior to Visit  Medication Sig Dispense Refill   aspirin EC 81 MG tablet Take 1 tablet (81 mg total) by mouth daily. Swallow whole. 90 tablet 3   bictegravir-emtricitabine-tenofovir AF (BIKTARVY) 50-200-25 MG TABS tablet Take 1 tablet by mouth daily. 30 tablet 11   levothyroxine (SYNTHROID) 25 MCG tablet Take 1 tablet (25 mcg total) by mouth daily. 30 tablet 5   rosuvastatin (CRESTOR) 40 MG tablet Take 1 tablet (40 mg total) by mouth daily. 90 tablet 3   sulfamethoxazole-trimethoprim (BACTRIM) 400-80 MG tablet Take 1 tablet by mouth daily. 30 tablet 5   valsartan-hydrochlorothiazide (DIOVAN-HCT) 80-12.5 MG tablet Take 1 tablet by mouth daily. 90 tablet 3   No current facility-administered medications on file prior to visit.    No Known Allergies  Blood pressure (!) 180/89, pulse 65, SpO2 100 %.   Essential hypertension Assessment: BP is uncontrolled in office BP 200/92 upon repeating measurement it came down to 180 89 with heart rate 65 (goal <130/80). Takes valsartan -HCTZ 80/12.5  regularly and tolerates it well without any side effects Denies SOB, palpitation, chest pain, headaches,or swelling Reiterated the importance of regular exercise and low salt diet   Plan:  Start taking Norvasc 5 mg daily  Continue taking Diovan HCTZ 80/12.5 mg daily  Patient to keep record of BP readings with heart rate and report to [place] at the next visit  Patient to bring home BP for validation at the next OV Patient to see PharmD in 5 weeks for follow up  Follow up lab(s) : none       Thank you  Sandra [name], Pharm.D [facility] A Division of [facility] [address], [place], [place] [address]  Phone: [phone]; Fax: [phone]

## 2022-05-07 DIAGNOSIS — Z419 Encounter for procedure for purposes other than remedying health state, unspecified: Secondary | ICD-10-CM | POA: Diagnosis not present

## 2022-05-09 ENCOUNTER — Telehealth: Payer: Self-pay

## 2022-05-09 NOTE — Telephone Encounter (Signed)
Patient called office to pick up paper work dropped off last week. States she needs this completed before Tuesday 5/7 for appointment in [place], [place].  Provider does have paper work. Will try to have this done Monday morning for patient to pick up. Understands office will call her when completed to pick up. Juanita Laster, RMA

## 2022-05-12 NOTE — Telephone Encounter (Signed)
Called patient's daughter to inform her provider has filled out paper work. Paper work is [employer] representative ID number and signature. Will bring copy of ID today.  Will have patient sign paper work before making copy for scanning.  Juanita Laster, RMA

## 2022-05-29 ENCOUNTER — Ambulatory Visit: Payer: Medicaid Other | Attending: Family

## 2022-05-29 NOTE — Progress Notes (Deleted)
Patient ID: Sandra [name]                 DOB: 08-Jun-1955                      MRN: [id number]      HPI: Ryeisha Cespedes is a 67 y.o. female referred by Dr. Wyline Mood to HTN clinic. 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 At the last visit with Dr.Branch  lisinopril was changed to valsartan-HCTZ and amlodipine was stopped. Crestor 40 mg and ASA was started for secondary prevention.  At last visit with PharmD, amlodipine 5mg  was resumed.  Home readings? Dizziness, lightheadedness, headache, blurred vision, SOB, swelling Compliance?   Patient presented with her daughter for hypertension follow up. Reports she checks her BP at home but forgot to bring BP monitor or log. Her BP always high at the doctors office but at home it is lower she was unable to tell numbers for home BP readings. She feels good. Takes and tolerates all her medications well without side effects. She has cut down on salt intake but still she add salt on some of her cooked food. She does 10 min of stretches and walking every other day.   Current HTN meds: valsartan 80 mg-HCTz 12.5 mg daily, amlodipine 5mg  daily Previously tried: Norvasc 10 mg  BP goal: <130/80   Family History: mother, sister and younger brother, father- stroke   Social History:  Alcohol: none  Smoking: never   Diet: reduced salt intake still room to improve   Exercise: 10 min Q2D (5 min stretches and 5 min walking) motivated to increase time to 20 min every other day  in next 4 weeks   Home BP readings: don't remember but reports its good    Wt Readings from Last 3 Encounters:  03/17/22 134 lb (60.8 kg)  02/12/22 127 lb 12.8 oz (58 kg)  01/09/22 133 lb (60.3 kg)   BP Readings from Last 3 Encounters:  04/24/22 (!) 180/89  03/17/22 (!) 195/84  02/12/22 (!) 200/100   Pulse Readings from Last 3 Encounters:  04/24/22 65  03/17/22 87  02/12/22 (!) 59    Renal  function: CrCl cannot be calculated (Patient's most recent lab result is older than the maximum 21 days allowed.).  Past Medical History:  Diagnosis Date   Arthritis    Headache    HIV (human immunodeficiency virus infection) (HCC) 01/19/2019   Hypertension    Hypothyroidism 07/23/2021   Prediabetes 07/23/2021   Stroke [facility])     Current Outpatient Medications on File Prior to Visit  Medication Sig Dispense Refill   amLODipine (NORVASC) 5 MG tablet Take 1 tablet (5 mg total) by mouth daily. 90 tablet 3   aspirin EC 81 MG tablet Take 1 tablet (81 mg total) by mouth daily. Swallow whole. 90 tablet 3   bictegravir-emtricitabine-tenofovir AF (BIKTARVY) 50-200-25 MG TABS tablet Take 1 tablet by mouth daily. 30 tablet 11   levothyroxine (SYNTHROID) 25 MCG tablet Take 1 tablet (25 mcg total) by mouth daily. 30 tablet 5   rosuvastatin (CRESTOR) 40 MG tablet Take 1 tablet (40 mg total) by mouth daily. 90 tablet 3   sulfamethoxazole-trimethoprim (BACTRIM) 400-80 MG tablet Take 1 tablet by mouth daily. 30 tablet 5   valsartan-hydrochlorothiazide (DIOVAN-HCT) 80-12.5 MG tablet Take 1 tablet by mouth daily. 90 tablet 3   No current facility-administered medications on file prior to visit.  No Known Allergies  There were no vitals taken for this visit.   No problem-specific Assessment & Plan notes found for this encounter.      Thank you  Carmela Hurt, Pharm.D [facility] Division of [facility] [address] [place], [place] [address]  Phone: [phone]; Fax: [phone]

## 2022-05-30 ENCOUNTER — Encounter: Payer: Self-pay | Admitting: Family

## 2022-06-07 DIAGNOSIS — Z419 Encounter for procedure for purposes other than remedying health state, unspecified: Secondary | ICD-10-CM | POA: Diagnosis not present

## 2022-06-23 NOTE — Progress Notes (Signed)
Cardiology Office Note:    Date:  06/23/2022   ID:  Wolfgang Phoenix, DOB 15-Mar-1955, MRN [id number]  PCP:  Rema Fendt, NP   [facility] Providers Cardiologist:  None     Referring MD: Rema Fendt, NP   No chief complaint on file. HTN  History of Present Illness:    Sandra [name] is a 67 y.o. 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 They are from [place]; lived in [place] for > 10 years. She has had HTN for the past 10 years. CVA thought 2/2 HTN. No hx of afib.    She takes her blood pressure medicine. At home she measures her pressures and notes around 140s systolics.   Interim hx 06/24/2022 She is doing well today. Can't sleep well. The Bp machine broke, she needs a new one. She denies CP or HA. No LE edema.  No PND or orthopnea.  Past Medical History:  Diagnosis Date   Arthritis    Headache    HIV (human immunodeficiency virus infection) (HCC) 01/19/2019   Hypertension    Hypothyroidism 07/23/2021   Prediabetes 07/23/2021   Stroke [facility])     Past Surgical History:  Procedure Laterality Date   COLPOSCOPY W/ BIOPSY / CURETTAGE  10/31/2021   ENDOMETRIAL BIOPSY  10/31/2021   EYE SURGERY     LEEP  01/09/2022    Current Medications: Current Outpatient Medications on File Prior to Visit  Medication Sig Dispense Refill   amLODipine (NORVASC) 5 MG tablet Take 1 tablet (5 mg total) by mouth daily. 90 tablet 3   aspirin EC 81 MG tablet Take 1 tablet (81 mg total) by mouth daily. Swallow whole. 90 tablet 3   bictegravir-emtricitabine-tenofovir AF (BIKTARVY) 50-200-25 MG TABS tablet Take 1 tablet by mouth daily. 30 tablet 11   levothyroxine (SYNTHROID) 25 MCG tablet Take 1 tablet (25 mcg total) by mouth daily. 30 tablet 5   sulfamethoxazole-trimethoprim (BACTRIM) 400-80 MG tablet Take 1 tablet by mouth daily. 30 tablet 5    valsartan-hydrochlorothiazide (DIOVAN-HCT) 80-12.5 MG tablet Take 1 tablet by mouth daily. 90 tablet 3   rosuvastatin (CRESTOR) 40 MG tablet Take 1 tablet (40 mg total) by mouth daily. 90 tablet 3   No current facility-administered medications on file prior to visit.    Allergies:   Patient has no known allergies.   Social History   Socioeconomic History   Marital status: Single    Spouse name: Not on file   Number of children: Not on file   Years of education: Not on file   Highest education level: Not on file  Occupational History   Not on file  Tobacco Use   Smoking status: Never    Passive exposure: Never   Smokeless tobacco: Never  Vaping Use   Vaping Use: Never used  Substance and Sexual Activity   Alcohol use: Never   Drug use: Never   Sexual activity: Not Currently    Comment: declined condoms  Other Topics Concern   Not on file  Social History Narrative   ** Merged History Encounter **       Social Determinants of Health   Financial Resource Strain: Low Risk  (11/25/2017)   Overall Financial Resource Strain (CARDIA)    Difficulty of Paying Living Expenses: Not hard at all  Food Insecurity: No Food Insecurity (11/25/2017)  Hunger Vital Sign    Worried About Running Out of Food in the Last Year: Never true    Ran Out of Food in the Last Year: Never true  Transportation Needs: No Transportation Needs (11/25/2017)   PRAPARE - Administrator, Civil Service (Medical): No    Lack of Transportation (Non-Medical): No  Physical Activity: Unknown (11/25/2017)   Exercise Vital Sign    Days of Exercise per Week: Patient declined    Minutes of Exercise per Session: Patient declined  Stress: No Stress Concern Present (11/25/2017)   [employer] of Occupational Health - Occupational Stress Questionnaire    Feeling of Stress : Not at all  Social Connections: Unknown (11/25/2017)   Social Connection and Isolation Panel [NHANES]    Frequency of  Communication with Friends and Family: More than three times a week    Frequency of Social Gatherings with Friends and Family: More than three times a week    Attends Religious Services: More than 4 times per year    Active Member of [employer] or Organizations: No    Attends Banker Meetings: Never    Marital Status: Patient declined     Family History: The patient's family history includes Stroke in her mother and sister. There is no history of Colon cancer, Esophageal cancer, Rectal cancer, or Stomach cancer.  ROS:   Please see the history of present illness.     All other systems reviewed and are negative.  EKGs/Labs/Other Studies Reviewed:    The following studies were reviewed today:  Cardiology Studies: TTE 11/26/2017: EF 60-65%, negative bubble for PFO, no significant valve dx  Carotid Duplex BL: 1-39% stenosis BL  TTE 12/08/2014: EF is normal, no PFO  EKG:  EKG is  ordered today.  The ekg ordered today demonstrates   12/25/2021- NSR, septal Q  Recent Labs: 07/22/2021: Hemoglobin 13.3; Platelets 221 12/06/2021: TSH 1.620 03/17/2022: ALT 16; BUN 10; Creat 0.99; Potassium 3.8; Sodium 137   Recent Lipid Panel    Component Value Date/Time   CHOL 224 (H) 07/22/2021 1523   TRIG 68 07/22/2021 1523   HDL 95 07/22/2021 1523   CHOLHDL 2.4 07/22/2021 1523   CHOLHDL 5.6 (H) 02/11/2021 1149   VLDL 15 11/26/2017 0354   LDLCALC 117 (H) 07/22/2021 1523   LDLCALC 144 (H) 02/11/2021 1149     Risk Assessment/Calculations:     Physical Exam:    VS: Vitals:   06/24/22 0822  BP: (!) 148/96  Pulse: 72  SpO2: 98%     Wt Readings from Last 3 Encounters:  03/17/22 134 lb (60.8 kg)  02/12/22 127 lb 12.8 oz (58 kg)  01/09/22 133 lb (60.3 kg)     GEN:  Well nourished, well developed in no acute distress HEENT: Normal NECK: No JVD; No carotid bruits LYMPHATICS: No lymphadenopathy CARDIAC: RRR, no murmurs, rubs, gallops RESPIRATORY:  Clear to auscultation  without rales, wheezing or rhonchi  ABDOMEN: Soft, non-tender, non-distended MUSCULOSKELETAL:  No edema; No deformity  SKIN: Warm and dry NEUROLOGIC:  Alert and oriented x 3 PSYCHIATRIC:  Normal affect   ASSESSMENT:    HTN: not well controlled.  I recommended norvasc 10, unclear why stopped. Will increase  norvasc 5 mg daily to 10. Continue valsartan 80 mg-HCTz 12.5 mg daily. If not controlled can add spironolactone  CVA: HTN contributing. LDL goal < 70  mg/dl ,   Cont asa 81 mg daily. Crestor 40 mg daily  HIV: on Biktarvy. CD4  182  PLAN:    In order of problems listed above:  Norvasc 10 mg daily Fasting lipids Follow up 6 months            Medication Adjustments/Labs and Tests Ordered: Current medicines are reviewed at length with the patient today.  Concerns regarding medicines are outlined above.  No orders of the defined types were placed in this encounter.  No orders of the defined types were placed in this encounter.   There are no Patient Instructions on file for this visit.   Signed, Maisie Fus, MD  06/23/2022 10:51 AM    [facility]

## 2022-06-24 ENCOUNTER — Ambulatory Visit: Payer: Medicaid Other | Attending: Internal Medicine | Admitting: Internal Medicine

## 2022-06-24 ENCOUNTER — Encounter: Payer: Self-pay | Admitting: Internal Medicine

## 2022-06-24 VITALS — BP 148/96 | HR 72 | Ht 61.0 in | Wt 134.0 lb

## 2022-06-24 DIAGNOSIS — I63311 Cerebral infarction due to thrombosis of right middle cerebral artery: Secondary | ICD-10-CM

## 2022-06-24 LAB — LIPID PANEL
Chol/HDL Ratio: 5.4 ratio — ABNORMAL HIGH (ref 0.0–4.4)
Cholesterol, Total: 273 mg/dL — ABNORMAL HIGH (ref 100–199)
HDL: 51 mg/dL (ref >39)
LDL Chol Calc (NIH): 186 mg/dL — ABNORMAL HIGH (ref 0–99)
Triglycerides: 193 mg/dL — ABNORMAL HIGH (ref 0–149)
VLDL Cholesterol Cal: 36 mg/dL (ref 5–40)

## 2022-06-24 MED ORDER — AMLODIPINE BESYLATE 10 MG PO TABS: 10.0000 mg | ORAL_TABLET | Freq: Every day | ORAL | 3 refills | Status: DC

## 2022-06-24 NOTE — Patient Instructions (Signed)
Medication Instructions:  INCREASE NORVASC TO 10 MG DAILY *If you need a refill on your cardiac medications before your next appointment, please call your pharmacy*   Lab Work: FASTING LIPIDS TODAY If you have labs (blood work) drawn today and your tests are completely normal, you will receive your results only by: MyChart Message (if you have MyChart) OR A paper copy in the mail If you have any lab test that is abnormal or we need to change your treatment, we will call you to review the results.   Testing/Procedures: NONE   Follow-Up: At [facility], you and your health needs are our priority.  As part of our continuing mission to provide you with exceptional heart care, we have created designated Provider Care Teams.  These Care Teams include your primary Cardiologist (physician) and Advanced Practice Providers (APPs -  Physician Assistants and Nurse Practitioners) who all work together to provide you with the care you need, when you need it.  We recommend signing up for the patient portal called "MyChart".  Sign up information is provided on this After Visit Summary.  MyChart is used to connect with patients for Virtual Visits (Telemedicine).  Patients are able to view lab/test results, encounter notes, upcoming appointments, etc.  Non-urgent messages can be sent to your provider as well.   To learn more about what you can do with MyChart, go to [email or web address].    Your next appointment:   6 month(s)  Provider:   Dr. Carolan Clines

## 2022-06-26 ENCOUNTER — Ambulatory Visit: Payer: 59 | Admitting: Internal Medicine

## 2022-06-30 NOTE — Patient Instructions (Incomplete)
   Call Dr. Vergie Living at your ability for follow up from the LEEP procedure from January -  Address: [address] [place], [place] [address] Phone: [phone]

## 2022-06-30 NOTE — Progress Notes (Unsigned)
Name: Sandra [name]  DOB: March 20, 1955 MRN: [id number] PCP: Rema Fendt, NP    Subjective:   CC: Routine HIV follow up care.   Here with her daughter.    HPI: Sandra [name] is a 67 y.o. female with HIV, stage 3 at diagnosis (11-2017 during hospitalization for acute stroke) with CD4 nadir 50 on once daily Biktarvy prescribed for management.    LOV for HIV follow up in March revealed improved viologic control with VL < 20. Slow to recover immunologically with CD4's ranging 110-180 (4-8%) in the setting of frequent drug interruption.   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 LOV with Dr. Vergie Living 02/12/22 --> priority to HTN urgency with symptoms and recommended FU again soon.   Saw cardiology team 6/18 - increased amlodipine to 10 mg and continued valsartan - hydrochlorothiazide 80/12.5mg  daily. Considering spiro. She has not picked up the 10 mg tablet yet but plans to today.     Review of Systems  Constitutional:  Negative for chills, fever, malaise/fatigue and weight loss.  HENT:  Negative for sore throat.   Respiratory:  Negative for cough, sputum production and shortness of breath.   Cardiovascular: Negative.   Gastrointestinal:  Negative for abdominal pain, diarrhea and vomiting.  Musculoskeletal:  Negative for joint pain, myalgias and neck pain.  Skin:  Negative for rash.  Neurological:  Negative for tingling, focal weakness and headaches.  Psychiatric/Behavioral:  Negative for depression and substance abuse. The patient is not nervous/anxious.      Past Medical History:  Diagnosis Date   Arthritis    Headache    HIV (human immunodeficiency virus infection) (HCC) 01/19/2019   Hypertension    Hypothyroidism 07/23/2021   Prediabetes 07/23/2021   Stroke [facility])     Outpatient Medications Prior to Visit  Medication Sig Dispense Refill   amLODipine  (NORVASC) 10 MG tablet Take 1 tablet (10 mg total) by mouth daily. 90 tablet 3   aspirin EC 81 MG tablet Take 1 tablet (81 mg total) by mouth daily. Swallow whole. 90 tablet 3   bictegravir-emtricitabine-tenofovir AF (BIKTARVY) 50-200-25 MG TABS tablet Take 1 tablet by mouth daily. 30 tablet 11   levothyroxine (SYNTHROID) 25 MCG tablet Take 1 tablet (25 mcg total) by mouth daily. 30 tablet 5   valsartan-hydrochlorothiazide (DIOVAN-HCT) 80-12.5 MG tablet Take 1 tablet by mouth daily. 90 tablet 3   rosuvastatin (CRESTOR) 40 MG tablet Take 1 tablet (40 mg total) by mouth daily. 90 tablet 3   sulfamethoxazole-trimethoprim (BACTRIM) 400-80 MG tablet Take 1 tablet by mouth daily. 30 tablet 5   No facility-administered medications prior to visit.     No Known Allergies   Social History   Tobacco Use   Smoking status: Never    Passive exposure: Never   Smokeless tobacco: Never  Vaping Use   Vaping Use: Never used  Substance Use Topics   Alcohol use: Never   Drug use: Never     Social History   Substance and Sexual Activity  Sexual Activity Not Currently   Comment: declined condoms     Objective:   Vitals:   07/01/22 0852 07/01/22 0853  BP: (!) 174/82 (!) 164/82  Pulse: 72   Resp: 16   Temp: 97.8 F (36.6 C)   TempSrc: Temporal   SpO2: 98%   Weight: 135 lb 9.6 oz (61.5 kg)  Body mass index is 25.62 kg/m.   Physical Exam HENT:     Mouth/Throat:     Mouth: No oral lesions.     Dentition: No dental abscesses.  Eyes:     Comments: Left eye is injected and intercanthus.  No pain with any EOM  Cardiovascular:     Rate and Rhythm: Normal rate and regular rhythm.     Heart sounds: Normal heart sounds.  Pulmonary:     Effort: Pulmonary effort is normal.     Breath sounds: Normal breath sounds.  Abdominal:     General: There is no distension.     Palpations: Abdomen is soft.     Tenderness: There is no abdominal tenderness.  Musculoskeletal:        General: No  tenderness. Normal range of motion.  Lymphadenopathy:     Cervical: No cervical adenopathy.  Skin:    General: Skin is warm and dry.     Findings: No rash.  Neurological:     Mental Status: She is alert and oriented to person, place, and time.  Psychiatric:        Judgment: Judgment normal.     Lab Results Lab Results  Component Value Date   WBC 6.0 07/22/2021   HGB 13.3 07/22/2021   HCT 38.0 07/22/2021   MCV 92 07/22/2021   PLT 221 07/22/2021    Lab Results  Component Value Date   CREATININE 0.99 03/17/2022   BUN 10 03/17/2022   NA 137 03/17/2022   K 3.8 03/17/2022   CL 104 03/17/2022   CO2 26 03/17/2022    Lab Results  Component Value Date   ALT 16 03/17/2022   AST 22 03/17/2022   ALKPHOS 46 07/22/2021   BILITOT 1.2 03/17/2022    Lab Results  Component Value Date   CHOL 273 (H) 06/24/2022   HDL 51 06/24/2022   LDLCALC 186 (H) 06/24/2022   TRIG 193 (H) 06/24/2022   CHOLHDL 5.4 (H) 06/24/2022   HIV 1 RNA Quant (Copies/mL)  Date Value  03/17/2022 <20 (H)  12/19/2021 309 (H)  10/03/2021 42 (H)   CD4 T Cell Abs (/uL)  Date Value  03/17/2022 133 (L)  12/19/2021 182 (L)  09/02/2021 110 (L)     Assessment & Plan:   Patient Active Problem List   Diagnosis Date Noted   HIV (human immunodeficiency virus infection) (HCC) 01/19/2019   AIDS (acquired immune deficiency syndrome) (HCC) 11/30/2017   Polypharmacy 03/17/2022   H/O LEEP 02/12/2022   Healthcare maintenance 09/02/2021   Hypothyroidism 07/23/2021   Prediabetes 07/23/2021   Pedal edema 04/17/2020   Chronic pain of left knee 05/16/2019   CVA (cerebral vascular accident) (HCC) 11/25/2017   Stroke (HCC) 12/08/2014   Left-sided weakness    HLD (hyperlipidemia)    Essential hypertension    Cerebrovascular accident (CVA) due to occlusion of cerebral artery (HCC)     Problem List Items Addressed This Visit       High   AIDS (acquired immune deficiency syndrome) (HCC) - Primary    Slow  immunologic recovery with interrupted treatment. She has done better the last 6 months. Repeat CD4 today. Continue bactrim daily (easier than 3x weekly) and return in 51m for the same.       Relevant Medications   rosuvastatin (CRESTOR) 40 MG tablet   sulfamethoxazole-trimethoprim (BACTRIM) 400-80 MG tablet   Other Relevant Orders   HIV 1 RNA quant-no reflex-bld   T-helper cells (CD4) count  HIV (human immunodeficiency virus infection) (HCC) (Chronic)    Under intermittent good virologic control. Last VL < 20 - we discussed and celebrated this today. Reinforced that she needs to continue this indefinitely to allow for immune system recovery.  They have plenty of refills on file.   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 I have given them instructions and contacts to reach out to for these appointments.   Refills of lipitor filled - OK to continue for secondary prevention.   10/31/2022 for return visit scheduled.       Relevant Medications   sulfamethoxazole-trimethoprim (BACTRIM) 400-80 MG tablet     Unprioritized   Essential hypertension    Uncontrolled today - has not picked up the 10 mg tablets yet for BP. Her daughter will get these today. Cardiology recommended addition of spironolactone for next step.       Relevant Medications   rosuvastatin (CRESTOR) 40 MG tablet   H/O LEEP    FU with GYN provided. 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    Rexene Alberts, MSN, NP-C [facility] for Infectious Disease [facility] Pager: [phone] Office: [phone]  07/01/22  10:56 AM

## 2022-07-01 ENCOUNTER — Other Ambulatory Visit: Payer: Self-pay

## 2022-07-01 ENCOUNTER — Encounter: Payer: Self-pay | Admitting: Infectious Diseases

## 2022-07-01 ENCOUNTER — Ambulatory Visit (INDEPENDENT_AMBULATORY_CARE_PROVIDER_SITE_OTHER): Payer: Medicaid Other | Admitting: Infectious Diseases

## 2022-07-01 VITALS — BP 164/82 | HR 72 | Temp 97.8°F | Resp 16 | Wt 135.6 lb

## 2022-07-01 DIAGNOSIS — I10 Essential (primary) hypertension: Secondary | ICD-10-CM

## 2022-07-01 DIAGNOSIS — B20 Human immunodeficiency virus [HIV] disease: Secondary | ICD-10-CM

## 2022-07-01 DIAGNOSIS — Z9889 Other specified postprocedural states: Secondary | ICD-10-CM

## 2022-07-01 MED ORDER — ROSUVASTATIN CALCIUM 40 MG PO TABS: 40.0000 mg | ORAL_TABLET | Freq: Every day | ORAL | 3 refills | Status: DC

## 2022-07-01 MED ORDER — SULFAMETHOXAZOLE-TRIMETHOPRIM 400-80 MG PO TABS: 1.0000 | ORAL_TABLET | Freq: Every day | ORAL | 5 refills | Status: DC

## 2022-07-01 NOTE — Assessment & Plan Note (Signed)
Slow immunologic recovery with interrupted treatment. She has done better the last 6 months. Repeat CD4 today. Continue bactrim daily (easier than 3x weekly) and return in 69m for the same.

## 2022-07-01 NOTE — Assessment & Plan Note (Addendum)
Under intermittent good virologic control. Last VL < 20 - we discussed and celebrated this today. Reinforced that she needs to continue this indefinitely to allow for immune system recovery.  They have plenty of refills on file.   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 I have given them instructions and contacts to reach out to for these appointments.   Refills of lipitor filled - OK to continue for secondary prevention.   10/31/2022 for return visit scheduled.

## 2022-07-01 NOTE — Assessment & Plan Note (Signed)
FU with GYN provided. 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

## 2022-07-01 NOTE — Assessment & Plan Note (Signed)
Uncontrolled today - has not picked up the 10 mg tablets yet for BP. Her daughter will get these today. Cardiology recommended addition of spironolactone for next step.

## 2022-07-01 NOTE — Addendum Note (Signed)
Addended by: Blanchard Kelch on: 07/01/2022 01:48 PM   Modules accepted: Level of Service

## 2022-07-02 LAB — T-HELPER CELLS (CD4) COUNT (NOT AT ARMC)
CD4 % Helper T Cell: 10 % — ABNORMAL LOW (ref 33–65)
CD4 T Cell Abs: 205 /uL — ABNORMAL LOW (ref 400–1790)

## 2022-07-03 LAB — HIV-1 RNA QUANT-NO REFLEX-BLD
HIV 1 RNA Quant: <20 Copies/mL — ABNORMAL HIGH
HIV-1 RNA Quant, Log: <1.3 Log cps/mL — ABNORMAL HIGH

## 2022-07-04 ENCOUNTER — Telehealth: Payer: Self-pay

## 2022-07-04 NOTE — Progress Notes (Signed)
Please call Elaysia's daughter [name] (who speaks wonderful english) that her viral load remains undetectable and her immune system is still showing good signs of improvement.   I want her to continue taking the bactrim antibiotic and biktarvy everyday until she comes back to see me on 10/31/2022.   Thank you

## 2022-07-04 NOTE — Telephone Encounter (Signed)
Attempted to contact [name](patients daughter) who speaks english but was unable to reach her. She can be reached at [phone].

## 2022-07-04 NOTE — Telephone Encounter (Signed)
-----   Message from Blanchard Kelch, NP sent at 07/04/2022 11:23 AM EDT ----- Please call Sandra [name]'s daughter Sandra [name] (who speaks wonderful english) that her viral load remains undetectable and her immune system is still showing good signs of improvement.   I want her to continue taking the bactrim antibiotic and biktarvy everyday until she comes back to see me on 10/31/2022.   Thank you

## 2022-07-07 DIAGNOSIS — Z419 Encounter for procedure for purposes other than remedying health state, unspecified: Secondary | ICD-10-CM | POA: Diagnosis not present

## 2022-07-07 NOTE — Telephone Encounter (Signed)
Second attempt contacting patient daughter [name] - left voicemail asking to return my call .   Modean Mccullum Lesli Albee, CMA

## 2022-07-08 NOTE — Telephone Encounter (Signed)
I spoke to patient's daughter [name] and advised her of the patient's lab results

## 2022-07-15 ENCOUNTER — Telehealth: Payer: Self-pay

## 2022-07-15 ENCOUNTER — Other Ambulatory Visit: Payer: Self-pay

## 2022-07-15 DIAGNOSIS — Z1231 Encounter for screening mammogram for malignant neoplasm of breast: Secondary | ICD-10-CM

## 2022-07-15 NOTE — Telephone Encounter (Signed)
Chart review completed for patient. Patient is due for screening mammogram. I spoke with her daughter and scheduled her with mobile mammogram unit 08/08/22. Elijio Miles, [facility] Specialist

## 2022-07-17 ENCOUNTER — Other Ambulatory Visit: Payer: Self-pay

## 2022-07-17 MED ORDER — EZETIMIBE 10 MG PO TABS: 10.0000 mg | ORAL_TABLET | Freq: Every day | ORAL | 3 refills | Status: DC

## 2022-07-17 NOTE — Progress Notes (Signed)
Prescription sent to pharmacy.

## 2022-08-07 DIAGNOSIS — Z419 Encounter for procedure for purposes other than remedying health state, unspecified: Secondary | ICD-10-CM | POA: Diagnosis not present

## 2022-09-07 DIAGNOSIS — Z419 Encounter for procedure for purposes other than remedying health state, unspecified: Secondary | ICD-10-CM | POA: Diagnosis not present

## 2022-09-11 NOTE — Telephone Encounter (Signed)
See routing comment(s) for message information.

## 2022-09-15 ENCOUNTER — Encounter: Payer: Self-pay | Admitting: Family

## 2022-09-15 ENCOUNTER — Ambulatory Visit (INDEPENDENT_AMBULATORY_CARE_PROVIDER_SITE_OTHER): Payer: Medicaid Other | Admitting: Family

## 2022-09-15 VITALS — BP 207/105 | HR 78 | Temp 98.4°F | Ht 60.0 in | Wt 136.8 lb

## 2022-09-15 DIAGNOSIS — Z603 Acculturation difficulty: Secondary | ICD-10-CM | POA: Diagnosis not present

## 2022-09-15 DIAGNOSIS — E039 Hypothyroidism, unspecified: Secondary | ICD-10-CM | POA: Diagnosis not present

## 2022-09-15 DIAGNOSIS — Z8673 Personal history of transient ischemic attack (TIA), and cerebral infarction without residual deficits: Secondary | ICD-10-CM

## 2022-09-15 DIAGNOSIS — Z758 Other problems related to medical facilities and other health care: Secondary | ICD-10-CM | POA: Diagnosis not present

## 2022-09-15 NOTE — Progress Notes (Signed)
Patient ID: Sandra [name], female    DOB: November 15, 1955  MRN: [id number]  CC: Follow-Up  Subjective: Sandra [name] is a 67 y.o. female who presents for follow-up. She is accompanied by her daughter Sandra [name].   Her concerns today include:  - Patient established with Cardiology for chronic conditions management. Patient's daughter states patient has not been taking blood pressure medications. Patient's daughter states they are planning to pickup blood pressure medications from pharmacy today. The patient does not complain of red flag symptoms such as but not limited to chest pain, shortness of breath, worst headache of life, nausea/vomiting.  - Need new referral to Neurology for history of stroke. Daughter states previous referral would not schedule patient due to owing their office money for a bill. - Patient not currently taking Levothyroxine.  - No further issues/concerns for discussion today.  Patient Active Problem List   Diagnosis Date Noted   Polypharmacy 03/17/2022   H/O LEEP 02/12/2022   Healthcare maintenance 09/02/2021   Hypothyroidism 07/23/2021   Prediabetes 07/23/2021   Pedal edema 04/17/2020   Chronic pain of left knee 05/16/2019   HIV (human immunodeficiency virus infection) (HCC) 01/19/2019   AIDS (acquired immune deficiency syndrome) (HCC) 11/30/2017   CVA (cerebral vascular accident) (HCC) 11/25/2017   Stroke (HCC) 12/08/2014   Left-sided weakness    HLD (hyperlipidemia)    Essential hypertension    Cerebrovascular accident (CVA) due to occlusion of cerebral artery (HCC)      Current Outpatient Medications on File Prior to Visit  Medication Sig Dispense Refill   amLODipine (NORVASC) 10 MG tablet Take 1 tablet (10 mg total) by mouth daily. 90 tablet 3   rosuvastatin (CRESTOR) 40 MG tablet Take 1 tablet (40 mg total) by mouth daily. 90 tablet 3   sulfamethoxazole-trimethoprim (BACTRIM) 400-80 MG tablet Take 1 tablet by mouth daily. 30 tablet 5   aspirin EC 81 MG  tablet Take 1 tablet (81 mg total) by mouth daily. Swallow whole. 90 tablet 3   bictegravir-emtricitabine-tenofovir AF (BIKTARVY) 50-200-25 MG TABS tablet Take 1 tablet by mouth daily. 30 tablet 11   ezetimibe (ZETIA) 10 MG tablet Take 1 tablet (10 mg total) by mouth daily. 90 tablet 3   levothyroxine (SYNTHROID) 25 MCG tablet Take 1 tablet (25 mcg total) by mouth daily. 30 tablet 5   valsartan-hydrochlorothiazide (DIOVAN-HCT) 80-12.5 MG tablet Take 1 tablet by mouth daily. 90 tablet 3   No current facility-administered medications on file prior to visit.    No Known Allergies  Social History   Socioeconomic History   Marital status: Single    Spouse name: Not on file   Number of children: Not on file   Years of education: Not on file   Highest education level: Not on file  Occupational History   Not on file  Tobacco Use   Smoking status: Never    Passive exposure: Never   Smokeless tobacco: Never  Vaping Use   Vaping status: Never Used  Substance and Sexual Activity   Alcohol use: Never   Drug use: Never   Sexual activity: Not Currently    Comment: declined condoms  Other Topics Concern   Not on file  Social History Narrative   ** Merged History Encounter **       Social Determinants of Health   Financial Resource Strain: Low Risk  (11/25/2017)   Overall Financial Resource Strain (CARDIA)    Difficulty of Paying Living Expenses: Not hard at all  Food Insecurity:  No Food Insecurity (11/25/2017)   Hunger Vital Sign    Worried About Running Out of Food in the Last Year: Never true    Ran Out of Food in the Last Year: Never true  Transportation Needs: No Transportation Needs (07/16/2022)   PRAPARE - Administrator, Civil Service (Medical): No    Lack of Transportation (Non-Medical): No  Physical Activity: Unknown (11/25/2017)   Exercise Vital Sign    Days of Exercise per Week: Patient declined    Minutes of Exercise per Session: Patient declined  Stress:  No Stress Concern Present (11/25/2017)   [employer] of Occupational Health - Occupational Stress Questionnaire    Feeling of Stress : Not at all  Social Connections: Unknown (11/25/2017)   Social Connection and Isolation Panel [NHANES]    Frequency of Communication with Friends and Family: More than three times a week    Frequency of Social Gatherings with Friends and Family: More than three times a week    Attends Religious Services: More than 4 times per year    Active Member of [employer] or Organizations: No    Attends Banker Meetings: Never    Marital Status: Patient declined  Intimate Partner Violence: Unknown (11/25/2017)   Humiliation, Afraid, Rape, and Kick questionnaire    Fear of Current or Ex-Partner: Patient declined    Emotionally Abused: Patient declined    Physically Abused: Patient declined    Sexually Abused: Patient declined    Family History  Problem Relation Age of Onset   Stroke Mother    Stroke Sister    Colon cancer Neg Hx    Esophageal cancer Neg Hx    Rectal cancer Neg Hx    Stomach cancer Neg Hx     Past Surgical History:  Procedure Laterality Date   COLPOSCOPY W/ BIOPSY / CURETTAGE  10/31/2021   ENDOMETRIAL BIOPSY  10/31/2021   EYE SURGERY     LEEP  01/09/2022    ROS: Review of Systems Negative except as stated above  PHYSICAL EXAM: BP (!) 207/105   Pulse 78   Temp 98.4 F (36.9 C) (Oral)   Ht 5' (1.524 m)   Wt 136 lb 12.8 oz (62.1 kg)   SpO2 95%   BMI 26.72 kg/m   Physical Exam HENT:     Head: Normocephalic and atraumatic.     Nose: Nose normal.     Mouth/Throat:     Mouth: Mucous membranes are moist.     Pharynx: Oropharynx is clear.  Eyes:     Extraocular Movements: Extraocular movements intact.     Conjunctiva/sclera: Conjunctivae normal.     Pupils: Pupils are equal, round, and reactive to light.  Cardiovascular:     Rate and Rhythm: Normal rate and regular rhythm.     Pulses: Normal pulses.     Heart  sounds: Normal heart sounds.  Pulmonary:     Effort: Pulmonary effort is normal.     Breath sounds: Normal breath sounds.  Musculoskeletal:        General: Normal range of motion.     Cervical back: Normal range of motion and neck supple.  Neurological:     General: No focal deficit present.     Mental Status: She is alert and oriented to person, place, and time.  Psychiatric:        Mood and Affect: Mood normal.        Behavior: Behavior normal.      ASSESSMENT  AND PLAN: 1. Hypothyroidism, unspecified type - Routine screening.  - TSH  2. History of CVA (cerebrovascular accident) - Referral to Neurology for evaluation/management. - Ambulatory referral to Neurology  3. Language barrier - Patient accompanied by her daughter who serves as interpreter and part-historian.  Patient was given the opportunity to ask questions.  Patient verbalized understanding of the plan and was able to repeat key elements of the plan. Patient was given clear instructions to go to Emergency Department or return to medical center if symptoms don't improve, worsen, or new problems develop.The patient verbalized understanding.   Orders Placed This Encounter  Procedures   TSH   Ambulatory referral to Neurology    Follow-up with primary provider as scheduled. Rema Fendt, NP

## 2022-09-16 ENCOUNTER — Other Ambulatory Visit: Payer: Self-pay | Admitting: Family

## 2022-09-16 DIAGNOSIS — E039 Hypothyroidism, unspecified: Secondary | ICD-10-CM

## 2022-09-16 LAB — TSH: TSH: 2.2 u[IU]/mL (ref 0.450–4.500)

## 2022-09-16 MED ORDER — LEVOTHYROXINE SODIUM 25 MCG PO TABS
25.0000 ug | ORAL_TABLET | Freq: Every day | ORAL | 0 refills | Status: DC
Start: 2022-09-16 — End: 2023-04-10

## 2022-10-07 DIAGNOSIS — Z419 Encounter for procedure for purposes other than remedying health state, unspecified: Secondary | ICD-10-CM | POA: Diagnosis not present

## 2022-10-31 ENCOUNTER — Telehealth: Payer: Self-pay

## 2022-10-31 ENCOUNTER — Ambulatory Visit: Payer: Medicaid Other | Admitting: Infectious Diseases

## 2022-10-31 NOTE — Telephone Encounter (Signed)
Attempted to contact patient daughter who speaks english to reschedule NO show with Judeth Cornfield 10/31/2022 [phone] but was unable to reach her/Left VM.

## 2022-11-20 ENCOUNTER — Ambulatory Visit (INDEPENDENT_AMBULATORY_CARE_PROVIDER_SITE_OTHER): Payer: Medicaid Other | Admitting: Infectious Diseases

## 2022-11-20 ENCOUNTER — Encounter: Payer: Self-pay | Admitting: Infectious Diseases

## 2022-11-20 ENCOUNTER — Other Ambulatory Visit: Payer: Self-pay

## 2022-11-20 VITALS — BP 177/84 | HR 76 | Temp 98.1°F | Wt 136.0 lb

## 2022-11-20 DIAGNOSIS — Z23 Encounter for immunization: Secondary | ICD-10-CM | POA: Diagnosis not present

## 2022-11-20 DIAGNOSIS — B20 Human immunodeficiency virus [HIV] disease: Secondary | ICD-10-CM | POA: Diagnosis not present

## 2022-11-20 MED ORDER — BIKTARVY 50-200-25 MG PO TABS: 1.0000 | ORAL_TABLET | Freq: Every day | ORAL | 11 refills | Status: DC

## 2022-11-20 MED ORDER — SULFAMETHOXAZOLE-TRIMETHOPRIM 400-80 MG PO TABS: 1.0000 | ORAL_TABLET | Freq: Every day | ORAL | 5 refills | Status: DC

## 2022-11-20 NOTE — Patient Instructions (Addendum)
YOUR PLAN:  - continue your BACTRIM and BIKTARVY everyday for now   -HYPERTENSION: Hypertension means high blood pressure. You should start monitoring your blood pressure at home once or twice a week, both in the morning and evening. If your readings are consistently over 140/90, you should follow up with your primary care physician for possible medication adjustment.  -INFLUENZA VACCINATION: You have not received your flu shot for this year. We will administer the influenza vaccine today.  -COVID-19 BOOSTER VACCINATION: You have not received a COVID-19 booster shot since 2022. We recommend you get a COVID-19 booster vaccine at your local pharmacy at your convenience.  -GENERAL HEALTH MAINTENANCE: We will order routine blood tests, including kidney and liver function tests, and a diabetes screening. We will schedule a follow-up visit in 4 months.  INSTRUCTIONS Please monitor your blood pressure at home once or twice a week, both in the morning and evening. If your readings are consistently over 140/90, follow up with your primary care physician.  Get your COVID-19 booster vaccine at your local pharmacy at your convenience.  . We will see you again in 4 months for a follow-up visit.:

## 2022-11-20 NOTE — Progress Notes (Signed)
Name: Sandra [name]  DOB: Nov 08, 1955 MRN: [id number] PCP: Rema Fendt, NP    Subjective:   Chief Complaint  Patient presents with   Follow-up    Bik- one- two weeks/      Discussed the use of AI scribe software for clinical note transcription with the patient, who gave verbal consent to proceed.   History of Present Illness   The patient, known to have HIV and on Biktarvy, presented for a routine follow-up. She reported no new health concerns or symptoms. She has been compliant with her medication regimen, including Biktarvy and Bactrim, an antibiotic. She has not been monitoring her blood pressure at home, but it was noted to be high during the visit. The patient has not received her annual flu shot or a COVID-19 booster shot. She denied any recent travel or plans for family gatherings. She has no history of diabetes.       Review of Systems  Constitutional:  Negative for chills, fever, malaise/fatigue and weight loss.  HENT:  Negative for sore throat.   Respiratory:  Negative for cough, sputum production and shortness of breath.   Cardiovascular: Negative.   Gastrointestinal:  Negative for abdominal pain, diarrhea and vomiting.  Musculoskeletal:  Negative for joint pain, myalgias and neck pain.  Skin:  Negative for rash.  Neurological:  Negative for tingling, focal weakness and headaches.  Psychiatric/Behavioral:  Negative for depression and substance abuse. The patient is not nervous/anxious.      Past Medical History:  Diagnosis Date   Arthritis    Headache    HIV (human immunodeficiency virus infection) (HCC) 01/19/2019   Hypertension    Hypothyroidism 07/23/2021   Prediabetes 07/23/2021   Stroke [facility])     Outpatient Medications Prior to Visit  Medication Sig Dispense Refill   aspirin EC 81 MG tablet Take 1 tablet (81 mg total) by mouth daily. Swallow whole. 90 tablet 3   levothyroxine (SYNTHROID) 25 MCG tablet Take 1 tablet (25 mcg total) by mouth daily.  90 tablet 0   valsartan-hydrochlorothiazide (DIOVAN-HCT) 80-12.5 MG tablet Take 1 tablet by mouth daily. 90 tablet 3   bictegravir-emtricitabine-tenofovir AF (BIKTARVY) 50-200-25 MG TABS tablet Take 1 tablet by mouth daily. 30 tablet 11   sulfamethoxazole-trimethoprim (BACTRIM) 400-80 MG tablet Take 1 tablet by mouth daily. 30 tablet 5   amLODipine (NORVASC) 10 MG tablet Take 1 tablet (10 mg total) by mouth daily. 90 tablet 3   ezetimibe (ZETIA) 10 MG tablet Take 1 tablet (10 mg total) by mouth daily. 90 tablet 3   rosuvastatin (CRESTOR) 40 MG tablet Take 1 tablet (40 mg total) by mouth daily. 90 tablet 3   No facility-administered medications prior to visit.     No Known Allergies   Social History   Tobacco Use   Smoking status: Never    Passive exposure: Never   Smokeless tobacco: Never  Vaping Use   Vaping status: Never Used  Substance Use Topics   Alcohol use: Never   Drug use: Never     Social History   Substance and Sexual Activity  Sexual Activity Not Currently   Comment: declined condoms     Objective:   Vitals:   11/20/22 1329  BP: (!) 177/84  Pulse: 76  Temp: 98.1 F (36.7 C)  TempSrc: Oral  SpO2: 99%  Weight: 136 lb (61.7 kg)    Body mass index is 26.56 kg/m.   Physical Exam HENT:     Mouth/Throat:  Mouth: No oral lesions.     Dentition: No dental abscesses.  Eyes:     Comments: Left eye is injected and intercanthus.  No pain with any EOM  Cardiovascular:     Rate and Rhythm: Normal rate and regular rhythm.     Heart sounds: Normal heart sounds.  Pulmonary:     Effort: Pulmonary effort is normal.     Breath sounds: Normal breath sounds.  Abdominal:     General: There is no distension.     Palpations: Abdomen is soft.     Tenderness: There is no abdominal tenderness.  Musculoskeletal:        General: No tenderness. Normal range of motion.  Lymphadenopathy:     Cervical: No cervical adenopathy.  Skin:    General: Skin is warm and  dry.     Findings: No rash.  Neurological:     Mental Status: She is alert and oriented to person, place, and time.  Psychiatric:        Judgment: Judgment normal.     Lab Results Lab Results  Component Value Date   WBC 6.0 07/22/2021   HGB 13.3 07/22/2021   HCT 38.0 07/22/2021   MCV 92 07/22/2021   PLT 221 07/22/2021    Lab Results  Component Value Date   CREATININE 0.99 03/17/2022   BUN 10 03/17/2022   NA 137 03/17/2022   K 3.8 03/17/2022   CL 104 03/17/2022   CO2 26 03/17/2022    Lab Results  Component Value Date   ALT 16 03/17/2022   AST 22 03/17/2022   ALKPHOS 46 07/22/2021   BILITOT 1.2 03/17/2022    Lab Results  Component Value Date   CHOL 273 (H) 06/24/2022   HDL 51 06/24/2022   LDLCALC 186 (H) 06/24/2022   TRIG 193 (H) 06/24/2022   CHOLHDL 5.4 (H) 06/24/2022   HIV 1 RNA Quant (Copies/mL)  Date Value  07/01/2022 <20 (H)  03/17/2022 <20 (H)  12/19/2021 309 (H)   CD4 T Cell Abs (/uL)  Date Value  07/01/2022 205 (L)  03/17/2022 133 (L)  12/19/2021 182 (L)     Assessment & Plan:     Hypertension Elevated blood pressure noted during visit. No home monitoring currently in place. -Advise to monitor blood pressure at home once or twice a week, both morning and evening. -If readings consistently over 140/90, recommend follow-up with primary care physician for possible medication adjustment.  HIV / AIDS + Stable on Biktarvy. No reported issues with medication adherence. They are tolerating the medication well without side effects. No drug interactions identified. Pertinent lab tests ordered today.  No changes to insurance coverage.  No dental needs today.  No concern over anxious/depressed mood.  Statin secondary prevention with h/o stroke  -Continue Biktarvy as prescribed. -Check CD4 count to assess immune system status.  Pneumocystis pneumonia prophylaxis Currently on Bactrim without reported issues. -Continue Bactrim daily. -Refill  prescription for Bactrim. -If CD4 count remains above 200, consider discontinuing Bactrim.  Influenza vaccination No record of vaccination for the current year. -Administer influenza vaccine today.  COVID-19 booster vaccination Last received in 2022. -Recommend receiving COVID-19 booster vaccine at local pharmacy at patient's convenience.  General Health Maintenance -Order routine blood tests including kidney and liver function tests, and diabetes screening. -Schedule follow-up visit in 4 months.      Meds ordered this encounter  Medications   bictegravir-emtricitabine-tenofovir AF (BIKTARVY) 50-200-25 MG TABS tablet    Sig: Take 1 tablet  by mouth daily.    Dispense:  30 tablet    Refill:  11   sulfamethoxazole-trimethoprim (BACTRIM) 400-80 MG tablet    Sig: Take 1 tablet by mouth daily.    Dispense:  30 tablet    Refill:  5   Orders Placed This Encounter  Procedures   T-helper cells (CD4) count   HIV 1 RNA quant-no reflex-bld   Lipid panel   COMPLETE METABOLIC PANEL WITH GFR   CBC   Hemoglobin A1c     Return in about 4 months (around 03/20/2023).    Rexene Alberts, MSN, NP-C [facility] Infectious Disease [facility] Pager: [phone] Office: [phone]  11/20/22  3:09 PM

## 2022-11-21 LAB — T-HELPER CELLS (CD4) COUNT (NOT AT ARMC)
CD4 % Helper T Cell: 9 % — ABNORMAL LOW (ref 33–65)
CD4 T Cell Abs: 219 /uL — ABNORMAL LOW (ref 400–1790)

## 2022-11-24 LAB — HEMOGLOBIN A1C
Hgb A1c MFr Bld: 6.3 %{Hb} — ABNORMAL HIGH (ref <5.7)
Mean Plasma Glucose: 134 mg/dL
eAG (mmol/L): 7.4 mmol/L

## 2022-11-24 LAB — LIPID PANEL
Cholesterol: 221 mg/dL — ABNORMAL HIGH (ref <200)
HDL: 52 mg/dL (ref >=50)
LDL Cholesterol (Calc): 139 mg/dL — ABNORMAL HIGH
Non-HDL Cholesterol (Calc): 169 mg/dL — ABNORMAL HIGH (ref <130)
Total CHOL/HDL Ratio: 4.3 (calc) (ref <5.0)
Triglycerides: 166 mg/dL — ABNORMAL HIGH (ref <150)

## 2022-11-24 LAB — COMPLETE METABOLIC PANEL WITH GFR
AG Ratio: 1.2 (calc) (ref 1.0–2.5)
ALT: 14 U/L (ref 6–29)
AST: 19 U/L (ref 10–35)
Albumin: 4.7 g/dL (ref 3.6–5.1)
Alkaline phosphatase (APISO): 66 U/L (ref 37–153)
BUN: 13 mg/dL (ref 7–25)
CO2: 27 mmol/L (ref 20–32)
Calcium: 9.9 mg/dL (ref 8.6–10.4)
Chloride: 102 mmol/L (ref 98–110)
Creat: 0.78 mg/dL (ref 0.50–1.05)
Globulin: 3.8 g/dL — ABNORMAL HIGH (ref 1.9–3.7)
Glucose, Bld: 88 mg/dL (ref 65–99)
Potassium: 4.1 mmol/L (ref 3.5–5.3)
Sodium: 137 mmol/L (ref 135–146)
Total Bilirubin: 0.6 mg/dL (ref 0.2–1.2)
Total Protein: 8.5 g/dL — ABNORMAL HIGH (ref 6.1–8.1)
eGFR: 83 mL/min/{1.73_m2} (ref >=60)

## 2022-11-24 LAB — CBC
HCT: 42.5 % (ref 35.0–45.0)
Hemoglobin: 14.1 g/dL (ref 11.7–15.5)
MCH: 30.3 pg (ref 27.0–33.0)
MCHC: 33.2 g/dL (ref 32.0–36.0)
MCV: 91.4 fL (ref 80.0–100.0)
MPV: 13.1 fL — ABNORMAL HIGH (ref 7.5–12.5)
Platelets: 274 10*3/uL (ref 140–400)
RBC: 4.65 10*6/uL (ref 3.80–5.10)
RDW: 12.3 % (ref 11.0–15.0)
WBC: 5.6 10*3/uL (ref 3.8–10.8)

## 2022-11-24 LAB — HIV-1 RNA QUANT-NO REFLEX-BLD
HIV 1 RNA Quant: 9620 {copies}/mL — ABNORMAL HIGH
HIV-1 RNA Quant, Log: 3.98 {Log_copies}/mL — ABNORMAL HIGH

## 2022-12-07 DIAGNOSIS — Z419 Encounter for procedure for purposes other than remedying health state, unspecified: Secondary | ICD-10-CM | POA: Diagnosis not present

## 2022-12-09 ENCOUNTER — Ambulatory Visit: Payer: Medicaid Other | Attending: Internal Medicine | Admitting: Internal Medicine

## 2023-01-07 DIAGNOSIS — Z419 Encounter for procedure for purposes other than remedying health state, unspecified: Secondary | ICD-10-CM | POA: Diagnosis not present

## 2023-02-07 DIAGNOSIS — Z419 Encounter for procedure for purposes other than remedying health state, unspecified: Secondary | ICD-10-CM | POA: Diagnosis not present

## 2023-03-07 DIAGNOSIS — Z419 Encounter for procedure for purposes other than remedying health state, unspecified: Secondary | ICD-10-CM | POA: Diagnosis not present

## 2023-03-20 ENCOUNTER — Other Ambulatory Visit: Payer: Self-pay

## 2023-03-20 ENCOUNTER — Other Ambulatory Visit

## 2023-03-20 DIAGNOSIS — Z113 Encounter for screening for infections with a predominantly sexual mode of transmission: Secondary | ICD-10-CM

## 2023-03-20 DIAGNOSIS — B20 Human immunodeficiency virus [HIV] disease: Secondary | ICD-10-CM

## 2023-03-20 DIAGNOSIS — Z79899 Other long term (current) drug therapy: Secondary | ICD-10-CM | POA: Diagnosis not present

## 2023-03-20 NOTE — Addendum Note (Signed)
 Addended by: Valarie Cones on: 03/20/2023 09:04 AM   Modules accepted: Orders

## 2023-03-21 LAB — C. TRACHOMATIS/N. GONORRHOEAE RNA
C. trachomatis RNA, TMA: NOT DETECTED
N. gonorrhoeae RNA, TMA: NOT DETECTED

## 2023-03-22 LAB — CBC WITH DIFFERENTIAL/PLATELET
Absolute Lymphocytes: 2516 {cells}/uL (ref 850–3900)
Absolute Monocytes: 322 {cells}/uL (ref 200–950)
Basophils Absolute: 9 {cells}/uL (ref 0–200)
Basophils Relative: 0.2 %
Eosinophils Absolute: 193 {cells}/uL (ref 15–500)
Eosinophils Relative: 4.2 %
HCT: 41.6 % (ref 35.0–45.0)
Hemoglobin: 13.8 g/dL (ref 11.7–15.5)
MCH: 30.5 pg (ref 27.0–33.0)
MCHC: 33.2 g/dL (ref 32.0–36.0)
MCV: 91.8 fL (ref 80.0–100.0)
MPV: 13 fL — ABNORMAL HIGH (ref 7.5–12.5)
Monocytes Relative: 7 %
Neutro Abs: 1559 {cells}/uL (ref 1500–7800)
Neutrophils Relative %: 33.9 %
Platelets: 222 10*3/uL (ref 140–400)
RBC: 4.53 10*6/uL (ref 3.80–5.10)
RDW: 12.9 % (ref 11.0–15.0)
Total Lymphocyte: 54.7 %
WBC: 4.6 10*3/uL (ref 3.8–10.8)

## 2023-03-22 LAB — COMPLETE METABOLIC PANEL WITH GFR
AG Ratio: 1.3 (calc) (ref 1.0–2.5)
ALT: 9 U/L (ref 6–29)
AST: 15 U/L (ref 10–35)
Albumin: 4.4 g/dL (ref 3.6–5.1)
Alkaline phosphatase (APISO): 57 U/L (ref 37–153)
BUN: 10 mg/dL (ref 7–25)
CO2: 29 mmol/L (ref 20–32)
Calcium: 9.6 mg/dL (ref 8.6–10.4)
Chloride: 103 mmol/L (ref 98–110)
Creat: 1.02 mg/dL (ref 0.50–1.05)
Globulin: 3.4 g/dL (ref 1.9–3.7)
Glucose, Bld: 107 mg/dL — ABNORMAL HIGH (ref 65–99)
Potassium: 3.7 mmol/L (ref 3.5–5.3)
Sodium: 139 mmol/L (ref 135–146)
Total Bilirubin: 0.6 mg/dL (ref 0.2–1.2)
Total Protein: 7.8 g/dL (ref 6.1–8.1)
eGFR: 60 mL/min/{1.73_m2} (ref >=60)

## 2023-03-22 LAB — HIV-1 RNA QUANT-NO REFLEX-BLD
HIV 1 RNA Quant: 22 {copies}/mL — ABNORMAL HIGH
HIV-1 RNA Quant, Log: 1.34 {Log_copies}/mL — ABNORMAL HIGH

## 2023-03-22 LAB — T-HELPER CELLS (CD4) COUNT (NOT AT ARMC)
Absolute CD4: 183 {cells}/uL — ABNORMAL LOW (ref 490–1740)
CD4 T Helper %: 8 % — ABNORMAL LOW (ref 30–61)
Total lymphocyte count: 2227 {cells}/uL (ref 850–3900)

## 2023-03-22 LAB — LIPID PANEL
Cholesterol: 258 mg/dL — ABNORMAL HIGH (ref <200)
HDL: 48 mg/dL — ABNORMAL LOW (ref >=50)
LDL Cholesterol (Calc): 178 mg/dL — ABNORMAL HIGH
Non-HDL Cholesterol (Calc): 210 mg/dL — ABNORMAL HIGH (ref <130)
Total CHOL/HDL Ratio: 5.4 (calc) — ABNORMAL HIGH (ref <5.0)
Triglycerides: 168 mg/dL — ABNORMAL HIGH (ref <150)

## 2023-03-22 LAB — RPR: RPR Ser Ql: NONREACTIVE

## 2023-04-10 ENCOUNTER — Other Ambulatory Visit: Payer: Self-pay

## 2023-04-10 ENCOUNTER — Other Ambulatory Visit (HOSPITAL_COMMUNITY): Payer: Self-pay

## 2023-04-10 ENCOUNTER — Encounter: Payer: Self-pay | Admitting: Infectious Diseases

## 2023-04-10 ENCOUNTER — Ambulatory Visit: Admitting: Infectious Diseases

## 2023-04-10 VITALS — BP 162/88 | HR 69 | Temp 98.2°F | Ht 60.0 in | Wt 135.0 lb

## 2023-04-10 DIAGNOSIS — I10 Essential (primary) hypertension: Secondary | ICD-10-CM

## 2023-04-10 DIAGNOSIS — B20 Human immunodeficiency virus [HIV] disease: Secondary | ICD-10-CM | POA: Diagnosis not present

## 2023-04-10 DIAGNOSIS — E039 Hypothyroidism, unspecified: Secondary | ICD-10-CM

## 2023-04-10 MED ORDER — EZETIMIBE 10 MG PO TABS
10.0000 mg | ORAL_TABLET | Freq: Every day | ORAL | 3 refills | Status: AC
  Filled 2023-04-10 – 2023-04-13 (×2): qty 90, 90d supply, fill #0
  Filled 2023-04-14: qty 30, 30d supply, fill #0
  Filled 2023-04-29 – 2023-05-13 (×3): qty 30, 30d supply, fill #1
  Filled 2023-05-29 – 2023-06-04 (×3): qty 30, 30d supply, fill #2
  Filled 2023-07-09: qty 30, 30d supply, fill #3
  Filled 2023-07-27 – 2023-08-06 (×4): qty 30, 30d supply, fill #4
  Filled 2023-09-08: qty 30, 30d supply, fill #5
  Filled 2023-10-08 – 2023-10-13 (×2): qty 30, 30d supply, fill #6
  Filled 2023-11-04 (×2): qty 30, 30d supply, fill #7
  Filled 2023-11-26: qty 30, 30d supply, fill #8
  Filled 2024-01-12: qty 30, 30d supply, fill #9
  Filled 2024-02-11: qty 30, 30d supply, fill #10

## 2023-04-10 MED ORDER — LEVOTHYROXINE SODIUM 25 MCG PO TABS
25.0000 ug | ORAL_TABLET | Freq: Every day | ORAL | 3 refills | Status: AC
  Filled 2023-04-10 – 2023-04-13 (×2): qty 90, 90d supply, fill #0
  Filled 2023-04-14: qty 30, 30d supply, fill #0
  Filled 2023-04-29 – 2023-05-13 (×3): qty 30, 30d supply, fill #1
  Filled 2023-05-29 – 2023-06-04 (×3): qty 30, 30d supply, fill #2
  Filled 2023-07-09: qty 30, 30d supply, fill #3
  Filled 2023-07-27 – 2023-08-06 (×4): qty 30, 30d supply, fill #4
  Filled 2023-09-08: qty 30, 30d supply, fill #5
  Filled 2023-10-08 – 2023-10-13 (×2): qty 30, 30d supply, fill #6
  Filled 2023-11-04 (×2): qty 30, 30d supply, fill #7
  Filled 2023-11-26: qty 30, 30d supply, fill #8
  Filled 2024-01-12: qty 30, 30d supply, fill #9
  Filled 2024-02-11: qty 30, 30d supply, fill #10

## 2023-04-10 MED ORDER — ROSUVASTATIN CALCIUM 40 MG PO TABS
40.0000 mg | ORAL_TABLET | Freq: Every day | ORAL | 3 refills | Status: AC
  Filled 2023-04-10 – 2023-04-13 (×2): qty 90, 90d supply, fill #0
  Filled 2023-04-14: qty 30, 30d supply, fill #0
  Filled 2023-04-29 – 2023-05-13 (×3): qty 30, 30d supply, fill #1
  Filled 2023-05-29 – 2023-06-04 (×3): qty 30, 30d supply, fill #2
  Filled 2023-07-09: qty 30, 30d supply, fill #3
  Filled 2023-07-27 – 2023-08-06 (×4): qty 30, 30d supply, fill #4
  Filled 2023-09-08: qty 30, 30d supply, fill #5
  Filled 2023-10-08 – 2023-10-13 (×2): qty 30, 30d supply, fill #6
  Filled 2023-11-04 (×2): qty 30, 30d supply, fill #7
  Filled 2023-11-26: qty 30, 30d supply, fill #8
  Filled 2024-01-12: qty 30, 30d supply, fill #9
  Filled 2024-02-11: qty 30, 30d supply, fill #10

## 2023-04-10 MED ORDER — SULFAMETHOXAZOLE-TRIMETHOPRIM 400-80 MG PO TABS
1.0000 | ORAL_TABLET | Freq: Every day | ORAL | 3 refills | Status: DC
  Filled 2023-04-10: qty 34, 34d supply, fill #0
  Filled 2023-04-13: qty 90, 90d supply, fill #0
  Filled 2023-04-14: qty 30, 30d supply, fill #0
  Filled 2023-04-29 – 2023-05-13 (×3): qty 30, 30d supply, fill #1
  Filled 2023-05-29 – 2023-06-04 (×3): qty 30, 30d supply, fill #2
  Filled 2023-07-09: qty 30, 30d supply, fill #3
  Filled 2023-07-27 – 2023-08-06 (×4): qty 30, 30d supply, fill #4
  Filled 2023-09-08: qty 30, 30d supply, fill #5
  Filled 2023-10-08 – 2023-10-13 (×2): qty 30, 30d supply, fill #6
  Filled 2023-11-04 (×2): qty 30, 30d supply, fill #7
  Filled 2023-11-26: qty 30, 30d supply, fill #8
  Filled 2024-01-12: qty 30, 30d supply, fill #9

## 2023-04-10 MED ORDER — AMLODIPINE BESYLATE 10 MG PO TABS
10.0000 mg | ORAL_TABLET | Freq: Every day | ORAL | 0 refills | Status: DC
  Filled 2023-04-10 – 2023-04-13 (×2): qty 90, 90d supply, fill #0
  Filled 2023-04-14: qty 30, 30d supply, fill #0
  Filled 2023-04-29 – 2023-05-13 (×3): qty 30, 30d supply, fill #1
  Filled 2023-05-29 – 2023-06-04 (×3): qty 30, 30d supply, fill #2

## 2023-04-10 MED ORDER — VALSARTAN-HYDROCHLOROTHIAZIDE 80-12.5 MG PO TABS
1.0000 | ORAL_TABLET | Freq: Every day | ORAL | 3 refills | Status: DC
  Filled 2023-04-10 – 2023-04-13 (×2): qty 90, 90d supply, fill #0
  Filled 2023-04-14: qty 30, 30d supply, fill #0
  Filled 2023-04-29 – 2023-05-13 (×3): qty 30, 30d supply, fill #1
  Filled 2023-05-29 – 2023-06-04 (×3): qty 30, 30d supply, fill #2
  Filled 2023-07-09: qty 30, 30d supply, fill #3
  Filled 2023-07-27 – 2023-08-06 (×4): qty 30, 30d supply, fill #4
  Filled 2023-09-08: qty 30, 30d supply, fill #5
  Filled 2023-10-08 – 2023-10-13 (×2): qty 30, 30d supply, fill #6
  Filled 2023-11-04 (×2): qty 30, 30d supply, fill #7
  Filled 2023-11-26: qty 30, 30d supply, fill #8
  Filled 2024-01-12: qty 30, 30d supply, fill #9

## 2023-04-10 MED ORDER — ASPIRIN 81 MG PO TBEC: 81.0000 mg | DELAYED_RELEASE_TABLET | Freq: Every day | ORAL | 3 refills | Status: AC

## 2023-04-10 MED ORDER — BIKTARVY 50-200-25 MG PO TABS
1.0000 | ORAL_TABLET | Freq: Every day | ORAL | 3 refills | Status: DC
  Filled 2023-04-10: qty 30, 30d supply, fill #0
  Filled 2023-04-30 (×2): qty 30, 30d supply, fill #1
  Filled 2023-05-29: qty 30, 30d supply, fill #2
  Filled 2023-07-23 – 2023-10-22 (×3): qty 30, 30d supply, fill #3
  Filled 2023-11-12 – 2023-11-13 (×2): qty 30, 30d supply, fill #4

## 2023-04-10 NOTE — Progress Notes (Signed)
 Specialty Pharmacy Initial Fill Coordination Note  Sandra [name] is a 67 y.o. female contacted today regarding initial fill of specialty medication(s) No data recorded  Patient requested Delivery   Delivery date: 04/13/23   Verified address: Patient address [address]  [place] [address]   Medication will be filled on 04/10/23.   Patient is aware of 0.00 copayment.

## 2023-04-10 NOTE — Patient Instructions (Addendum)
 I sent all of your medications to the [place] pharmacy to blister package them for 90 day supply - this I am hopeful will help you get what you need regularly.   Please come back in 3 or 4 months for labs and a visit with me 2 weeks later - we need to recheck your thyroid function as well for your primary care team (Amy Zonia Kief)   You are due for an appointment with your primary care provider as well to follow up on blood pressure and thyroid if you can give them a call.

## 2023-04-10 NOTE — Progress Notes (Signed)
 Name: Sandra [name]  DOB: May 25, 1955 MRN: [id number] PCP: Rema Fendt, NP    Subjective:   Chief Complaint  Patient presents with   Follow-up     Discussed the use of AI scribe software for clinical note transcription with the patient, who gave verbal consent to proceed.   History of Present Illness   Sandra [name] is a 68 year old female with HIV who presents for medication management and follow-up. She is accompanied by her son-in-law.  She is currently managing her HIV with Biktarvy once daily and taking prophylactic SS Bactrim daily. Recent lab work shows an undetectable viral load and a CD4 count approaching 200, indicating some improvement in her immune system. She frequently forgets to call for medication refills and there are delays getting her medications from pharmacy. She has had several missed appointments as well and several expired prescriptions.  She is on Medicaid.   There is a concern about her blood pressure management, as it is unclear when she last filled her blood pressure medication. The last known fill was in September (90d), and there is uncertainty about her current adherence to this medication regimen.  Her family, including her daughter Sandra [name], plays a significant role in her care. They have young children, which adds to the complexity of managing her health needs. The family is supportive, and there is an effort to streamline her medication refills to reduce the burden on them.       Review of Systems  Constitutional:  Negative for chills, fever, malaise/fatigue and weight loss.  HENT:  Negative for sore throat.   Respiratory:  Negative for cough, sputum production and shortness of breath.   Cardiovascular: Negative.   Gastrointestinal:  Negative for abdominal pain, diarrhea and vomiting.  Musculoskeletal:  Negative for joint pain, myalgias and neck pain.  Skin:  Negative for rash.  Neurological:  Negative for tingling, focal weakness and  headaches.  Psychiatric/Behavioral:  Negative for depression and substance abuse. The patient is not nervous/anxious.      Past Medical History:  Diagnosis Date   Arthritis    Headache    HIV (human immunodeficiency virus infection) (HCC) 01/19/2019   Hypertension    Hypothyroidism 07/23/2021   Prediabetes 07/23/2021   Stroke [facility])     Outpatient Medications Prior to Visit  Medication Sig Dispense Refill   amLODipine (NORVASC) 10 MG tablet Take 1 tablet (10 mg total) by mouth daily. 90 tablet 3   bictegravir-emtricitabine-tenofovir AF (BIKTARVY) 50-200-25 MG TABS tablet Take 1 tablet by mouth daily. 30 tablet 11   sulfamethoxazole-trimethoprim (BACTRIM) 400-80 MG tablet Take 1 tablet by mouth daily. 30 tablet 5   aspirin EC 81 MG tablet Take 1 tablet (81 mg total) by mouth daily. Swallow whole. (Patient not taking: Reported on 04/10/2023) 90 tablet 3   ezetimibe (ZETIA) 10 MG tablet Take 1 tablet (10 mg total) by mouth daily. (Patient not taking: Reported on 04/10/2023) 90 tablet 3   levothyroxine (SYNTHROID) 25 MCG tablet Take 1 tablet (25 mcg total) by mouth daily. 90 tablet 0   rosuvastatin (CRESTOR) 40 MG tablet Take 1 tablet (40 mg total) by mouth daily. (Patient not taking: Reported on 04/10/2023) 90 tablet 3   valsartan-hydrochlorothiazide (DIOVAN-HCT) 80-12.5 MG tablet Take 1 tablet by mouth daily. 90 tablet 3   No facility-administered medications prior to visit.     No Known Allergies   Social History   Tobacco Use   Smoking status: Never    Passive exposure:  Never   Smokeless tobacco: Never  Vaping Use   Vaping status: Never Used  Substance Use Topics   Alcohol use: Never   Drug use: Never     Social History   Substance and Sexual Activity  Sexual Activity Not Currently     Objective:   Vitals:   04/10/23 0938 04/10/23 1007  BP: (!) 176/82 (!) 162/88  Pulse: 69   Temp: 98.2 F (36.8 C)   TempSrc: Temporal   SpO2: 98%   Weight: 135 lb (61.2 kg)    Height: 5' (1.524 m)     Body mass index is 26.37 kg/m.   Physical Exam HENT:     Mouth/Throat:     Mouth: No oral lesions.     Dentition: No dental abscesses.  Eyes:     Comments: Left eye is injected and intercanthus.  No pain with any EOM  Cardiovascular:     Rate and Rhythm: Normal rate and regular rhythm.     Heart sounds: Normal heart sounds.  Pulmonary:     Effort: Pulmonary effort is normal.     Breath sounds: Normal breath sounds.  Abdominal:     General: There is no distension.     Palpations: Abdomen is soft.     Tenderness: There is no abdominal tenderness.  Musculoskeletal:        General: No tenderness. Normal range of motion.  Lymphadenopathy:     Cervical: No cervical adenopathy.  Skin:    General: Skin is warm and dry.     Findings: No rash.  Neurological:     Mental Status: She is alert and oriented to person, place, and time.  Psychiatric:        Judgment: Judgment normal.     Lab Results Lab Results  Component Value Date   WBC 4.6 03/20/2023   HGB 13.8 03/20/2023   HCT 41.6 03/20/2023   MCV 91.8 03/20/2023   PLT 222 03/20/2023    Lab Results  Component Value Date   CREATININE 1.02 03/20/2023   BUN 10 03/20/2023   NA 139 03/20/2023   K 3.7 03/20/2023   CL 103 03/20/2023   CO2 29 03/20/2023    Lab Results  Component Value Date   ALT 9 03/20/2023   AST 15 03/20/2023   ALKPHOS 46 07/22/2021   BILITOT 0.6 03/20/2023    Lab Results  Component Value Date   CHOL 258 (H) 03/20/2023   HDL 48 (L) 03/20/2023   LDLCALC 178 (H) 03/20/2023   TRIG 168 (H) 03/20/2023   CHOLHDL 5.4 (H) 03/20/2023   HIV 1 RNA Quant  Date Value  03/20/2023 22 copies/mL (H)  11/20/2022 9,620 Copies/mL (H)  07/01/2022 <20 Copies/mL (H)   CD4 T Cell Abs (/uL)  Date Value  11/20/2022 219 (L)  07/01/2022 205 (L)  03/17/2022 133 (L)     Assessment & Plan:       HIV infection, AIDS -  Intermittent Virologic Control with CD4 < 200 - HIV viral load  undetectable, CD4 count approaching 200. Continued antiretroviral therapy with Biktarvy essential for viral suppression and immune recovery. Largest barrier has been filling regularly at the pharmacy - now that she has Medicaid we talked about sending all prescriptions to [facility] for specialty program and blister packaging in addition to 90d fills to improve adherence and barriers.  - Transition to a pharmacy that provides blister packaging and mails medications to her home to ensure adherence. - Send 90-day supplies of all  medications to the pharmacy for blister packaging. - Schedule follow-up in 3 months to monitor HIV status and medication adherence.  Hypertension -  Potential non-adherence to hypertension medication noted. Blister packaging to aid adherence in addition to 90d fills. - Include blood pressure medication in the blister packaging to improve adherence. - Monitor blood pressure control at the next follow-up visit. - I asked her and her son in law to please reschedule visit with PCP    Hypothyroidism -  No symptoms associated with uncontrolled condition. Continue 25 mcg synthroid daily for now          Meds ordered this encounter  Medications   ezetimibe (ZETIA) 10 MG tablet    Sig: Take 1 tablet (10 mg total) by mouth daily.    Dispense:  90 tablet    Refill:  3   levothyroxine (SYNTHROID) 25 MCG tablet    Sig: Take 1 tablet (25 mcg total) by mouth daily.    Dispense:  90 tablet    Refill:  3   rosuvastatin (CRESTOR) 40 MG tablet    Sig: Take 1 tablet (40 mg total) by mouth daily.    Dispense:  90 tablet    Refill:  3   sulfamethoxazole-trimethoprim (BACTRIM) 400-80 MG tablet    Sig: Take 1 tablet by mouth daily.    Dispense:  90 tablet    Refill:  3   valsartan-hydrochlorothiazide (DIOVAN-HCT) 80-12.5 MG tablet    Sig: Take 1 tablet by mouth daily.    Dispense:  90 tablet    Refill:  3   bictegravir-emtricitabine-tenofovir AF (BIKTARVY) 50-200-25 MG TABS tablet     Sig: Take 1 tablet by mouth daily.    Dispense:  90 tablet    Refill:  3    Patient would like blister packed medications 90 day supply if possible    Prescription Type::   New Therapy - New Program   aspirin EC 81 MG tablet    Sig: Take 1 tablet (81 mg total) by mouth daily. Swallow whole.    Dispense:  90 tablet    Refill:  3   amLODipine (NORVASC) 10 MG tablet    Sig: Take 1 tablet (10 mg total) by mouth daily.    Dispense:  90 tablet    Refill:  0   Orders Placed This Encounter  Procedures   HIV 1 RNA quant-no reflex-bld    Standing Status:   Future    Expected Date:   07/10/2023    Expiration Date:   04/09/2024   T-helper cells (CD4) count    Standing Status:   Future    Expected Date:   07/10/2023    Expiration Date:   04/09/2024   TSH + free T4    Standing Status:   Future    Expected Date:   07/10/2023    Expiration Date:   04/09/2024     Return in about 3 months (around 07/10/2023).    Rexene Alberts, MSN, NP-C [facility] for Infectious Disease [facility] Pager: [phone] Office: [phone]  04/10/23  11:33 AM

## 2023-04-13 ENCOUNTER — Other Ambulatory Visit: Payer: Self-pay | Admitting: Pharmacist

## 2023-04-13 ENCOUNTER — Other Ambulatory Visit: Payer: Self-pay

## 2023-04-13 NOTE — Progress Notes (Signed)
 Specialty Pharmacy Initiation Note   Sandra [name] is a 68 y.o. female who will be followed by the specialty pharmacy service for RxSp HIV    Review of administration, indication, effectiveness, safety, potential side effects, storage/disposable, and missed dose instructions occurred today for patient's specialty medication(s) Bictegravir-Emtricitab-Tenofov [facility])     Patient/Caregiver did not have any additional questions or concerns.   Patient's therapy is appropriate to: Continue    Goals Addressed             This Visit's Progress    Achieve Undetectable HIV Viral Load < 20       Patient is on track. Patient will work on increased adherence         Jennette Kettle Specialty Pharmacist

## 2023-04-14 ENCOUNTER — Other Ambulatory Visit (HOSPITAL_COMMUNITY): Payer: Self-pay

## 2023-04-14 ENCOUNTER — Other Ambulatory Visit: Payer: Self-pay

## 2023-04-15 ENCOUNTER — Other Ambulatory Visit: Payer: Self-pay

## 2023-04-15 ENCOUNTER — Other Ambulatory Visit (HOSPITAL_COMMUNITY): Payer: Self-pay

## 2023-04-16 ENCOUNTER — Other Ambulatory Visit: Payer: Self-pay

## 2023-04-17 ENCOUNTER — Other Ambulatory Visit: Payer: Self-pay

## 2023-04-18 DIAGNOSIS — Z419 Encounter for procedure for purposes other than remedying health state, unspecified: Secondary | ICD-10-CM | POA: Diagnosis not present

## 2023-04-20 ENCOUNTER — Other Ambulatory Visit: Payer: Self-pay

## 2023-04-23 ENCOUNTER — Other Ambulatory Visit: Payer: Self-pay

## 2023-04-28 ENCOUNTER — Other Ambulatory Visit: Payer: Self-pay

## 2023-04-28 ENCOUNTER — Other Ambulatory Visit (HOSPITAL_COMMUNITY): Payer: Self-pay

## 2023-04-29 ENCOUNTER — Other Ambulatory Visit: Payer: Self-pay

## 2023-04-30 ENCOUNTER — Other Ambulatory Visit: Payer: Self-pay | Admitting: Pharmacy Technician

## 2023-04-30 ENCOUNTER — Other Ambulatory Visit: Payer: Self-pay

## 2023-04-30 NOTE — Progress Notes (Signed)
 Specialty Pharmacy Refill Coordination Note  Raya Mckinstry is a 68 y.o. female contacted today regarding refills of specialty medication(s) Bictegravir-Emtricitab-Tenofov (Biktarvy )   Patient requested Delivery   Delivery date: 05/08/23   Verified address: [address]   [place] [address]   Medication will be filled on 05/07/23.   Spoke to patient's daughter.

## 2023-05-07 ENCOUNTER — Other Ambulatory Visit: Payer: Self-pay

## 2023-05-11 ENCOUNTER — Other Ambulatory Visit: Payer: Self-pay

## 2023-05-12 ENCOUNTER — Other Ambulatory Visit: Payer: Self-pay

## 2023-05-13 ENCOUNTER — Other Ambulatory Visit: Payer: Self-pay

## 2023-05-14 ENCOUNTER — Other Ambulatory Visit: Payer: Self-pay

## 2023-05-15 ENCOUNTER — Other Ambulatory Visit: Payer: Self-pay

## 2023-05-18 DIAGNOSIS — Z419 Encounter for procedure for purposes other than remedying health state, unspecified: Secondary | ICD-10-CM | POA: Diagnosis not present

## 2023-05-19 ENCOUNTER — Other Ambulatory Visit: Payer: Self-pay

## 2023-05-19 ENCOUNTER — Other Ambulatory Visit (HOSPITAL_COMMUNITY)
Admission: RE | Admit: 2023-05-19 | Discharge: 2023-05-19 | Disposition: A | Discharge status: Home / Self Care | Source: Ambulatory Visit | Attending: Obstetrics and Gynecology | Admitting: Obstetrics and Gynecology

## 2023-05-19 ENCOUNTER — Encounter: Payer: Self-pay | Admitting: Obstetrics and Gynecology

## 2023-05-19 ENCOUNTER — Ambulatory Visit: Admitting: Obstetrics and Gynecology

## 2023-05-19 VITALS — BP 189/99 | HR 65 | Ht 60.0 in | Wt 140.0 lb

## 2023-05-19 DIAGNOSIS — Z603 Acculturation difficulty: Secondary | ICD-10-CM | POA: Diagnosis not present

## 2023-05-19 DIAGNOSIS — Z124 Encounter for screening for malignant neoplasm of cervix: Secondary | ICD-10-CM

## 2023-05-19 DIAGNOSIS — D069 Carcinoma in situ of cervix, unspecified: Secondary | ICD-10-CM | POA: Diagnosis not present

## 2023-05-19 DIAGNOSIS — Z758 Other problems related to medical facilities and other health care: Secondary | ICD-10-CM | POA: Diagnosis not present

## 2023-05-19 DIAGNOSIS — N888 Other specified noninflammatory disorders of cervix uteri: Secondary | ICD-10-CM | POA: Diagnosis not present

## 2023-05-19 DIAGNOSIS — N72 Inflammatory disease of cervix uteri: Secondary | ICD-10-CM | POA: Diagnosis not present

## 2023-05-19 HISTORY — PX: COLPOSCOPY: PRO47

## 2023-05-19 NOTE — Progress Notes (Unsigned)
 Obstetrics and Gynecology Visit Return Patient Evaluation  Appointment Date: 05/19/2023  Primary Care Provider: Senaida Dama  OBGYN Clinic: [facility]  Chief Complaint: follow up CIN 3  History of Present Illness:  patient with poor compliance and hasn't been evaluated since January 2024. 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 No CV or pulmonary s/s.   Review of Systems: as noted in the History of Present Illness.   Patient Active Problem List   Diagnosis Date Noted   Polypharmacy 03/17/2022   H/O LEEP 02/12/2022   Healthcare maintenance 09/02/2021   Hypothyroidism 07/23/2021   Prediabetes 07/23/2021   Pedal edema 04/17/2020   Chronic pain of left knee 05/16/2019   HIV (human immunodeficiency virus infection) (HCC) 01/19/2019   AIDS (acquired immune deficiency syndrome) (HCC) 11/30/2017   CVA (cerebral vascular accident) (HCC) 11/25/2017   Stroke (HCC) 12/08/2014   Left-sided weakness    HLD (hyperlipidemia)    Essential hypertension    Cerebrovascular accident (CVA) due to occlusion of cerebral artery (HCC)    Medications:  Lucresha Zalenski had no medications administered during this visit. Current Outpatient Medications  Medication Sig Dispense Refill   amLODipine  (NORVASC ) 10 MG tablet Take 1 tablet (10 mg total) by mouth daily. 90 tablet 0   aspirin  EC 81 MG tablet Take 1 tablet (81 mg total) by mouth daily. Swallow whole. 90 tablet 3   bictegravir-emtricitabine -tenofovir  AF (BIKTARVY ) 50-200-25 MG TABS tablet  Take 1 tablet by mouth daily. 90 tablet 3   ezetimibe  (ZETIA ) 10 MG tablet Take 1 tablet (10 mg total) by mouth daily. 90 tablet 3   levothyroxine  (SYNTHROID ) 25 MCG tablet Take 1 tablet (25 mcg total) by mouth daily. 90 tablet 3   rosuvastatin  (CRESTOR ) 40 MG tablet Take 1 tablet (40 mg total) by mouth daily. 90 tablet 3   sulfamethoxazole -trimethoprim  (BACTRIM ) 400-80 MG tablet Take 1 tablet by mouth daily. 90 tablet 3   valsartan -hydrochlorothiazide  (DIOVAN -HCT) 80-12.5 MG tablet Take 1 tablet by mouth daily. 90 tablet 3   No current facility-administered medications for this visit.    Allergies: has no known allergies.  Physical Exam:  BP (!) 189/99   Pulse 65   Ht 5' (1.524 m)   Wt 140 lb (63.5 kg)   BMI 27.34 kg/m  Body mass index is 27.34 kg/m. General appearance: Well nourished, well developed female in no acute distress.  Neuro/Psych:  Normal mood and affect.    Pelvic exam:  Cervical exam performed in the presence of a chaperone EGBUS: moderate atrophy Vaginal vault: moderate atrophy Cervix:  near flush with the vagnial wall with only definite cervix seen is posterior lip from about 7 to 5 o'clock. Pinpoint external os with polyp vs friable tissue coming from it, measuring <17mm Bimanual: deferred  See procedure note for colpo details   Assessment: patient stable  Plan:  1. Cervical cancer screening (Primary) D/w her and son in law, who interpreted for her today (daughter not available) and signed form, that given her history I recommend pap and doing colpo today as high chance of  needing a colpo anyways and her history of compliance issues.   Will call patient with results but will make 1 month f/u appointment in case I'm unable to get in contact with her  2. Language barrier  Return in about 1 month (around 06/19/2023) for in person.  Future Appointments  Date Time Provider Department Center  07/28/2023  9:15 AM Orson Blalock, NP [facility]     Raynell Caller, Marieta Shorten MD Attending Center for [facility])

## 2023-05-20 ENCOUNTER — Encounter: Payer: Self-pay | Admitting: Obstetrics and Gynecology

## 2023-05-20 DIAGNOSIS — Z603 Acculturation difficulty: Secondary | ICD-10-CM | POA: Insufficient documentation

## 2023-05-20 NOTE — Procedures (Signed)
 Colposcopy Procedure Note  Pre-operative Diagnosis:  01/09/2022 LEEP: 12-4 o'clock with CIN 1 and CIN 1 at ectocervical margins; Remainder LEEP focuse of cin 3 with extension to underlying EC glands and CIN3 at the ectocervical margins, cervix 9-12 o'clock with CIN 3 with extension into underlying EC glands and CIN 3 at the endocervical margins; ECC +CIN3 10/31/2021 colpo (inadequate): CIN 2-3 on colposcopy biopsies and ECC  Post-operative Diagnosis: cervical dysplasia  Procedure Details  Urine pregnancy test: No. Cervical exam performed in the presence of a chaperone The risks (including infection, bleeding, pain) and benefits of the procedure were explained to the patient and written informed consent was obtained.  The patient was placed in the dorsal lithotomy position. A Pederson was speculum inserted in the vagina, and the cervix was visualized.  Acetic acid staining was done and the cervix was viewed with green filter; lugol's staining with green filter was also done.  Biopsy and removal of the polyp and 6 o'clock cervical biopsy done and then single toothed tenaculum applied and endocervical curettage in all four quadrants done although not sure if I got fully into the endocervical canal. There was no bleeding after procedure with silver nitrate application  Findings:  EGBUS: moderate atrophy Vaginal vault: moderate atrophy Cervix:  near flush with the vagnial wall with only definite cervix seen is posterior lip from about 7 to 5 o'clock. Pinpoint external os with polyp vs friable tissue coming from it, measuring <36mm. Mild awe changes at the polyp and near os with no lugol's staining Bimanual: deferred  Adequate: No  Specimens: cervical polyp and cervix at six o'clock (sent together); endocervical curettage  Condition: Stable  Complications: None  Plan: Follow up biopsies   Tyler Gallant MD Attending Center for [facility])

## 2023-05-21 LAB — CYTOLOGY - PAP
Chlamydia: NEGATIVE
Comment: NEGATIVE
Comment: NEGATIVE
Comment: NEGATIVE
Comment: NEGATIVE
Comment: NEGATIVE
Comment: NORMAL
Diagnosis: UNDETERMINED — AB
HPV 16: NEGATIVE
HPV 18 / 45: NEGATIVE
High risk HPV: POSITIVE — AB
Neisseria Gonorrhea: NEGATIVE
Trichomonas: NEGATIVE

## 2023-05-21 LAB — SURGICAL PATHOLOGY

## 2023-05-26 ENCOUNTER — Other Ambulatory Visit: Payer: Self-pay

## 2023-05-29 ENCOUNTER — Other Ambulatory Visit: Payer: Self-pay

## 2023-05-29 ENCOUNTER — Other Ambulatory Visit (HOSPITAL_COMMUNITY): Payer: Self-pay

## 2023-05-29 NOTE — Progress Notes (Signed)
 Specialty Pharmacy Refill Coordination Note  Spoke with patient's daughter, Leward Record.  Sandra [name] is a 68 y.o. female contacted today regarding refills of specialty medication(s) Bictegravir-Emtricitab-Tenofov (Biktarvy )   Patient requested Delivery   Delivery date: 06/05/23   Verified address: [address]   [place] [address]   Medication will be filled on 06/04/23.

## 2023-06-04 ENCOUNTER — Other Ambulatory Visit: Payer: Self-pay

## 2023-06-04 ENCOUNTER — Other Ambulatory Visit (HOSPITAL_COMMUNITY): Payer: Self-pay

## 2023-06-05 ENCOUNTER — Other Ambulatory Visit: Payer: Self-pay

## 2023-06-05 ENCOUNTER — Other Ambulatory Visit (HOSPITAL_COMMUNITY): Payer: Self-pay

## 2023-06-11 ENCOUNTER — Ambulatory Visit: Payer: Self-pay | Admitting: Obstetrics and Gynecology

## 2023-06-12 NOTE — Telephone Encounter (Signed)
 TC to pt daughter Leward Record made aware to repeat pt Pap in 1 yr.

## 2023-06-12 NOTE — Telephone Encounter (Signed)
-----   Message from [place] sent at 06/11/2023  4:40 PM EDT ----- Please let her or her daughter or son in law know that I recommend she still has some slightly abnormal cells and I recommend repeat testing in one year (mid 2026). Thanks

## 2023-06-18 DIAGNOSIS — Z419 Encounter for procedure for purposes other than remedying health state, unspecified: Secondary | ICD-10-CM | POA: Diagnosis not present

## 2023-06-24 ENCOUNTER — Ambulatory Visit: Admitting: Obstetrics and Gynecology

## 2023-07-02 ENCOUNTER — Other Ambulatory Visit: Payer: Self-pay

## 2023-07-02 ENCOUNTER — Other Ambulatory Visit: Payer: Self-pay | Admitting: Infectious Diseases

## 2023-07-02 ENCOUNTER — Other Ambulatory Visit (HOSPITAL_COMMUNITY): Payer: Self-pay

## 2023-07-02 MED ORDER — AMLODIPINE BESYLATE 10 MG PO TABS
10.0000 mg | ORAL_TABLET | Freq: Every day | ORAL | 3 refills | Status: AC
  Filled 2023-07-09: qty 30, 30d supply, fill #0
  Filled 2023-07-27 – 2023-08-06 (×3): qty 30, 30d supply, fill #1
  Filled 2023-09-08: qty 30, 30d supply, fill #2
  Filled 2023-10-08 – 2023-10-13 (×2): qty 30, 30d supply, fill #3
  Filled 2023-11-04 (×2): qty 30, 30d supply, fill #4
  Filled 2023-11-26: qty 30, 30d supply, fill #5
  Filled 2024-01-12: qty 30, 30d supply, fill #6
  Filled 2024-02-11: qty 30, 30d supply, fill #7

## 2023-07-02 NOTE — Telephone Encounter (Signed)
 Patient has PCP on file, would you like them to manage BP?

## 2023-07-03 ENCOUNTER — Other Ambulatory Visit (HOSPITAL_COMMUNITY): Payer: Self-pay

## 2023-07-09 ENCOUNTER — Other Ambulatory Visit: Payer: Self-pay

## 2023-07-09 ENCOUNTER — Other Ambulatory Visit (HOSPITAL_COMMUNITY): Payer: Self-pay

## 2023-07-13 ENCOUNTER — Other Ambulatory Visit: Payer: Self-pay

## 2023-07-15 ENCOUNTER — Other Ambulatory Visit: Payer: Self-pay

## 2023-07-16 ENCOUNTER — Other Ambulatory Visit: Payer: Self-pay

## 2023-07-18 DIAGNOSIS — Z419 Encounter for procedure for purposes other than remedying health state, unspecified: Secondary | ICD-10-CM | POA: Diagnosis not present

## 2023-07-21 ENCOUNTER — Other Ambulatory Visit (HOSPITAL_COMMUNITY): Payer: Self-pay

## 2023-07-22 ENCOUNTER — Other Ambulatory Visit: Payer: Self-pay

## 2023-07-23 ENCOUNTER — Other Ambulatory Visit: Payer: Self-pay

## 2023-07-27 ENCOUNTER — Other Ambulatory Visit (HOSPITAL_COMMUNITY): Payer: Self-pay

## 2023-07-27 ENCOUNTER — Other Ambulatory Visit: Payer: Self-pay

## 2023-07-28 ENCOUNTER — Other Ambulatory Visit: Payer: Self-pay

## 2023-07-28 ENCOUNTER — Other Ambulatory Visit (HOSPITAL_COMMUNITY): Payer: Self-pay

## 2023-07-28 ENCOUNTER — Ambulatory Visit: Admitting: Infectious Diseases

## 2023-07-28 ENCOUNTER — Encounter: Payer: Self-pay | Admitting: Infectious Diseases

## 2023-07-28 VITALS — BP 179/85 | HR 83 | Temp 98.0°F | Resp 18 | Ht 63.0 in | Wt 135.0 lb

## 2023-07-28 DIAGNOSIS — E039 Hypothyroidism, unspecified: Secondary | ICD-10-CM

## 2023-07-28 DIAGNOSIS — B20 Human immunodeficiency virus [HIV] disease: Secondary | ICD-10-CM | POA: Diagnosis not present

## 2023-07-28 DIAGNOSIS — Z23 Encounter for immunization: Secondary | ICD-10-CM

## 2023-07-28 MED ORDER — SHINGRIX 50 MCG/0.5ML IM SUSR
0.5000 mL | Freq: Once | INTRAMUSCULAR | 0 refills | Status: AC
  Filled 2023-07-28 (×2): qty 0.5, 1d supply, fill #0

## 2023-07-28 NOTE — Patient Instructions (Addendum)
 Nice to see you!   Please stop by the lab on your way out.   No changes to your medications today.   Will plan to see you back in November

## 2023-07-28 NOTE — Progress Notes (Signed)
 Name: Sandra [name]  DOB: [birth year] MRN: [id number] PCP: Lorren Greig PARAS, NP    Subjective:   Chief Complaint  Patient presents with   Follow-up     Discussed the use of AI scribe software for clinical note transcription with the patient, who gave verbal consent to proceed.   History of Present Illness   Sandra [name] is a 68 year old female with HIV who presents for follow-up care. She is accompanied by her son-in-law, Gwenn.  Her last office visit was in April 2025, at which time her viral load was undetectable and her CD4 count was 183. She adheres to her medication regimen, taking Biktarvy  daily. She has had her medications come in a blister packaging which has been very helpful for her.   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 She is under the care of a gynecologist for this issue and is scheduled to repeat her Pap smear in mid-2026.  Her home blood pressure readings have been within range.       Review of Systems  Constitutional:  Negative for chills and fever.  HENT:  Negative for tinnitus.   Eyes:  Negative for blurred vision and photophobia.  Respiratory:  Negative for cough and sputum production.   Cardiovascular:  Negative for chest pain.  Gastrointestinal:  Negative for diarrhea, nausea and vomiting.  Genitourinary:  Negative for dysuria.  Skin:  Negative for rash.  Neurological:  Negative for headaches.     Past Medical History:  Diagnosis Date   Arthritis    Headache    HIV (human immunodeficiency virus infection) (HCC) 01/19/2019   Hypertension    Hypothyroidism 07/23/2021   Prediabetes 07/23/2021   Stroke [facility])     Outpatient Medications Prior to Visit  Medication Sig Dispense Refill   amLODipine  (NORVASC ) 10 MG tablet Take 1 tablet (10 mg total) by mouth daily. 90 tablet 3   aspirin  EC 81 MG tablet Take 1 tablet (81 mg total) by mouth daily.  Swallow whole. 90 tablet 3   bictegravir-emtricitabine -tenofovir  AF (BIKTARVY ) 50-200-25 MG TABS tablet Take 1 tablet by mouth daily. 90 tablet 3   ezetimibe  (ZETIA ) 10 MG tablet Take 1 tablet (10 mg total) by mouth daily. 90 tablet 3   levothyroxine  (SYNTHROID ) 25 MCG tablet Take 1 tablet (25 mcg total) by mouth daily. 90 tablet 3   rosuvastatin  (CRESTOR ) 40 MG tablet Take 1 tablet (40 mg total) by mouth daily. 90 tablet 3   sulfamethoxazole -trimethoprim  (BACTRIM ) 400-80 MG tablet Take 1 tablet by mouth daily. 90 tablet 3   valsartan -hydrochlorothiazide  (DIOVAN -HCT) 80-12.5 MG tablet Take 1 tablet by mouth daily. 90 tablet 3   No facility-administered medications prior to visit.     No Known Allergies   Social History   Tobacco Use   Smoking status: Never    Passive exposure: Never   Smokeless tobacco: Never  Vaping Use   Vaping status: Never Used  Substance Use Topics   Alcohol use: Never   Drug use: Never     Social History   Substance and Sexual Activity  Sexual Activity Not Currently     Objective:   Vitals:   07/28/23 0920  BP: (!) 179/85  Pulse: 83  Resp: 18  Temp: 98 F (36.7 C)  TempSrc: Oral  Weight: 135 lb (61.2 kg)  Height: 5' 3 (1.6 m)    Body mass index is 23.91 kg/m.  Physical Exam Constitutional:      Appearance: Normal appearance. She is not ill-appearing.  HENT:     Mouth/Throat:     Mouth: Mucous membranes are moist.     Pharynx: Oropharynx is clear.  Eyes:     General: No scleral icterus. Cardiovascular:     Rate and Rhythm: Normal rate and regular rhythm.  Pulmonary:     Effort: Pulmonary effort is normal.  Skin:    General: Skin is warm.  Neurological:     Mental Status: She is oriented to person, place, and time.  Psychiatric:        Mood and Affect: Mood normal.        Thought Content: Thought content normal.     Lab Results Lab Results  Component Value Date   WBC 4.6 03/20/2023   HGB 13.8 03/20/2023   HCT 41.6  03/20/2023   MCV 91.8 03/20/2023   PLT 222 03/20/2023    Lab Results  Component Value Date   CREATININE 1.02 03/20/2023   BUN 10 03/20/2023   NA 139 03/20/2023   K 3.7 03/20/2023   CL 103 03/20/2023   CO2 29 03/20/2023    Lab Results  Component Value Date   ALT 9 03/20/2023   AST 15 03/20/2023   ALKPHOS 46 07/22/2021   BILITOT 0.6 03/20/2023    Lab Results  Component Value Date   CHOL 258 (H) 03/20/2023   HDL 48 (L) 03/20/2023   LDLCALC 178 (H) 03/20/2023   TRIG 168 (H) 03/20/2023   CHOLHDL 5.4 (H) 03/20/2023   HIV 1 RNA Quant  Date Value  03/20/2023 22 copies/mL (H)  11/20/2022 9,620 Copies/mL (H)  07/01/2022 <20 Copies/mL (H)   CD4 T Cell Abs (/uL)  Date Value  11/20/2022 219 (L)  07/01/2022 205 (L)  03/17/2022 133 (L)     Assessment & Plan:       HIV infection - HIV infection is well-controlled with an undetectable viral load as of April. CD4 count was 183. She is adherent to her Biktarvy  regimen, crucial for maintaining viral suppression and immune recovery.  - Recheck viral load and CD4 count today - Continue Biktarvy  regimen - Continue prophylactic antibiotic therapy - Discussed the importance of adherence to medication regimen  Abnormal Pap smear with high-grade squamous intraepithelial lesion (HSIL) - Abnormal Pap smear with HSIL followed by colposcopy revealed high-grade changes. She is under gynecological care with a plan to repeat Pap smear in mid-2026. - Continue follow-up with gynecology for HSIL management  Hypothyroidism -  Will check TSH/T4 panel. Synthroid  adjustment to follow if needed.   Preventive care and shingles vaccination - Discussed the importance of preventive care in the context of her age and HIV status, specifically regarding the shingles vaccine. The Shingrix  vaccine can prevent shingles, which is more likely to occur as she ages. Side effects may include a sore arm and possible chills, typically resolving in a day or two. -  Prescribe Shingrix  vaccine and administer first dose today at the pharmacy - Educate about potential side effects of the Shingrix  vaccine, including sore arm and possible chills, which typically resolve in a day or two          Meds ordered this encounter  Medications   Zoster Vaccine Adjuvanted (SHINGRIX ) injection    Sig: Inject 0.5 mLs into the muscle once for 1 dose.    Dispense:  0.5 mL    Refill:  0   Orders Placed This Encounter  Procedures   HIV 1 RNA quant-no reflex-bld   T-helper cells (CD4) count   TSH + free T4     Return in about 4 months (around 11/28/2023).    Corean Fireman, MSN, NP-C [facility] for Infectious Disease [facility] Pager: [phone] Office: [phone]  07/28/23  9:49 AM

## 2023-07-30 ENCOUNTER — Other Ambulatory Visit: Payer: Self-pay

## 2023-07-30 LAB — HIV-1 RNA QUANT-NO REFLEX-BLD
HIV 1 RNA Quant: 87 {copies}/mL — ABNORMAL HIGH
HIV-1 RNA Quant, Log: 1.94 {Log_copies}/mL — ABNORMAL HIGH

## 2023-07-30 LAB — TSH+FREE T4: TSH W/REFLEX TO FT4: 1.06 m[IU]/L (ref 0.40–4.50)

## 2023-08-06 ENCOUNTER — Other Ambulatory Visit: Payer: Self-pay

## 2023-08-07 ENCOUNTER — Other Ambulatory Visit: Payer: Self-pay

## 2023-08-10 ENCOUNTER — Other Ambulatory Visit: Payer: Self-pay

## 2023-08-18 DIAGNOSIS — Z419 Encounter for procedure for purposes other than remedying health state, unspecified: Secondary | ICD-10-CM | POA: Diagnosis not present

## 2023-08-26 ENCOUNTER — Other Ambulatory Visit: Payer: Self-pay

## 2023-09-08 ENCOUNTER — Other Ambulatory Visit: Payer: Self-pay

## 2023-09-09 ENCOUNTER — Other Ambulatory Visit: Payer: Self-pay

## 2023-09-18 DIAGNOSIS — Z419 Encounter for procedure for purposes other than remedying health state, unspecified: Secondary | ICD-10-CM | POA: Diagnosis not present

## 2023-09-28 ENCOUNTER — Other Ambulatory Visit: Payer: Self-pay

## 2023-10-08 ENCOUNTER — Other Ambulatory Visit (HOSPITAL_COMMUNITY): Payer: Self-pay

## 2023-10-08 ENCOUNTER — Other Ambulatory Visit: Payer: Self-pay

## 2023-10-09 ENCOUNTER — Other Ambulatory Visit: Payer: Self-pay

## 2023-10-13 ENCOUNTER — Other Ambulatory Visit: Payer: Self-pay

## 2023-10-14 ENCOUNTER — Other Ambulatory Visit: Payer: Self-pay

## 2023-10-21 ENCOUNTER — Other Ambulatory Visit (HOSPITAL_COMMUNITY): Payer: Self-pay

## 2023-10-21 ENCOUNTER — Other Ambulatory Visit: Payer: Self-pay

## 2023-10-21 NOTE — Progress Notes (Signed)
 Specialty Pharmacy Refill Coordination Note  Sandra [name] is a 68 y.o. female contacted today regarding refills of specialty medication(s) Bictegravir-Emtricitab-Tenofov (Biktarvy )   Patient requested Delivery   Delivery date: 10/23/23   Verified address: [address]  [place]   [address]   Medication will be filled on 10/22/23.

## 2023-10-22 ENCOUNTER — Other Ambulatory Visit: Payer: Self-pay

## 2023-11-04 ENCOUNTER — Other Ambulatory Visit: Payer: Self-pay

## 2023-11-05 ENCOUNTER — Other Ambulatory Visit: Payer: Self-pay

## 2023-11-12 ENCOUNTER — Other Ambulatory Visit: Payer: Self-pay

## 2023-11-13 ENCOUNTER — Other Ambulatory Visit: Payer: Self-pay

## 2023-11-13 NOTE — Progress Notes (Signed)
 Specialty Pharmacy Ongoing Clinical Assessment Note  Sandra [name] is a 68 y.o. female who is being followed by the specialty pharmacy service for RxSp HIV   Patient's specialty medication(s) reviewed today: Bictegravir-Emtricitab-Tenofov (Biktarvy )   Missed doses in the last 4 weeks: 0   Patient/Caregiver did not have any additional questions or concerns.   Therapeutic benefit summary: Patient is achieving benefit   Adverse events/side effects summary: No adverse events/side effects   Patient's therapy is appropriate to: Continue    Goals Addressed             This Visit's Progress    Achieve Undetectable HIV Viral Load < 20   Worsening    Patient is not on track and worsening. Patient will work on increased adherence. Patient's viral load increased to 87 copies/mL as of 07/28/23         Follow up: 3 months  [facility] Specialty Pharmacist

## 2023-11-13 NOTE — Progress Notes (Signed)
 Specialty Pharmacy Refill Coordination Note  Spoke to patient's daughter, Sandra [name] is a 68 y.o. female contacted today regarding refills of specialty medication(s) Bictegravir-Emtricitab-Tenofov (Biktarvy )   Patient requested Delivery   Delivery date: 11/18/23   Verified address: [address]  [place]  [address]   Medication will be filled on: 11/17/23

## 2023-11-16 ENCOUNTER — Other Ambulatory Visit: Payer: Self-pay

## 2023-11-24 ENCOUNTER — Ambulatory Visit: Admitting: Infectious Diseases

## 2023-11-26 ENCOUNTER — Other Ambulatory Visit: Payer: Self-pay

## 2023-11-27 ENCOUNTER — Other Ambulatory Visit: Payer: Self-pay

## 2023-11-27 ENCOUNTER — Encounter: Payer: Self-pay | Admitting: Infectious Diseases

## 2023-11-27 ENCOUNTER — Ambulatory Visit: Admitting: Infectious Diseases

## 2023-11-27 VITALS — BP 166/91 | HR 64 | Temp 97.1°F | Resp 16 | Wt 136.0 lb

## 2023-11-27 DIAGNOSIS — I10 Essential (primary) hypertension: Secondary | ICD-10-CM

## 2023-11-27 DIAGNOSIS — B20 Human immunodeficiency virus [HIV] disease: Secondary | ICD-10-CM

## 2023-11-27 DIAGNOSIS — Z79899 Other long term (current) drug therapy: Secondary | ICD-10-CM | POA: Diagnosis not present

## 2023-11-27 DIAGNOSIS — Z23 Encounter for immunization: Secondary | ICD-10-CM | POA: Diagnosis not present

## 2023-11-27 DIAGNOSIS — E039 Hypothyroidism, unspecified: Secondary | ICD-10-CM

## 2023-11-27 DIAGNOSIS — Z1231 Encounter for screening mammogram for malignant neoplasm of breast: Secondary | ICD-10-CM | POA: Diagnosis not present

## 2023-11-27 MED ORDER — BIKTARVY 50-200-25 MG PO TABS: 1.0000 | ORAL_TABLET | Freq: Every day | ORAL | 3 refills | Status: DC

## 2023-11-27 MED ORDER — ZOSTER VAC RECOMB ADJUVANTED 50 MCG/0.5ML IM SUSR: 0.5000 mL | Freq: Once | INTRAMUSCULAR | 0 refills | Status: AC

## 2023-11-27 NOTE — Patient Instructions (Addendum)
  Bring all of your medications to this appointment - if you have a pill tray please bring that too.   Please call the [facility] to set up your mammogram.  [phone] [address], [address] [place], [place] [address]    VISIT SUMMARY:  Today, you came in for your routine follow-up care. We reviewed your current medications and health status, including your HIV management, blood pressure, thyroid  function, and recent Pap smear results. We also discussed your need for certain vaccines and a mammogram.  YOUR PLAN:  -HUMAN IMMUNODEFICIENCY VIRUS (HIV) DISEASE: Your HIV is well-controlled with an undetectable viral load for almost a year. Your last CD4 count was low at 183. Continue taking Biktarvy  and Bactrim  as prescribed.  -ESSENTIAL HYPERTENSION: Your blood pressure remains high, and you reported experiencing heart palpitations and heaviness, possibly related to your medication. High blood pressure can increase the risk of heart disease and stroke. We scheduled an appointment with a pharmacist to review your medications and discussed dietary and lifestyle changes to help manage your blood pressure. Please reduce your salt intake, consider adding beet root to your diet, reduce caffeine and alcohol intake, and increase your physical activity. Your follow-up appointment with the pharmacist is on December 3rd at 9:15 AM.  -HYPOTHYROIDISM: Your thyroid  function is stable with a TSH level of 1.06 on your current dose of Synthroid . Hypothyroidism means your thyroid  gland is underactive. Continue taking Synthroid  25 mcg daily.  -IMMUNIZATIONS (INFLUENZA AND SHINGLES): You are due for your influenza and shingles vaccines. You received the first dose of the shingles vaccine in July. We plan to give you the second dose of the shingles vaccine in December.  -SCREENING MAMMOGRAM FOR BREAST CANCER: You are due for a routine screening mammogram to check for breast cancer. We have ordered  the mammogram and provided you with the contact information to schedule it.  -ABNORMAL CERVICAL CYTOLOGY WITH HIGH RISK HPV AND ASCUS: Your last Pap smear showed high-risk HPV and ASCUS, which means there are abnormal cells on your cervix that could potentially lead to cancer. You need to follow up with gynecology for a repeat Pap smear in June 2026.  INSTRUCTIONS:  Please follow up with the pharmacist on December 3rd at 9:15 AM to review your medication regimen. Schedule your mammogram as soon as possible. Plan to receive your second dose of the shingles vaccine in December. Ensure you follow up with gynecology for a repeat Pap smear in June 2026.

## 2023-11-27 NOTE — Progress Notes (Signed)
 Name: Sandra [name]  DOB: 08/14/1955 MRN: [id number] PCP: Jaycee Greig PARAS, NP    Subjective:   Chief Complaint  Patient presents with   Follow-up    B20      Discussed the use of AI scribe software for clinical note transcription with the patient, who gave verbal consent to proceed.   History of Present Illness   Sandra [name] is a 68 year old female with HIV who presents for routine follow-up care.   She has been taking Biktarvy , and her viral load has dropped to 87 copies, remaining in the undetectable range for almost a year. Her last CD4 count was 183, and she continues on Bactrim  prophylaxis. She has not followed up with her primary care team and is due for several vaccines including tetanus, flu, COVID, and the second dose of Shingrix . She also needs a mammogram.  She takes five medications, including Biktarvy , Synthroid , Crestor , amlodipine , and Bactrim . She is on Synthroid  25 mcg daily, with a TSH level of 1.06. She experiences a fast and heavy heartbeat when taking her blood pressure medication, which she takes inconsistently. Her blood pressure remains high, and she has a history of stroke.  In March, a Pap smear showed high-risk HPV and ASCUS, but she did not attend her follow-up with gynecology. She recalls that her gynecologist recommended a repeat Pap test in June 2026.  She does not smoke or consume alcohol. She does not engage in regular physical activity, often sitting for long periods, and her caregiver encourages her to walk more.       Review of Systems  Constitutional:  Negative for chills and fever.  HENT:  Negative for tinnitus.   Eyes:  Negative for blurred vision and photophobia.  Respiratory:  Negative for cough and sputum production.   Cardiovascular:  Negative for chest pain.  Gastrointestinal:  Negative for diarrhea, nausea and vomiting.  Genitourinary:  Negative for dysuria.  Skin:  Negative for rash.  Neurological:  Negative for headaches.      Past Medical History:  Diagnosis Date   Arthritis    Headache    HIV (human immunodeficiency virus infection) (HCC) 01/19/2019   Hypertension    Hypothyroidism 07/23/2021   Prediabetes 07/23/2021   Stroke [facility])     Outpatient Medications Prior to Visit  Medication Sig Dispense Refill   amLODipine  (NORVASC ) 10 MG tablet Take 1 tablet (10 mg total) by mouth daily. 90 tablet 3   aspirin  EC 81 MG tablet Take 1 tablet (81 mg total) by mouth daily. Swallow whole. 90 tablet 3   ezetimibe  (ZETIA ) 10 MG tablet Take 1 tablet (10 mg total) by mouth daily. 90 tablet 3   levothyroxine  (SYNTHROID ) 25 MCG tablet Take 1 tablet (25 mcg total) by mouth daily. 90 tablet 3   rosuvastatin  (CRESTOR ) 40 MG tablet Take 1 tablet (40 mg total) by mouth daily. 90 tablet 3   sulfamethoxazole -trimethoprim  (BACTRIM ) 400-80 MG tablet Take 1 tablet by mouth daily. 90 tablet 3   valsartan -hydrochlorothiazide  (DIOVAN -HCT) 80-12.5 MG tablet Take 1 tablet by mouth daily. 90 tablet 3   bictegravir-emtricitabine -tenofovir  AF (BIKTARVY ) 50-200-25 MG TABS tablet Take 1 tablet by mouth daily. 90 tablet 3   No facility-administered medications prior to visit.     No Known Allergies   Social History   Tobacco Use   Smoking status: Never    Passive exposure: Never   Smokeless tobacco: Never  Vaping Use   Vaping status: Never Used  Substance Use Topics  Alcohol use: Never   Drug use: Never     Social History   Substance and Sexual Activity  Sexual Activity Not Currently     Objective:   Vitals:   11/27/23 0931  BP: (!) 166/91  Pulse: 64  Resp: 16  Temp: (!) 97.1 F (36.2 C)  TempSrc: Oral  SpO2: 99%  Weight: 136 lb (61.7 kg)     Body mass index is 24.09 kg/m.   Physical Exam Constitutional:      Appearance: Normal appearance. She is not ill-appearing.  HENT:     Mouth/Throat:     Mouth: Mucous membranes are moist.     Pharynx: Oropharynx is clear.  Eyes:     General: No  scleral icterus. Cardiovascular:     Rate and Rhythm: Normal rate and regular rhythm.  Pulmonary:     Effort: Pulmonary effort is normal.  Skin:    General: Skin is warm.  Neurological:     Mental Status: She is oriented to person, place, and time.  Psychiatric:        Mood and Affect: Mood normal.        Thought Content: Thought content normal.     Lab Results Lab Results  Component Value Date   WBC 4.6 03/20/2023   HGB 13.8 03/20/2023   HCT 41.6 03/20/2023   MCV 91.8 03/20/2023   PLT 222 03/20/2023    Lab Results  Component Value Date   CREATININE 1.02 03/20/2023   BUN 10 03/20/2023   NA 139 03/20/2023   K 3.7 03/20/2023   CL 103 03/20/2023   CO2 29 03/20/2023    Lab Results  Component Value Date   ALT 9 03/20/2023   AST 15 03/20/2023   ALKPHOS 46 07/22/2021   BILITOT 0.6 03/20/2023    Lab Results  Component Value Date   CHOL 258 (H) 03/20/2023   HDL 48 (L) 03/20/2023   LDLCALC 178 (H) 03/20/2023   TRIG 168 (H) 03/20/2023   CHOLHDL 5.4 (H) 03/20/2023   HIV 1 RNA Quant  Date Value  07/28/2023 87 copies/mL (H)  03/20/2023 22 copies/mL (H)  11/20/2022 9,620 Copies/mL (H)   CD4 T Cell Abs (/uL)  Date Value  11/20/2022 219 (L)  07/01/2022 205 (L)  03/17/2022 133 (L)     Assessment & Plan:  Assessment and Plan    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 Last CD4 count was 183. Continues on Bactrim  prophylaxis. - Continue Biktarvy  and Bactrim  prophylaxis - VL and CD4 today  - update vaccines   Essential hypertension - Blood pressure remains very high. Reports heart palpitations and heaviness, possibly related to medication regimen. Current regimen includes amlodipine  and other medications. Discussed dietary modifications and lifestyle changes to aid in blood pressure control. - Scheduled appointment with pharmacist to review and organize  medication regimen. - Encouraged dietary modifications including reducing salt intake and considering beet root for nitric oxide benefits. - Advised on lifestyle changes such as reducing caffeine and alcohol intake and increasing physical activity. - Scheduled follow-up appointment with pharmacist on December 3rd at 9:15 AM.  Hypothyroidism - TSH level is 1.06 on current dose of Synthroid  25 mcg daily. - Continue Synthroid  25 mcg daily.  Immunizations (influenza and shingles) Due for influenza and shingles vaccines. Received first dose of shingles vaccine in July. - Plan for second dose of shingles vaccine in December.  Screening mammogram for breast cancer Due for routine screening mammogram. - Ordered mammogram and provided contact information for scheduling.  Abnormal cervical cytology with high risk HPV and ASCUS Previous Pap smear showed high risk HPV and ASCUS. Follow-up with gynecology was not completed. Recommended repeat Pap smear in one year, due June 2026. - Ensure follow-up with gynecology for repeat Pap smear in June 2026.          Meds ordered this encounter  Medications   Zoster Vaccine Adjuvanted (SHINGRIX ) injection    Sig: Inject 0.5 mLs into the muscle once for 1 dose.    Dispense:  0.5 mL    Refill:  0   bictegravir-emtricitabine -tenofovir  AF (BIKTARVY ) 50-200-25 MG TABS tablet    Sig: Take 1 tablet by mouth daily.    Dispense:  90 tablet    Refill:  3    Patient would like blister packed medications 90 day supply if possible    Prescription Type::   New Therapy - New Program   Orders Placed This Encounter  Procedures   MM 3D SCREENING MAMMOGRAM BILATERAL BREAST    Standing Status:   Future    Expiration Date:   11/26/2024    Reason for Exam (SYMPTOM  OR DIAGNOSIS REQUIRED):   breast cancer screening    Preferred [facility]   Flu vaccine HIGH DOSE PF(Fluzone Trivalent)   T-helper cells (CD4) count   HIV 1 RNA quant-no  reflex-bld    RTC in 3-4 months after visit with pharmacy team on Dec 3rd to help with medication review    Corean Fireman, MSN, NP-C [facility] for Infectious Disease [facility] Pager: [phone] Office: [phone]  11/27/23  11:33 AM

## 2023-11-30 ENCOUNTER — Other Ambulatory Visit: Payer: Self-pay

## 2023-12-01 ENCOUNTER — Ambulatory Visit: Payer: Self-pay | Admitting: Infectious Diseases

## 2023-12-01 LAB — T-HELPER CELLS (CD4) COUNT (NOT AT ARMC)
Absolute CD4: 306 {cells}/uL — ABNORMAL LOW (ref 490–1740)
CD4 T Helper %: 10 % — ABNORMAL LOW (ref 30–61)
Total lymphocyte count: 3164 {cells}/uL (ref 850–3900)

## 2023-12-01 LAB — HIV-1 RNA QUANT-NO REFLEX-BLD
HIV 1 RNA Quant: NOT DETECTED {copies}/mL
HIV-1 RNA Quant, Log: NOT DETECTED {Log_copies}/mL

## 2023-12-01 NOTE — Telephone Encounter (Signed)
 Informed patient sister Angeline of results.SABRA Annabella SHAUNNA Dalila, CMA

## 2023-12-01 NOTE — Telephone Encounter (Signed)
-----   Message from [place] sent at 12/01/2023  7:17 AM EST ----- Please call Angeline - Suzann's contact to let them both know that [facility] labs look amazing. Her CD4 has finally showed improvement. It is up to over 300 now. Her viral load remains undetectable  No changes in her medications for now.  ----- Message ----- From: Interface, [facility] Lab Results In Sent: 11/29/2023   2:35 PM EST To: Corean LOISE Fireman, NP

## 2023-12-02 NOTE — Progress Notes (Signed)
 The ASCVD Risk score (Arnett DK, et al., 2019) failed to calculate for the following reasons:   Risk score cannot be calculated because patient has a medical history suggesting prior/existing ASCVD  Arlon Bergamo, BSN, RN

## 2023-12-09 ENCOUNTER — Ambulatory Visit (INDEPENDENT_AMBULATORY_CARE_PROVIDER_SITE_OTHER): Admitting: Pharmacist

## 2023-12-09 ENCOUNTER — Other Ambulatory Visit: Payer: Self-pay

## 2023-12-09 DIAGNOSIS — Z713 Dietary counseling and surveillance: Secondary | ICD-10-CM

## 2023-12-09 DIAGNOSIS — Z21 Asymptomatic human immunodeficiency virus [HIV] infection status: Secondary | ICD-10-CM

## 2023-12-09 DIAGNOSIS — Z9189 Other specified personal risk factors, not elsewhere classified: Secondary | ICD-10-CM | POA: Diagnosis not present

## 2023-12-09 NOTE — Progress Notes (Signed)
 HPI: Sandra [name] is a 68 y.o. female who presents to the [facility] pharmacy clinic for HIV follow-up.  Referring ID Provider: Corean Fireman, NP  Patient Active Problem List   Diagnosis Date Noted   Language barrier 05/20/2023   Polypharmacy 03/17/2022   H/O LEEP 02/12/2022   Healthcare maintenance 09/02/2021   Hypothyroidism 07/23/2021   Prediabetes 07/23/2021   Pedal edema 04/17/2020   Chronic pain of left knee 05/16/2019   HIV (human immunodeficiency virus infection) (HCC) 01/19/2019   AIDS (acquired immune deficiency syndrome) (HCC) 11/30/2017   CVA (cerebral vascular accident) (HCC) 11/25/2017   Stroke (HCC) 12/08/2014   Left-sided weakness    HLD (hyperlipidemia)    Essential hypertension    Cerebrovascular accident (CVA) due to occlusion of cerebral artery (HCC)     Patient's Medications  New Prescriptions   No medications on file  Previous Medications   AMLODIPINE  (NORVASC ) 10 MG TABLET    Take 1 tablet (10 mg total) by mouth daily.   ASPIRIN  EC 81 MG TABLET    Take 1 tablet (81 mg total) by mouth daily. Swallow whole.   BICTEGRAVIR-EMTRICITABINE -TENOFOVIR  AF (BIKTARVY ) 50-200-25 MG TABS TABLET    Take 1 tablet by mouth daily.   EZETIMIBE  (ZETIA ) 10 MG TABLET    Take 1 tablet (10 mg total) by mouth daily.   LEVOTHYROXINE  (SYNTHROID ) 25 MCG TABLET    Take 1 tablet (25 mcg total) by mouth daily.   ROSUVASTATIN  (CRESTOR ) 40 MG TABLET    Take 1 tablet (40 mg total) by mouth daily.   SULFAMETHOXAZOLE -TRIMETHOPRIM  (BACTRIM ) 400-80 MG TABLET    Take 1 tablet by mouth daily.   VALSARTAN -HYDROCHLOROTHIAZIDE  (DIOVAN -HCT) 80-12.5 MG TABLET    Take 1 tablet by mouth daily.  Modified Medications   No medications on file  Discontinued Medications   No medications on file    Labs: Lab Results  Component Value Date   HIV1RNAQUANT NOT DETECTED 11/27/2023   HIV1RNAQUANT 87 (H) 07/28/2023   HIV1RNAQUANT 22 (H) 03/20/2023   CD4TABS 219 (L) 11/20/2022   CD4TABS 205 (L)  07/01/2022   CD4TABS 133 (L) 03/17/2022    RPR and STI Lab Results  Component Value Date   LABRPR NON-REACTIVE 03/20/2023   LABRPR NON-REACTIVE 02/02/2020   LABRPR NON-REACTIVE 11/23/2018   LABRPR Non Reactive 11/27/2017    STI Results GC CT  05/19/2023  9:07 AM Negative  Negative   09/04/2021  9:59 AM Negative  Negative   11/23/2018 12:11 PM Negative  Negative     Hepatitis B Lab Results  Component Value Date   HEPBSAG Negative 11/27/2017   Hepatitis C No results found for: HEPCAB, HCVRNAPCRQN Hepatitis A Lab Results  Component Value Date   HAV Positive (A) 11/27/2017   Lipids: Lab Results  Component Value Date   CHOL 258 (H) 03/20/2023   TRIG 168 (H) 03/20/2023   HDL 48 (L) 03/20/2023   CHOLHDL 5.4 (H) 03/20/2023   VLDL 15 11/26/2017   LDLCALC 178 (H) 03/20/2023    Current HIV Regimen: Biktarvy   Assessment: Chrisa comes in today to follow up on her medications. She is accompanied by her son-in-law, Gwenn, who interprets for her.   She brought all of her medication with her today in the form of the pill packs that [facility] for her. She is complaining of chest pain that happens when she takes her medications. She has skipped her doses the last 2 days and has not had any  chest pain. She attributes it to when she takes them. She does not have difficulty breathing or severe pain. She describes it as a burning sensation and states that it resolves with drinking water. After some investigation, I believe she is having heartburn. Asked about diet. Gwenn states that they cook every day at home, and Jakerra does not eat much fast food or processed food. They do, however, cook with tomatoes a lot and much of their food is spicy. I asked her to make a note and write down the episodes that she has heartburn and what foods she has eaten that day. I advised against very spicy foods, chocolate, and acidic food to start. She agreed to  write down her diet.   Her blood pressure is 190/91 today. She has not taken her BP medications in 2 days. Stressed the importance of this and that she needs to take better control of it, especially with her history of stroke. Also advised her to decrease her salt intake. Gwenn states that they do use a bit of salt when cooking, so I asked that they decrease that amount in [facility] food and that she not add any additional salt to her food once it is cooked. She also does not exercise. Asked her to start walking - even if it is just around her living room. Also explained that she can step side to side while watching TV to get some movement as well. I encouraged her to get some light weights and use them while she is walking or sitting. Even told her that she can use household items like soup cans.  After discussion, she agreed to start trying to exercise more, write down her diet when she has heartburn, and make sure she is taking her medications daily. Also asked her to have something on her stomach in the mornings when she takes her medications as taking them on an empty stomach can also cause acid reflux. I also asked if she would write down her BP readings as well. Gwenn stated that he will help with this. I asked her to try and take note of these things over the next 6-8 weeks and come back to see Corean with her data. She agreed.  Labs:  None today  Eligible vaccinations:  Did not go into this today  Plan: - Continue prescribed medications and take daily - Monitor BP at home and write down results - Take note of diet when heartburn returns and write down the food items - Follow up with Corean on 01/19/24  Shailee Foots L. Macee Venables, PharmD, BCIDP, AAHIVP, CPP Clinical Pharmacist Practitioner - Infectious Diseases Clinical Pharmacist Lead - Specialty Pharmacy [facility] for Infectious Disease

## 2023-12-10 ENCOUNTER — Other Ambulatory Visit (HOSPITAL_COMMUNITY): Payer: Self-pay

## 2023-12-10 ENCOUNTER — Other Ambulatory Visit: Payer: Self-pay

## 2023-12-10 ENCOUNTER — Other Ambulatory Visit: Payer: Self-pay | Admitting: Infectious Diseases

## 2023-12-10 DIAGNOSIS — B20 Human immunodeficiency virus [HIV] disease: Secondary | ICD-10-CM

## 2023-12-10 MED ORDER — BIKTARVY 50-200-25 MG PO TABS
1.0000 | ORAL_TABLET | Freq: Every day | ORAL | 3 refills | Status: DC
  Filled 2023-12-10: qty 90, 90d supply, fill #0
  Filled 2023-12-14: qty 30, 30d supply, fill #0
  Filled 2024-01-06: qty 30, 30d supply, fill #1

## 2023-12-10 NOTE — Telephone Encounter (Signed)
 Spoke with patient's daughter Angeline ([email or web address]), they would like to continue using [facility]. Left voicemail for [facility] to cancel Biktarvy  Rx.  Kollyns Mickelson, BSN, RN

## 2023-12-14 ENCOUNTER — Other Ambulatory Visit: Payer: Self-pay

## 2023-12-14 ENCOUNTER — Other Ambulatory Visit: Payer: Self-pay | Admitting: Pharmacy Technician

## 2023-12-14 NOTE — Progress Notes (Signed)
 Specialty Pharmacy Refill Coordination Note  Sandra [name] is a 68 y.o. female contacted today regarding refills of specialty medication(s) Bictegravir-Emtricitab-Tenofov (Biktarvy )  Spoke with Daughter  Patient requested Delivery   Delivery date: 12/18/23   Verified address: [address]  [place]   Medication will be filled on: 12/17/23

## 2023-12-17 ENCOUNTER — Other Ambulatory Visit: Payer: Self-pay

## 2023-12-18 DIAGNOSIS — Z419 Encounter for procedure for purposes other than remedying health state, unspecified: Secondary | ICD-10-CM | POA: Diagnosis not present

## 2024-01-06 ENCOUNTER — Other Ambulatory Visit: Payer: Self-pay

## 2024-01-12 ENCOUNTER — Other Ambulatory Visit: Payer: Self-pay

## 2024-01-12 ENCOUNTER — Other Ambulatory Visit (HOSPITAL_COMMUNITY): Payer: Self-pay

## 2024-01-13 ENCOUNTER — Other Ambulatory Visit: Payer: Self-pay

## 2024-01-19 ENCOUNTER — Ambulatory Visit: Admitting: Infectious Diseases

## 2024-01-19 ENCOUNTER — Other Ambulatory Visit: Payer: Self-pay

## 2024-01-19 ENCOUNTER — Other Ambulatory Visit (HOSPITAL_COMMUNITY): Payer: Self-pay

## 2024-01-19 ENCOUNTER — Encounter: Payer: Self-pay | Admitting: Infectious Diseases

## 2024-01-19 VITALS — BP 171/81 | HR 66 | Temp 98.3°F | Ht 63.0 in | Wt 136.0 lb

## 2024-01-19 DIAGNOSIS — I10 Essential (primary) hypertension: Secondary | ICD-10-CM

## 2024-01-19 DIAGNOSIS — B20 Human immunodeficiency virus [HIV] disease: Secondary | ICD-10-CM | POA: Diagnosis not present

## 2024-01-19 DIAGNOSIS — Z21 Asymptomatic human immunodeficiency virus [HIV] infection status: Secondary | ICD-10-CM

## 2024-01-19 MED ORDER — BIKTARVY 50-200-25 MG PO TABS
1.0000 | ORAL_TABLET | Freq: Every day | ORAL | 3 refills | Status: AC
  Filled 2024-01-19: qty 90, 90d supply, fill #0

## 2024-01-19 MED ORDER — VALSARTAN-HYDROCHLOROTHIAZIDE 160-25 MG PO TABS
1.0000 | ORAL_TABLET | Freq: Every day | ORAL | 1 refills | Status: AC
  Filled 2024-01-19: qty 90, 90d supply, fill #0
  Filled 2024-02-11: qty 30, 30d supply, fill #0

## 2024-01-19 MED ORDER — ZOSTER VAC RECOMB ADJUVANTED 50 MCG/0.5ML IM SUSR
0.5000 mL | Freq: Once | INTRAMUSCULAR | 0 refills | Status: AC
  Filled 2024-01-19: qty 1, 1d supply, fill #0

## 2024-01-19 NOTE — Patient Instructions (Addendum)
" ° °  Increase your Diovan  (blood pressure pill) - will still be one tablet once a day but a higher dose   Will need to repeat your pap smear again at your next appointment    Can stop the bactrim  (antibiotic) - your immune system is stronger that you don't need it anymore.  "

## 2024-01-19 NOTE — Progress Notes (Signed)
 "  Name: Sandra [name]  DOB: Jan 20, 1955 MRN: [id number] PCP: Jaycee Greig PARAS, NP    Subjective:   Chief Complaint  Patient presents with   Follow-up     Discussed the use of AI scribe software for clinical note transcription with the patient, who gave verbal consent to proceed.   History of Present Illness   Sandra [name] is a 69 year old female with HIV who presents for a six-week follow-up.   Her recent blood work showed an undetectable viral load. She met with our pharmacy team to review her medications to help clarify things. Her current medication for HIV is Biktarvy , which has been refilled.  She has a history of hypertension and is currently taking amlodipine  10 mg and Diovan -HCTZ (80-12.5) Her nutrition and eating habits have been adjusted to help manage her symptoms, and she reports improvements.       Review of Systems  Constitutional:  Negative for chills and fever.  HENT:  Negative for tinnitus.   Eyes:  Negative for blurred vision and photophobia.  Respiratory:  Negative for cough and sputum production.   Cardiovascular:  Negative for chest pain.  Gastrointestinal:  Negative for diarrhea, nausea and vomiting.  Genitourinary:  Negative for dysuria.  Skin:  Negative for rash.  Neurological:  Negative for headaches.     Past Medical History:  Diagnosis Date   Arthritis    Headache    HIV (human immunodeficiency virus infection) (HCC) 01/19/2019   Hypertension    Hypothyroidism 07/23/2021   Prediabetes 07/23/2021   Stroke [facility])     Outpatient Medications Prior to Visit  Medication Sig Dispense Refill   amLODipine  (NORVASC ) 10 MG tablet Take 1 tablet (10 mg total) by mouth daily. 90 tablet 3   aspirin  EC 81 MG tablet Take 1 tablet (81 mg total) by mouth daily. Swallow whole. 90 tablet 3   ezetimibe  (ZETIA ) 10 MG tablet Take 1 tablet (10 mg total) by mouth daily. 90 tablet 3   levothyroxine  (SYNTHROID ) 25 MCG tablet Take 1 tablet (25 mcg total) by mouth  daily. 90 tablet 3   rosuvastatin  (CRESTOR ) 40 MG tablet Take 1 tablet (40 mg total) by mouth daily. 90 tablet 3   bictegravir-emtricitabine -tenofovir  AF (BIKTARVY ) 50-200-25 MG TABS tablet Take 1 tablet by mouth daily. 90 tablet 3   sulfamethoxazole -trimethoprim  (BACTRIM ) 400-80 MG tablet Take 1 tablet by mouth daily. 90 tablet 3   valsartan -hydrochlorothiazide  (DIOVAN -HCT) 80-12.5 MG tablet Take 1 tablet by mouth daily. 90 tablet 3   No facility-administered medications prior to visit.     No Known Allergies   Social History   Tobacco Use   Smoking status: Never    Passive exposure: Never   Smokeless tobacco: Never  Vaping Use   Vaping status: Never Used  Substance Use Topics   Alcohol use: Never   Drug use: Never     Social History   Substance and Sexual Activity  Sexual Activity Not Currently     Objective:   Vitals:   01/19/24 0931  BP: (!) 171/81  Pulse: 66  Temp: 98.3 F (36.8 C)  TempSrc: Oral  SpO2: 98%  Weight: 136 lb (61.7 kg)  Height: 5' 3 (1.6 m)     Body mass index is 24.09 kg/m.   Physical Exam Constitutional:      Appearance: Normal appearance. She is not ill-appearing.  HENT:     Mouth/Throat:     Mouth: Mucous membranes are moist.     Pharynx:  Oropharynx is clear.  Eyes:     General: No scleral icterus. Cardiovascular:     Rate and Rhythm: Normal rate and regular rhythm.  Pulmonary:     Effort: Pulmonary effort is normal.  Skin:    General: Skin is warm.  Neurological:     Mental Status: She is oriented to person, place, and time.  Psychiatric:        Mood and Affect: Mood normal.        Thought Content: Thought content normal.     Lab Results Lab Results  Component Value Date   WBC 4.6 03/20/2023   HGB 13.8 03/20/2023   HCT 41.6 03/20/2023   MCV 91.8 03/20/2023   PLT 222 03/20/2023    Lab Results  Component Value Date   CREATININE 1.02 03/20/2023   BUN 10 03/20/2023   NA 139 03/20/2023   K 3.7 03/20/2023    CL 103 03/20/2023   CO2 29 03/20/2023    Lab Results  Component Value Date   ALT 9 03/20/2023   AST 15 03/20/2023   ALKPHOS 46 07/22/2021   BILITOT 0.6 03/20/2023    Lab Results  Component Value Date   CHOL 258 (H) 03/20/2023   HDL 48 (L) 03/20/2023   LDLCALC 178 (H) 03/20/2023   TRIG 168 (H) 03/20/2023   CHOLHDL 5.4 (H) 03/20/2023   HIV 1 RNA Quant (copies/mL)  Date Value  11/27/2023 NOT DETECTED  07/28/2023 87 (H)  03/20/2023 22 (H)   CD4 T Cell Abs (/uL)  Date Value  11/20/2022 219 (L)  07/01/2022 205 (L)  03/17/2022 133 (L)     Assessment & Plan:     Human immunodeficiency virus (HIV) infection - HIV infection is well-controlled with an undetectable viral load and improving immune system. Bactrim  is no longer necessary due to immune recovery. CD4 was last > 300.  - Discontinued Bactrim  - Refilled Biktarvy   Hypertension - Blood pressure is elevated at 171 systolic. Current medications include amlodipine , Diovan , and HCTZ. Dietary changes have been beneficial.  - Increased Diovan  dosage to 160-25mg  daily. Discussed new  - Ordered kidney and liver function tests to monitor effects of blood pressure medication  Cervical cancer screening - Pap smear is due later this year. Potential need for gynecologist referral if persistent changes are noted. - Will plan Pap smear at next appointment - Will consider referral to gynecologist if persistent changes are noted  Shingles vaccination - Second dose of shingles vaccine is due. - Prescribed second dose of shingles vaccine - Advised to obtain vaccine at pharmacy in the building          Meds ordered this encounter  Medications   valsartan -hydrochlorothiazide  (DIOVAN  HCT) 160-25 MG tablet    Sig: Take 1 tablet by mouth daily.    Dispense:  90 tablet    Refill:  1   bictegravir-emtricitabine -tenofovir  AF (BIKTARVY ) 50-200-25 MG TABS tablet    Sig: Take 1 tablet by mouth daily.    Dispense:  90 tablet    Refill:   3    Patient would like blister packed medications 90 day supply if possible    Prescription Type::   Renewal   Zoster Vaccine Adjuvanted (SHINGRIX ) injection    Sig: Inject 0.5 mLs into the muscle once for 1 dose.    Dispense:  1 each    Refill:  0   Orders Placed This Encounter  Procedures   HIV 1 RNA quant-no reflex-bld   T-helper cells (CD4) count  COMPLETE METABOLIC PANEL WITHOUT GFR    Return in about 3 months (around 04/18/2024).    Corean Fireman, MSN, NP-C [facility] for Infectious Disease [facility] Pager: [phone] Office: [phone]  01/19/2024  2:02 PM   "

## 2024-01-20 LAB — T-HELPER CELLS (CD4) COUNT (NOT AT ARMC)
CD4 % Helper T Cell: 11 % — ABNORMAL LOW (ref 33–65)
CD4 T Cell Abs: 310 /uL — ABNORMAL LOW (ref 400–1790)

## 2024-01-21 LAB — HIV-1 RNA QUANT-NO REFLEX-BLD
HIV 1 RNA Quant: <20 {copies}/mL — AB
HIV-1 RNA Quant, Log: <1.3 {Log_copies}/mL — AB

## 2024-01-21 LAB — COMPLETE METABOLIC PANEL WITHOUT GFR
AG Ratio: 1.4 (calc) (ref 1.0–2.5)
ALT: 19 U/L (ref 6–29)
AST: 26 U/L (ref 10–35)
Albumin: 4.9 g/dL (ref 3.6–5.1)
Alkaline phosphatase (APISO): 59 U/L (ref 37–153)
BUN/Creatinine Ratio: 8 (calc) (ref 6–22)
BUN: 9 mg/dL (ref 7–25)
CO2: 28 mmol/L (ref 20–32)
Calcium: 10.3 mg/dL (ref 8.6–10.4)
Chloride: 99 mmol/L (ref 98–110)
Creat: 1.08 mg/dL — ABNORMAL HIGH (ref 0.50–1.05)
Globulin: 3.6 g/dL (ref 1.9–3.7)
Glucose, Bld: 84 mg/dL (ref 65–99)
Potassium: 4.4 mmol/L (ref 3.5–5.3)
Sodium: 136 mmol/L (ref 135–146)
Total Bilirubin: 1 mg/dL (ref 0.2–1.2)
Total Protein: 8.5 g/dL — ABNORMAL HIGH (ref 6.1–8.1)

## 2024-01-29 ENCOUNTER — Other Ambulatory Visit: Payer: Self-pay

## 2024-02-01 ENCOUNTER — Ambulatory Visit: Payer: Self-pay | Admitting: Infectious Diseases

## 2024-02-01 NOTE — Progress Notes (Signed)
 Please call her daughter Angeline ([email or web address]) to let her know her mom's labs:   Her viral load remains undetectable - very very proud of her!!  Her immune system has recovered nicely - very happy. I told her she can stop the bactrim  last time I saw her and she can stay off of it for sure.   No other changes from me - hopeful she got the new higher dose of her blood pressure medication with the next refill.   Corean

## 2024-02-02 ENCOUNTER — Other Ambulatory Visit: Payer: Self-pay | Admitting: Infectious Diseases

## 2024-02-02 DIAGNOSIS — Z21 Asymptomatic human immunodeficiency virus [HIV] infection status: Secondary | ICD-10-CM

## 2024-02-02 NOTE — Telephone Encounter (Signed)
 [facility], someone answered and put the call on hold 5+ minutes. Unable to connect with Angeline at this time.   Dalissa Lovin, BSN, RN

## 2024-02-05 NOTE — Telephone Encounter (Signed)
 [facility], no answer. Left HIPAA compliant voicemail requesting callback.   Ashutosh Dieguez, BSN, RN

## 2024-02-11 ENCOUNTER — Other Ambulatory Visit (HOSPITAL_COMMUNITY): Payer: Self-pay

## 2024-02-11 ENCOUNTER — Other Ambulatory Visit: Payer: Self-pay

## 2024-02-11 NOTE — Telephone Encounter (Signed)
 Third attempt to reach [facility], no answer. Left HIPAA compliant voicemail requesting callback.   Rockwell Zentz, BSN, RN

## 2024-02-12 ENCOUNTER — Other Ambulatory Visit: Payer: Self-pay

## 2024-04-14 ENCOUNTER — Ambulatory Visit: Payer: Self-pay | Admitting: Infectious Diseases
